# Patient Record
Sex: Female | Born: 1945 | Race: White | Hispanic: No | Smoking: Never smoker
Health system: Southern US, Community
[De-identification: ages and names within clinical notes are randomized; demographics above are authoritative.]

## PROBLEM LIST (undated history)

## (undated) DIAGNOSIS — I10 Essential (primary) hypertension: Secondary | ICD-10-CM

## (undated) DIAGNOSIS — F419 Anxiety disorder, unspecified: Secondary | ICD-10-CM

## (undated) DIAGNOSIS — E785 Hyperlipidemia, unspecified: Secondary | ICD-10-CM

## (undated) HISTORY — PX: TUBAL LIGATION: SHX77

## (undated) HISTORY — PX: BREAST SURGERY: SHX581

## (undated) HISTORY — DX: Hyperlipidemia, unspecified: E78.5

## (undated) HISTORY — PX: TONSILLECTOMY AND ADENOIDECTOMY: SUR1326

---

## 2005-05-30 ENCOUNTER — Ambulatory Visit: Payer: Self-pay

## 2005-10-04 ENCOUNTER — Ambulatory Visit: Payer: Self-pay | Admitting: Unknown Physician Specialty

## 2007-03-31 DIAGNOSIS — F32A Depression, unspecified: Secondary | ICD-10-CM | POA: Insufficient documentation

## 2007-04-22 ENCOUNTER — Ambulatory Visit: Payer: Self-pay

## 2008-04-01 ENCOUNTER — Observation Stay: Payer: Self-pay | Admitting: Internal Medicine

## 2008-04-01 ENCOUNTER — Other Ambulatory Visit: Payer: Self-pay

## 2008-04-22 ENCOUNTER — Ambulatory Visit: Payer: Self-pay

## 2008-05-25 LAB — HM DEXA SCAN: HM DEXA SCAN: NORMAL

## 2008-05-26 ENCOUNTER — Ambulatory Visit: Payer: Self-pay | Admitting: Family Medicine

## 2009-03-16 IMAGING — CT CT HEAD WITHOUT CONTRAST
2 series · 16 of 30 positions shown, 20 images · non-contrast
Comparison: none

REASON FOR EXAM: syncope
COMMENTS:

PROCEDURE:     CT  - CT HEAD WITHOUT CONTRAST  - April 01, 2008  [DATE]
RESULT:     Comparison: No available comparison exam.
Procedure: CT examination of the head was performed without intravenous
contrast. Collimation is 5 mm.

[Series 2: without · axial · non-contrast · 0.40mm/px · z∈[-142,-16]mm · 13 of 31 slices shown, 17 images]
[im 3/31  brain]
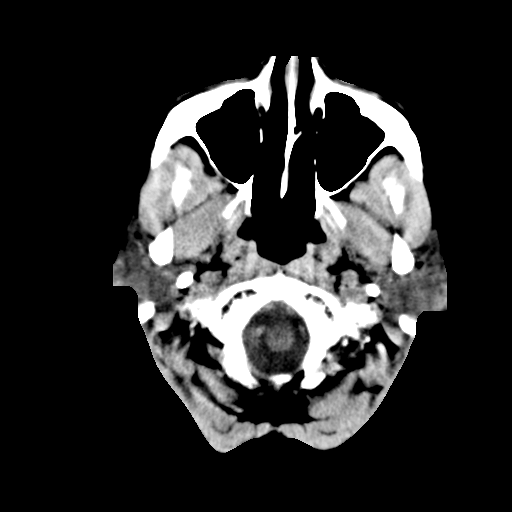
[im 3/31  bone]
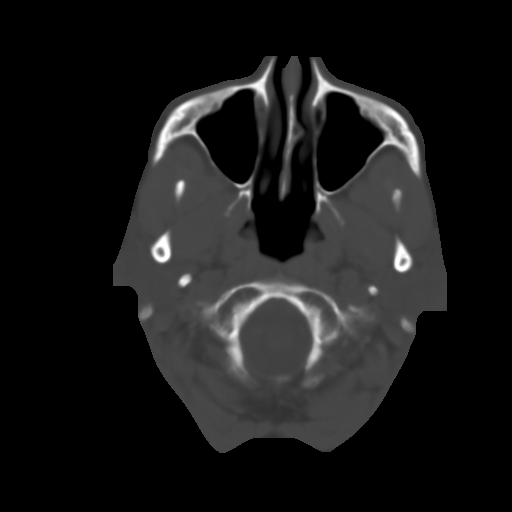
[im 5/31  brain]
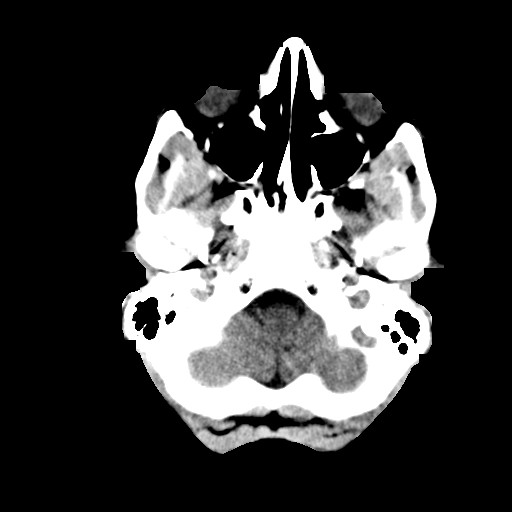
[im 7/31  brain]
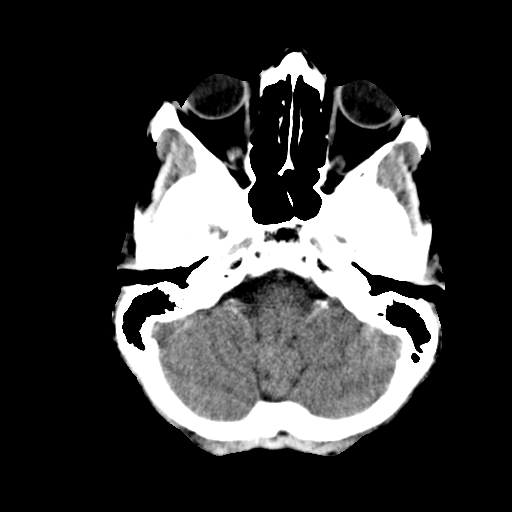
[im 9/31  brain]
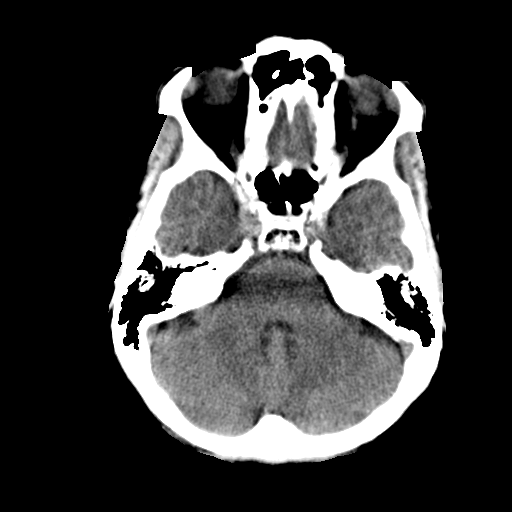
[im 11/31  brain]
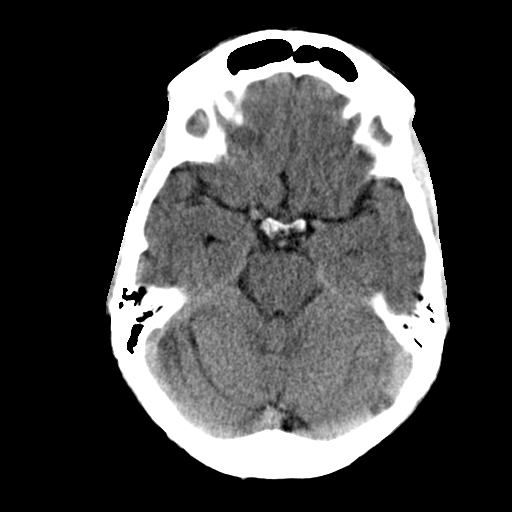
[im 11/31  bone]
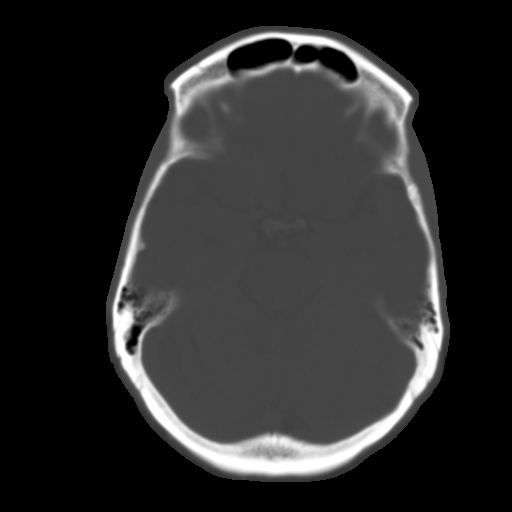
[im 13/31  brain]
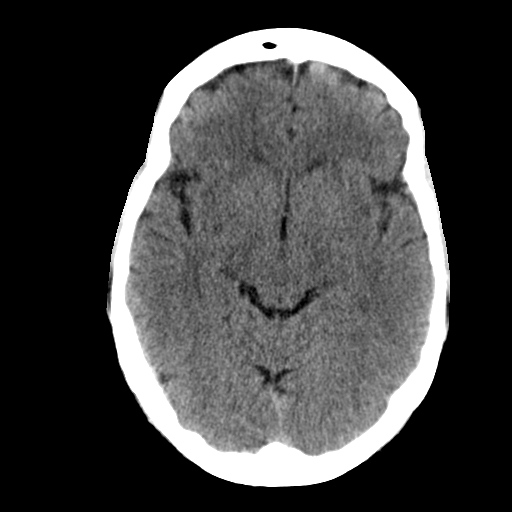
[im 16/31  brain]
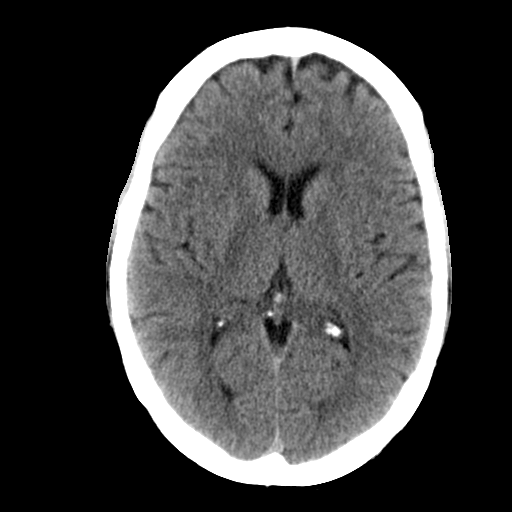
[im 18/31  brain]
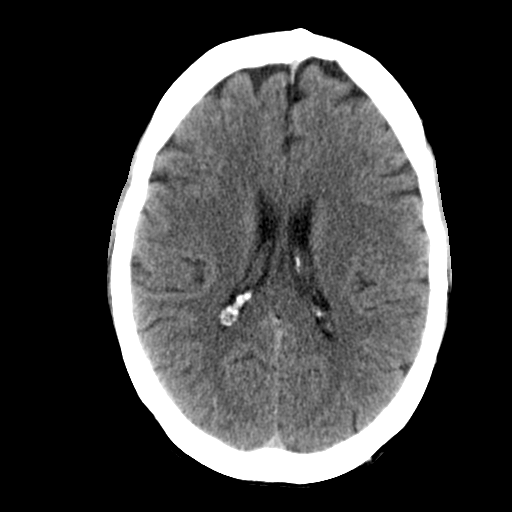
[im 20/31  brain]
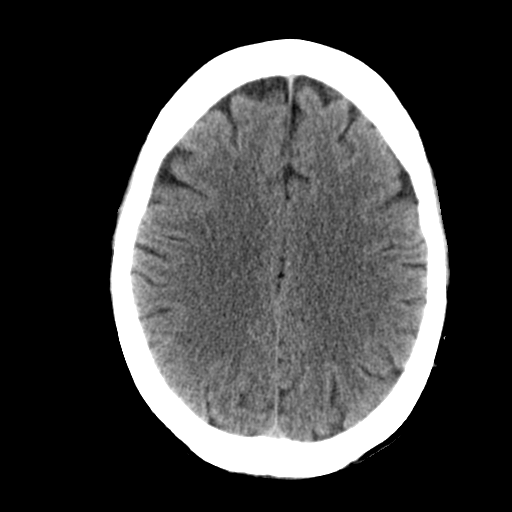
[im 20/31  bone]
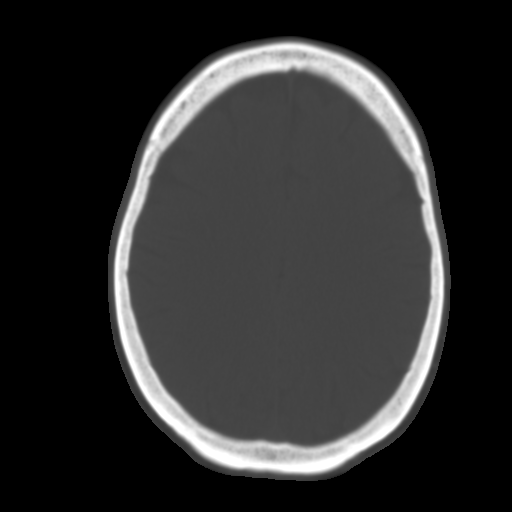
[im 22/31  brain]
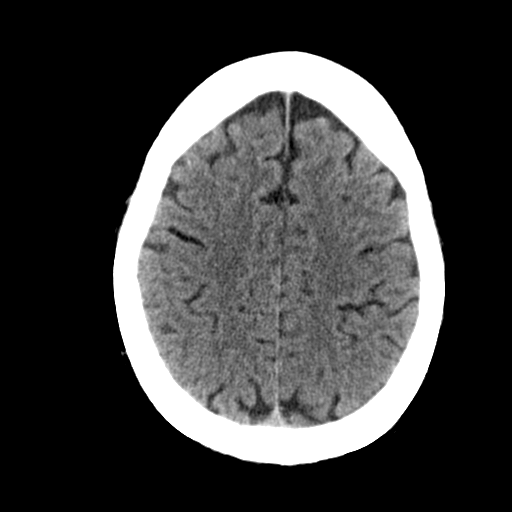
[im 24/31  brain]
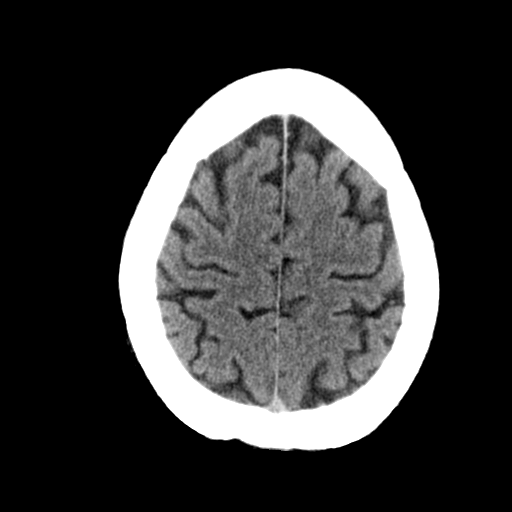
[im 26/31  brain]
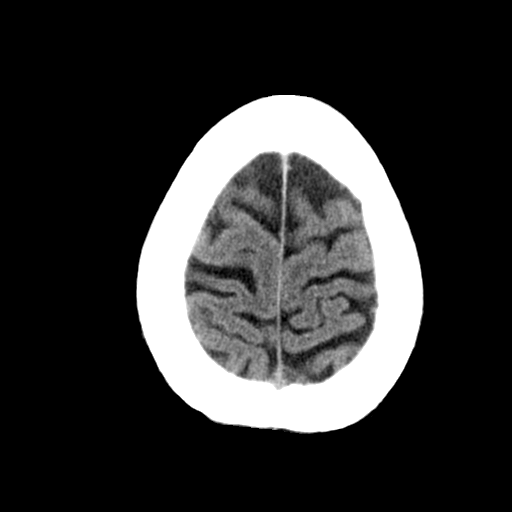
[im 28/31  brain]
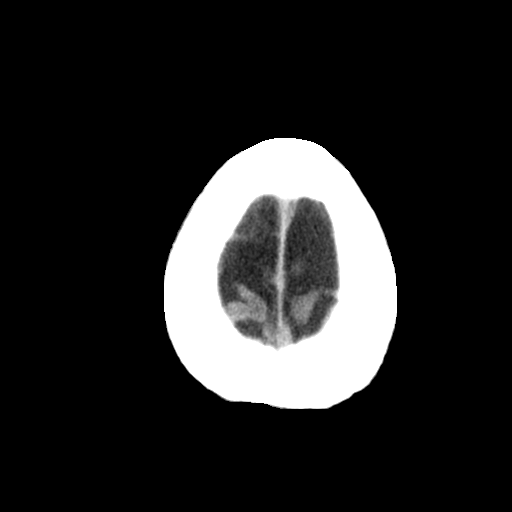
[im 28/31  bone]
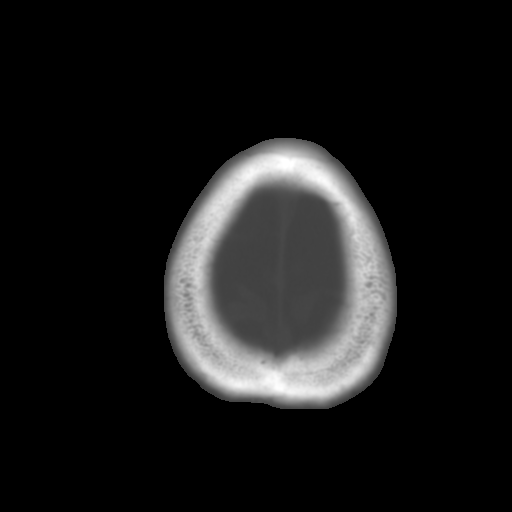

[Series 3: bone · axial · 0.39mm/px · z∈[-142,-102]mm · 3 of 31 slices shown]
[im 3/31  bone]
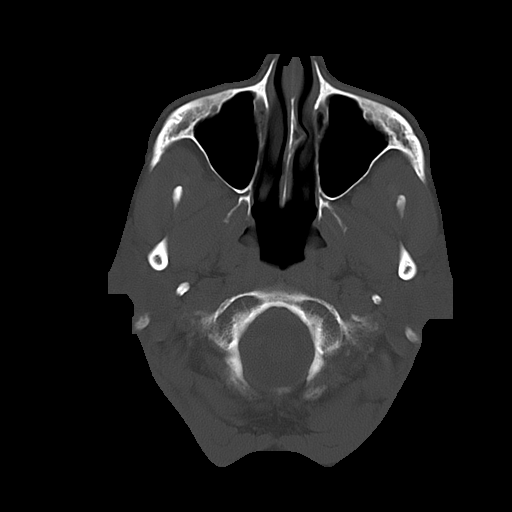
[im 7/31  bone]
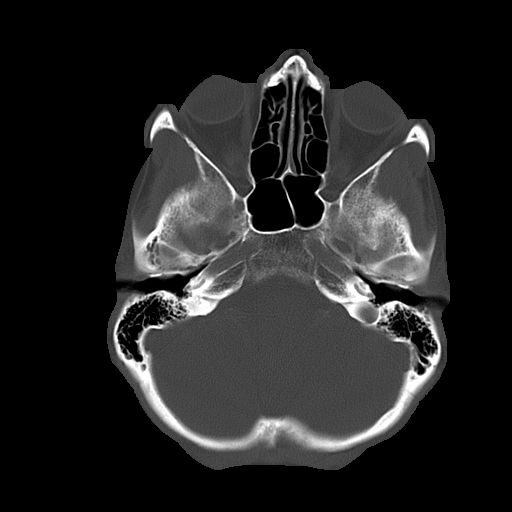
[im 11/31  bone]
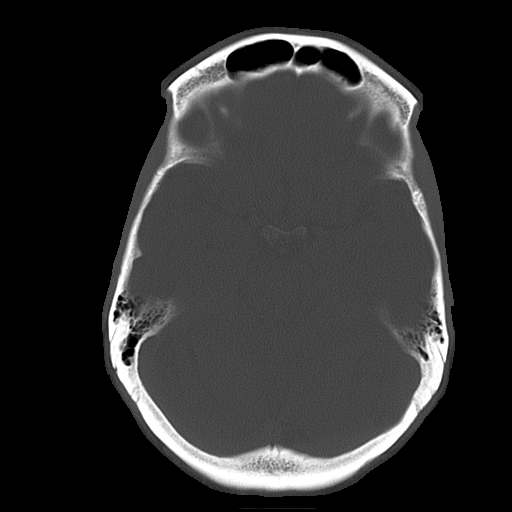

[16 of 30 positions shown; findings below may reference images not displayed]

FINDINGS: No evidence of intracranial hemorrhage, mass-effect, or ventricular
dilatation. The gray and white matters are differentiated. No displaced
calvarial fracture is noted. The partially visualized paranasal sinuses and
mastoid air cells are unremarkable.
IMPRESSION: 1. Unremarkable noncontrast CT of the head.

## 2009-03-16 IMAGING — CR DG CHEST 2V
1 series · 2 of 2 positions shown · non-contrast
Comparison: none

REASON FOR EXAM: Syncope
COMMENTS:

PROCEDURE:     DXR - DXR CHEST PA (OR AP) AND LATERAL  - April 01, 2008  [DATE]
RESULT:     The lung fields are clear. The heart, mediastinal and osseous
structures show no significant abnormalities.

[Series 1: view not recorded · 0.17mm/px · 2 of 2 slices shown]
[im 1/2]
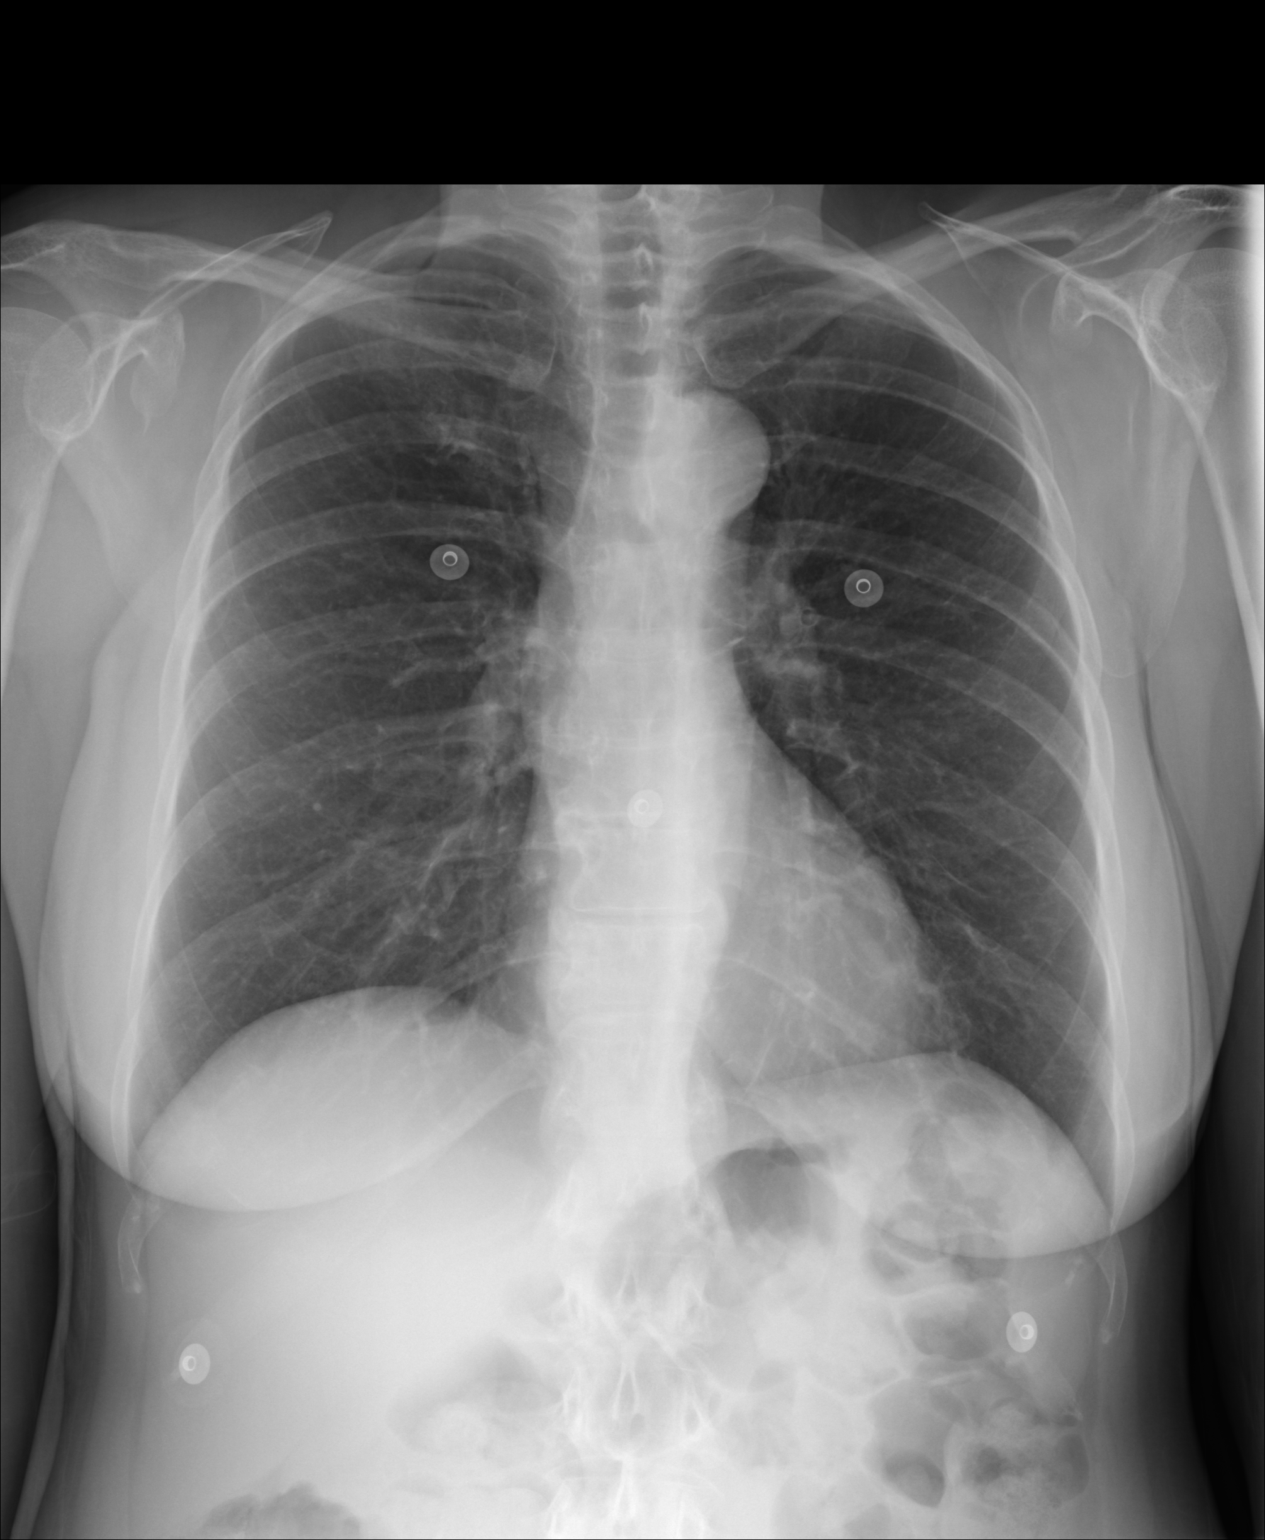
[im 2/2]
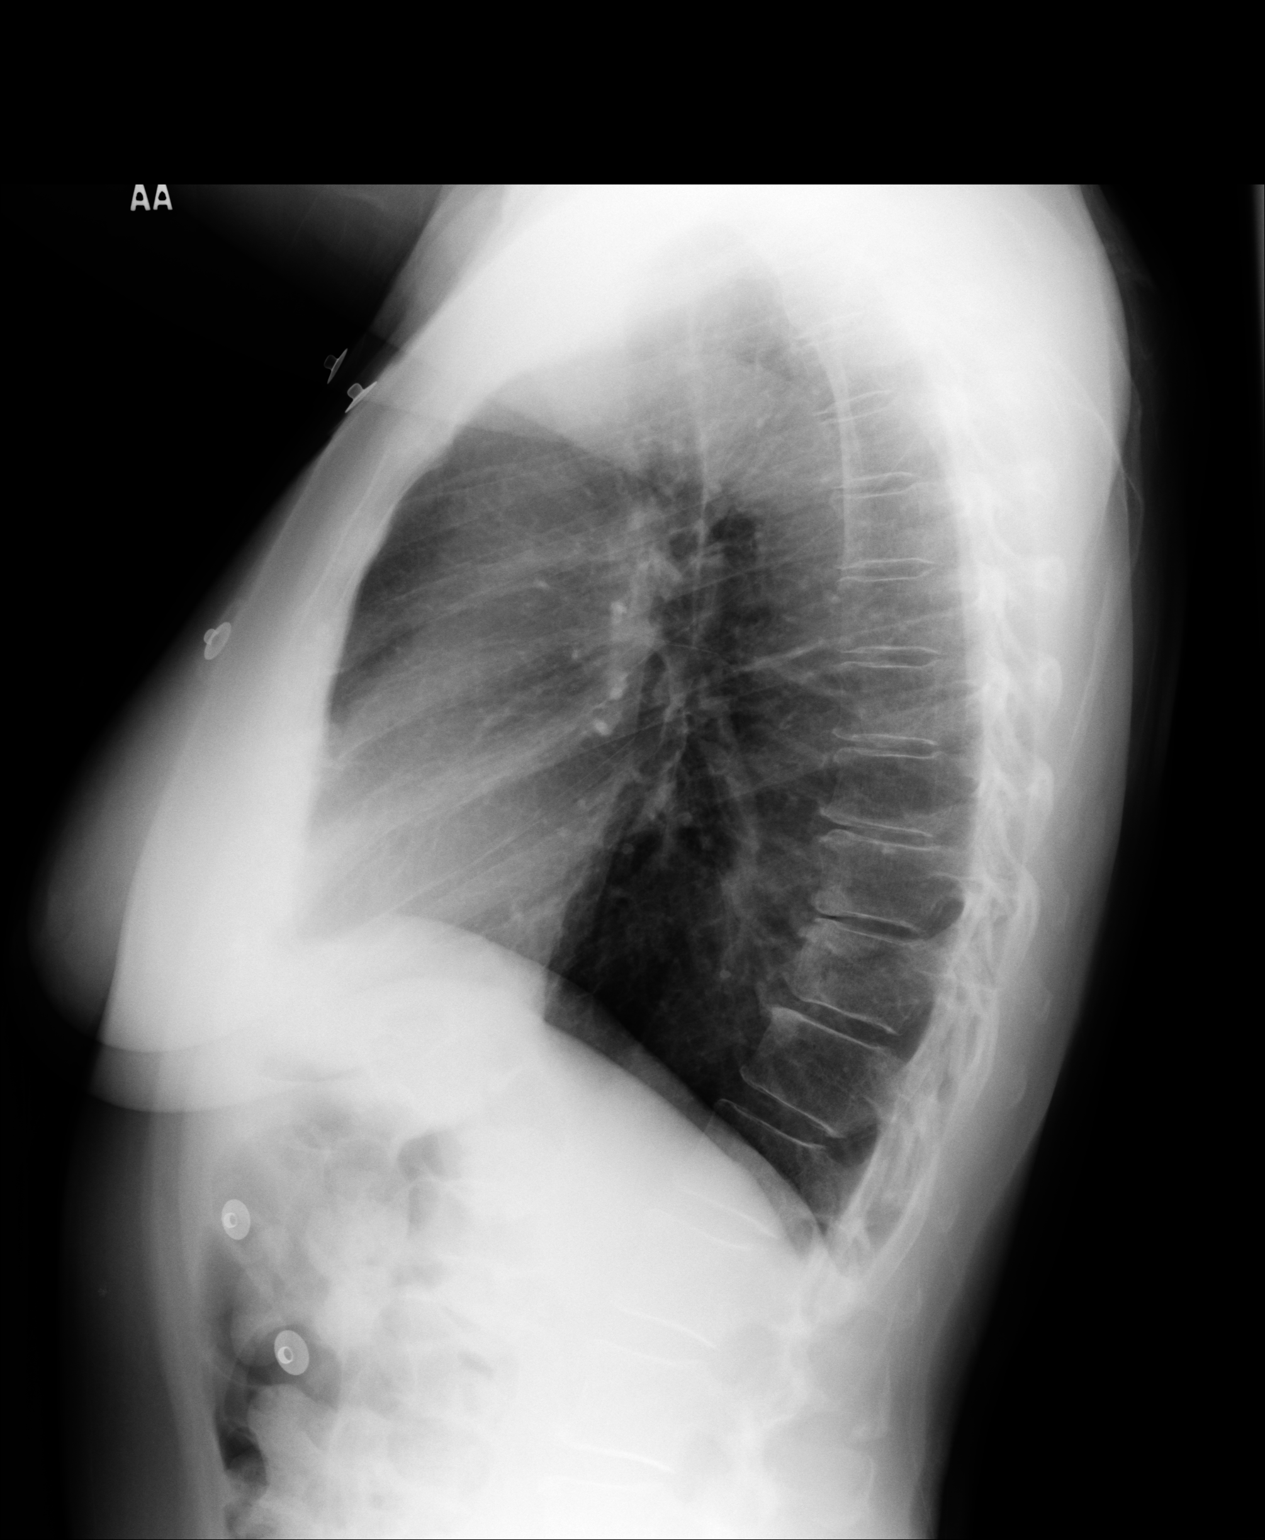

[2 of 2 positions shown; findings below may reference images not displayed]

IMPRESSION: No acute changes are identified.

## 2009-08-18 ENCOUNTER — Ambulatory Visit: Payer: Self-pay | Admitting: Family Medicine

## 2009-08-24 ENCOUNTER — Ambulatory Visit: Payer: Self-pay | Admitting: Family Medicine

## 2009-12-07 LAB — HM COLONOSCOPY

## 2010-11-26 ENCOUNTER — Ambulatory Visit: Payer: Self-pay | Admitting: Family Medicine

## 2012-07-21 ENCOUNTER — Ambulatory Visit: Payer: Self-pay | Admitting: Family Medicine

## 2012-07-23 ENCOUNTER — Ambulatory Visit: Payer: Self-pay | Admitting: Family Medicine

## 2013-09-08 ENCOUNTER — Ambulatory Visit: Payer: Self-pay | Admitting: Family Medicine

## 2013-09-08 LAB — HM MAMMOGRAPHY

## 2014-04-12 ENCOUNTER — Other Ambulatory Visit: Payer: Self-pay | Admitting: Family Medicine

## 2014-04-12 LAB — CBC WITH DIFFERENTIAL/PLATELET
Basophil #: 0 10*3/uL (ref 0.0–0.1)
Basophil %: 0.3 %
Eosinophil #: 0.1 10*3/uL (ref 0.0–0.7)
Eosinophil %: 1.4 %
HCT: 45.7 % (ref 35.0–47.0)
HGB: 15.1 g/dL (ref 12.0–16.0)
LYMPHS ABS: 1.1 10*3/uL (ref 1.0–3.6)
LYMPHS PCT: 18.1 %
MCH: 32 pg (ref 26.0–34.0)
MCHC: 33.1 g/dL (ref 32.0–36.0)
MCV: 97 fL (ref 80–100)
MONOS PCT: 4.2 %
Monocyte #: 0.3 x10 3/mm (ref 0.2–0.9)
Neutrophil #: 4.8 10*3/uL (ref 1.4–6.5)
Neutrophil %: 76 %
Platelet: 193 10*3/uL (ref 150–440)
RBC: 4.73 10*6/uL (ref 3.80–5.20)
RDW: 13.1 % (ref 11.5–14.5)
WBC: 6.3 10*3/uL (ref 3.6–11.0)

## 2014-04-12 LAB — COMPREHENSIVE METABOLIC PANEL
ALBUMIN: 4.3 g/dL (ref 3.4–5.0)
Alkaline Phosphatase: 95 U/L
Anion Gap: 5 — ABNORMAL LOW (ref 7–16)
BUN: 15 mg/dL (ref 7–18)
Bilirubin,Total: 0.5 mg/dL (ref 0.2–1.0)
CALCIUM: 9.3 mg/dL (ref 8.5–10.1)
Chloride: 101 mmol/L (ref 98–107)
Co2: 33 mmol/L — ABNORMAL HIGH (ref 21–32)
Creatinine: 0.69 mg/dL (ref 0.60–1.30)
EGFR (African American): >60
EGFR (Non-African Amer.): >60
GLUCOSE: 99 mg/dL (ref 65–99)
Osmolality: 278 (ref 275–301)
POTASSIUM: 4.3 mmol/L (ref 3.5–5.1)
SGOT(AST): 31 U/L (ref 15–37)
SGPT (ALT): 28 U/L (ref 12–78)
Sodium: 139 mmol/L (ref 136–145)
Total Protein: 7.6 g/dL (ref 6.4–8.2)

## 2014-04-12 LAB — LIPID PANEL
CHOLESTEROL: 178 mg/dL (ref 0–200)
HDL: 53 mg/dL (ref 40–60)
Ldl Cholesterol, Calc: 105 mg/dL — ABNORMAL HIGH (ref 0–100)
TRIGLYCERIDES: 102 mg/dL (ref 0–200)
VLDL Cholesterol, Calc: 20 mg/dL (ref 5–40)

## 2014-04-12 LAB — TSH: Thyroid Stimulating Horm: 1.23 u[IU]/mL

## 2015-08-01 ENCOUNTER — Encounter: Payer: Self-pay | Admitting: Emergency Medicine

## 2015-08-01 DIAGNOSIS — R197 Diarrhea, unspecified: Secondary | ICD-10-CM | POA: Diagnosis present

## 2015-08-01 DIAGNOSIS — I10 Essential (primary) hypertension: Secondary | ICD-10-CM | POA: Insufficient documentation

## 2015-08-01 DIAGNOSIS — A09 Infectious gastroenteritis and colitis, unspecified: Secondary | ICD-10-CM | POA: Diagnosis not present

## 2015-08-01 LAB — CBC WITH DIFFERENTIAL/PLATELET
BASOS ABS: 0 10*3/uL (ref 0–0.1)
Basophils Relative: 0 %
Eosinophils Absolute: 0 10*3/uL (ref 0–0.7)
Eosinophils Relative: 0 %
HCT: 49 % — ABNORMAL HIGH (ref 35.0–47.0)
Hemoglobin: 16.4 g/dL — ABNORMAL HIGH (ref 12.0–16.0)
LYMPHS PCT: 8 %
Lymphs Abs: 0.9 10*3/uL — ABNORMAL LOW (ref 1.0–3.6)
MCH: 31.6 pg (ref 26.0–34.0)
MCHC: 33.6 g/dL (ref 32.0–36.0)
MCV: 94 fL (ref 80.0–100.0)
Monocytes Absolute: 0.9 10*3/uL (ref 0.2–0.9)
Monocytes Relative: 7 %
NEUTROS PCT: 85 %
Neutro Abs: 10.2 10*3/uL — ABNORMAL HIGH (ref 1.4–6.5)
PLATELETS: 269 10*3/uL (ref 150–440)
RBC: 5.21 MIL/uL — ABNORMAL HIGH (ref 3.80–5.20)
RDW: 12.9 % (ref 11.5–14.5)
WBC: 12 10*3/uL — ABNORMAL HIGH (ref 3.6–11.0)

## 2015-08-01 LAB — COMPREHENSIVE METABOLIC PANEL
ALT: 23 U/L (ref 14–54)
AST: 29 U/L (ref 15–41)
Albumin: 4.4 g/dL (ref 3.5–5.0)
Alkaline Phosphatase: 87 U/L (ref 38–126)
Anion gap: 13 (ref 5–15)
BUN: 12 mg/dL (ref 6–20)
CHLORIDE: 92 mmol/L — AB (ref 101–111)
CO2: 28 mmol/L (ref 22–32)
Calcium: 9 mg/dL (ref 8.9–10.3)
Creatinine, Ser: 0.65 mg/dL (ref 0.44–1.00)
GFR calc Af Amer: >60 mL/min (ref >60)
Glucose, Bld: 185 mg/dL — ABNORMAL HIGH (ref 65–99)
Potassium: 3 mmol/L — ABNORMAL LOW (ref 3.5–5.1)
Sodium: 133 mmol/L — ABNORMAL LOW (ref 135–145)
Total Bilirubin: 0.5 mg/dL (ref 0.3–1.2)
Total Protein: 7.5 g/dL (ref 6.5–8.1)

## 2015-08-01 LAB — LIPASE, BLOOD: LIPASE: 27 U/L (ref 22–51)

## 2015-08-01 NOTE — ED Notes (Addendum)
Pt arrived to the ED for complaints of diarrhea which is bloody. Pt reports that her grand son has had diarrhea that has become bloody for 3 days now. Pt is AOx4 very anxious in triage. Dr. Manson Passey notified of the symptoms.

## 2015-08-02 ENCOUNTER — Emergency Department
Admission: EM | Admit: 2015-08-02 | Discharge: 2015-08-02 | Disposition: A | Discharge status: Home / Self Care | Payer: Medicare PPO | Attending: Emergency Medicine | Admitting: Emergency Medicine

## 2015-08-02 DIAGNOSIS — A09 Infectious gastroenteritis and colitis, unspecified: Secondary | ICD-10-CM

## 2015-08-02 HISTORY — DX: Essential (primary) hypertension: I10

## 2015-08-02 HISTORY — DX: Anxiety disorder, unspecified: F41.9

## 2015-08-02 MED ORDER — POTASSIUM CHLORIDE 20 MEQ/15ML (10%) PO SOLN
40.0000 meq | Freq: Once | ORAL | Status: AC
  Administered 2015-08-02: 40 meq via ORAL
  Filled 2015-08-02: qty 30

## 2015-08-02 NOTE — ED Notes (Signed)
Pt uprite on stretcher in exam room with no distress noted; daughter at bedside; pt reports 8-10 episodes of bloody diarrhea with no accomp symptoms; st grandson with recent same symptoms

## 2015-08-02 NOTE — ED Provider Notes (Signed)
[facility] Emergency Department Provider Note  ____________________________________________  Time seen: 2:00 AM  I have reviewed the triage vital signs and the nursing notes.   HISTORY  Chief Complaint Diarrhea      HPI Kathryn Morales is a 69 y.o. female presents with 8-10 episodes of blood the diarrhea with mucus times one day. Patient denies any abdominal pain no fever no lightheadedness. Of note patient has multiple sick contacts with same symptoms, namely her grandson and son-in-law.     Past Medical History  Diagnosis Date  . Anxiety   . Hypertension      Past surgical history None No current outpatient prescriptions on file.  Allergies No known drug allergies History reviewed. No pertinent family history.  Social History History  Substance Use Topics  . Smoking status: Never Smoker   . Smokeless tobacco: Not on file  . Alcohol Use: Yes    Review of Systems  Constitutional: Negative for fever. Eyes: Negative for visual changes. ENT: Negative for sore throat. Cardiovascular: Negative for chest pain. Respiratory: Negative for shortness of breath. Gastrointestinal: Positive for diarrhea. Genitourinary: Negative for dysuria. Musculoskeletal: Negative for back pain. Skin: Negative for rash. Neurological: Negative for headaches, focal weakness or numbness.   10-point ROS otherwise negative.  ____________________________________________   PHYSICAL EXAM:  VITAL SIGNS: ED Triage Vitals  Enc Vitals Group     BP 08/01/15 2138 185/103 mmHg     Pulse Rate 08/01/15 2138 81     Resp 08/01/15 2138 18     Temp 08/01/15 2138 98.9 F (37.2 C)     Temp Source 08/01/15 2138 Oral     SpO2 08/01/15 2138 96 %     Weight 08/01/15 2138 125 lb (56.7 kg)     Height 08/01/15 2138  (1.626 m)     Head Cir --      Peak Flow --      Pain Score 08/01/15 2142 0     Pain Loc --      Pain Edu? --      Excl. in GC? --       Constitutional: Alert and oriented. Well appearing and in no distress. Eyes: Conjunctivae are normal. PERRL. Normal extraocular movements. ENT   Head: Normocephalic and atraumatic.   Nose: No congestion/rhinnorhea.   Mouth/Throat: Mucous membranes are moist.   Neck: No stridor. Cardiovascular: Normal rate, regular rhythm. Normal and symmetric distal pulses are present in all extremities. No murmurs, rubs, or gallops. Respiratory: Normal respiratory effort without tachypnea nor retractions. Breath sounds are clear and equal bilaterally. No wheezes/rales/rhonchi. Gastrointestinal: Soft and nontender. No distention. There is no CVA tenderness. Genitourinary: deferred Musculoskeletal: Nontender with normal range of motion in all extremities. No joint effusions.  No lower extremity tenderness nor edema. Neurologic:  Normal speech and language. No gross focal neurologic deficits are appreciated. Speech is normal.  Skin:  Skin is warm, dry and intact. No rash noted. Psychiatric: Mood and affect are normal. Speech and behavior are normal. Patient exhibits appropriate insight and judgment.  ____________________________________________    LABS (pertinent positives/negatives)  Labs Reviewed  CBC WITH DIFFERENTIAL/PLATELET - Abnormal; Notable for the following:    WBC 12.0 (*)    RBC 5.21 (*)    Hemoglobin 16.4 (*)    HCT 49.0 (*)    Neutro Abs 10.2 (*)    Lymphs Abs 0.9 (*)    All other components within normal limits  COMPREHENSIVE METABOLIC PANEL - Abnormal; Notable for the  following:    Sodium 133 (*)    Potassium 3.0 (*)    Chloride 92 (*)    Glucose, Bld 185 (*)    All other components within normal limits  STOOL CULTURE  LIPASE, BLOOD     _  INITIAL IMPRESSION / ASSESSMENT AND PLAN / ED COURSE  Pertinent labs & imaging results that were available during my care of the patient were reviewed by me and considered in my medical decision making (see chart for  details).  Given absence of abdominal pain on exam CT scan of the abdomen was not performed. Patient and her daughter were notified to return to emergency department immediately if she starts to experience fever or abdominal pain. Infectious diarrhea likely etiology for patient's bloody diarrhea. Stool cultures were obtained and will await culture results  ____________________________________________   FINAL CLINICAL IMPRESSION(S) / ED DIAGNOSES  Final diagnoses:  Infectious diarrhea in adult patient      Darci Current, MD 08/02/15 [phone]

## 2015-08-02 NOTE — Discharge Instructions (Signed)

## 2015-08-04 LAB — STOOL CULTURE

## 2016-02-14 ENCOUNTER — Other Ambulatory Visit: Payer: Self-pay

## 2016-02-14 DIAGNOSIS — E7800 Pure hypercholesterolemia, unspecified: Secondary | ICD-10-CM | POA: Insufficient documentation

## 2016-02-14 MED ORDER — SIMVASTATIN 40 MG PO TABS: 40.0000 mg | ORAL_TABLET | Freq: Every day | ORAL | Status: DC

## 2016-02-14 NOTE — Telephone Encounter (Signed)
04/08/14 last ov. Pharmacy requesting refill.

## 2016-05-07 ENCOUNTER — Encounter: Payer: Self-pay | Admitting: Family Medicine

## 2016-05-07 ENCOUNTER — Ambulatory Visit (INDEPENDENT_AMBULATORY_CARE_PROVIDER_SITE_OTHER): Payer: Medicare Other | Admitting: Family Medicine

## 2016-05-07 VITALS — BP 154/78 | HR 64 | Temp 98.1°F | Resp 16 | Wt 134.0 lb

## 2016-05-07 DIAGNOSIS — I6529 Occlusion and stenosis of unspecified carotid artery: Secondary | ICD-10-CM | POA: Insufficient documentation

## 2016-05-07 DIAGNOSIS — E785 Hyperlipidemia, unspecified: Secondary | ICD-10-CM

## 2016-05-07 DIAGNOSIS — F419 Anxiety disorder, unspecified: Secondary | ICD-10-CM

## 2016-05-07 DIAGNOSIS — R413 Other amnesia: Secondary | ICD-10-CM | POA: Diagnosis not present

## 2016-05-07 DIAGNOSIS — F418 Other specified anxiety disorders: Secondary | ICD-10-CM

## 2016-05-07 DIAGNOSIS — F329 Major depressive disorder, single episode, unspecified: Secondary | ICD-10-CM | POA: Insufficient documentation

## 2016-05-07 MED ORDER — PAROXETINE HCL 40 MG PO TABS
20.0000 mg | ORAL_TABLET | Freq: Every day | ORAL | Status: DC
Start: 2016-05-07 — End: 2016-07-22

## 2016-05-07 MED ORDER — SIMVASTATIN 40 MG PO TABS: 40.0000 mg | ORAL_TABLET | Freq: Every day | ORAL | Status: DC

## 2016-05-07 MED ORDER — DONEPEZIL HCL 5 MG PO TABS: 5.0000 mg | ORAL_TABLET | Freq: Every day | ORAL | Status: DC

## 2016-05-07 NOTE — Progress Notes (Signed)
Patient ID: Kathryn Morales, female   DOB: November 30, 1946, 70 y.o.   MRN: [id number]   Patient: Kathryn HampshireFrances Y Morales Female    DOB: November 30, 1946   70 y.o.   MRN: [id number] Visit Date: 05/07/2016  Today's Provider: Dortha Kernennis Chrismon, PA   Chief Complaint  Patient presents with  . Anxiety  . Memory Loss   Subjective:    Anxiety Presents for follow-up visit. Symptoms include confusion, decreased concentration, depressed mood (sometimes), excessive worry, nervous/anxious behavior, obsessions and restlessness. Insomnia: sometimes. Symptoms occur most days. The severity of symptoms is interfering with daily activities. The quality of sleep is fair.   Treatments tried: Paxil. The treatment provided no relief. Compliance with prior treatments has been good.   Decreased Memory:  Patient's daughter reports patient has always been "scattered" but memory loss has been worse in the past month.  Past Medical History  Diagnosis Date  . Anxiety   . Hypertension    Past Surgical History  Procedure Laterality Date  . Tubal ligation    . Breast surgery    . Tonsillectomy and adenoidectomy     Family History  Problem Relation Age of Onset  . Coronary artery disease Mother   . Heart disease Mother   . Lupus Sister   . Diabetes Brother   . Asthma Maternal Grandfather   . Breast cancer Paternal Grandmother   . Heart attack Paternal Grandfather   . Hypothyroidism Daughter   . Anxiety disorder Daughter   . Hypothyroidism Maternal Grandmother    Previous Medications   PAROXETINE (PAXIL) 40 MG TABLET    Take by mouth.   SIMVASTATIN (ZOCOR) 40 MG TABLET    Take 1 tablet (40 mg total) by mouth at bedtime.   No Known Allergies  Review of Systems  Constitutional: Positive for activity change.  HENT: Negative.   Eyes: Negative.   Respiratory: Negative.   Cardiovascular: Negative.   Gastrointestinal: Negative.   Endocrine: Negative.   Musculoskeletal: Negative.   Skin: Negative.   Allergic/Immunologic:  Negative.   Neurological: Negative.   Hematological: Negative.   Psychiatric/Behavioral: Positive for confusion, sleep disturbance and decreased concentration. The patient is nervous/anxious. Insomnia: sometimes.        Decreased memory    Social History  Substance Use Topics  . Smoking status: Never Smoker   . Smokeless tobacco: Not on file  . Alcohol Use: Yes   Objective:   BP 154/78 mmHg  Pulse 64  Temp(Src) 98.1 F (36.7 C) (Oral)  Resp 16  Wt 134 lb (60.782 kg)  Physical Exam  Constitutional: She appears well-developed and well-nourished.  HENT:  Head: Normocephalic and atraumatic.  Right Ear: External ear normal.  Left Ear: External ear normal.  Nose: Nose normal.  Mouth/Throat: Oropharynx is clear and moist.  Eyes: Conjunctivae and EOM are normal. Pupils are equal, round, and reactive to light. Right eye exhibits no discharge.  Neck: Normal range of motion. Neck supple. No tracheal deviation present. No thyromegaly present.  Cardiovascular: Normal rate, regular rhythm, normal heart sounds and intact distal pulses.   No murmur heard. Pulmonary/Chest: Effort normal and breath sounds normal. No respiratory distress. She has no wheezes. She has no rales. She exhibits no tenderness.  Abdominal: Soft. She exhibits no distension and no mass. There is no tenderness. There is no rebound and no guarding.  Musculoskeletal: Normal range of motion. She exhibits no edema or tenderness.  Lymphadenopathy:    She has no cervical adenopathy.  Neurological: She is alert.  She has normal reflexes. No cranial nerve deficit. She exhibits normal muscle tone. Coordination normal.  Skin: Skin is warm and dry. No rash noted. No erythema.  Psychiatric: Her speech is normal and behavior is normal. Thought content normal. Her mood appears anxious. Cognition and memory are impaired.   Cognitive Testing - 6-CIT  Correct? Score   What year is it? no 4 0 or 4  What month is it? no 2 0 or 3    Memorize:    Floyde Parkins,  42,  [address],  [place],      What time is it? (within 1 hour) yes 0 0 or 3  Count backwards from 20 yes 0 0, 2, or 4  Name the months of the year no 1 0, 2, or 4  Repeat name & address above no 8 0, 2, 4, 6, 8, or 10       TOTAL SCORE  15/28   Interpretation:  Abnormal- Poor recall with anxiety and history of depression.  Normal (0-7) Abnormal (8-28)    Assessment & Plan:     1. Impaired memory Family noticing some confusion and impaired memory. She has decided to discontinue driving. Tearful during interview today but interactive. Will start Aricept and get routine labs. May need referral to neurologist pending reports. - CBC with Differential/Platelet - Comprehensive metabolic panel - TSH - donepezil (ARICEPT) 5 MG tablet; Take 1 tablet (5 mg total) by mouth at bedtime.  Dispense: 30 tablet; Refill: 3  2. Anxiety and depression Has not been taking Paxil regularly recently. With possible interaction with the Aricept, will taper back to 20 mg qd. Pending lab reports, may need consider further drop in dosage to try to help with cognition. Recheck prn. - PARoxetine (PAXIL) 40 MG tablet; Take 0.5 tablets (20 mg total) by mouth daily.  Dispense: 30 tablet; Refill: 3  3. HLD (hyperlipidemia) Tolerating the Simvastatin without side effects. Recheck CMP, TSH and lipids. Follow up pending lab reports. - Comprehensive metabolic panel - Lipid panel - TSH - simvastatin (ZOCOR) 40 MG tablet; Take 1 tablet (40 mg total) by mouth at bedtime.  Dispense: 30 tablet; Refill: 3

## 2016-05-07 NOTE — Patient Instructions (Signed)

## 2016-05-09 ENCOUNTER — Other Ambulatory Visit: Payer: Self-pay

## 2016-05-09 DIAGNOSIS — E785 Hyperlipidemia, unspecified: Secondary | ICD-10-CM

## 2016-05-09 MED ORDER — SIMVASTATIN 40 MG PO TABS: 40.0000 mg | ORAL_TABLET | Freq: Every day | ORAL | Status: DC

## 2016-05-10 LAB — LIPID PANEL
CHOLESTEROL TOTAL: 177 mg/dL (ref 100–199)
Chol/HDL Ratio: 3.3 ratio units (ref 0.0–4.4)
HDL: 54 mg/dL (ref >39)
LDL Calculated: 104 mg/dL — ABNORMAL HIGH (ref 0–99)
Triglycerides: 97 mg/dL (ref 0–149)
VLDL CHOLESTEROL CAL: 19 mg/dL (ref 5–40)

## 2016-05-10 LAB — CBC WITH DIFFERENTIAL/PLATELET
BASOS: 1 %
Basophils Absolute: 0 10*3/uL (ref 0.0–0.2)
EOS (ABSOLUTE): 0.1 10*3/uL (ref 0.0–0.4)
EOS: 3 %
HEMATOCRIT: 44.2 % (ref 34.0–46.6)
HEMOGLOBIN: 15 g/dL (ref 11.1–15.9)
IMMATURE GRANS (ABS): 0 10*3/uL (ref 0.0–0.1)
IMMATURE GRANULOCYTES: 0 %
LYMPHS: 33 %
Lymphocytes Absolute: 1.4 10*3/uL (ref 0.7–3.1)
MCH: 32.1 pg (ref 26.6–33.0)
MCHC: 33.9 g/dL (ref 31.5–35.7)
MCV: 95 fL (ref 79–97)
Monocytes Absolute: 0.3 10*3/uL (ref 0.1–0.9)
Monocytes: 7 %
NEUTROS ABS: 2.5 10*3/uL (ref 1.4–7.0)
NEUTROS PCT: 56 %
Platelets: 201 10*3/uL (ref 150–379)
RBC: 4.67 x10E6/uL (ref 3.77–5.28)
RDW: 13.7 % (ref 12.3–15.4)
WBC: 4.4 10*3/uL (ref 3.4–10.8)

## 2016-05-10 LAB — COMPREHENSIVE METABOLIC PANEL
ALK PHOS: 85 IU/L (ref 39–117)
ALT: 21 IU/L (ref 0–32)
AST: 24 IU/L (ref 0–40)
Albumin/Globulin Ratio: 2 (ref 1.2–2.2)
Albumin: 4.5 g/dL (ref 3.6–4.8)
BUN/Creatinine Ratio: 21 (ref 12–28)
BUN: 19 mg/dL (ref 8–27)
Bilirubin Total: 0.4 mg/dL (ref 0.0–1.2)
CHLORIDE: 100 mmol/L (ref 96–106)
CO2: 25 mmol/L (ref 18–29)
Calcium: 9.4 mg/dL (ref 8.7–10.3)
Creatinine, Ser: 0.91 mg/dL (ref 0.57–1.00)
GFR calc Af Amer: 74 mL/min/{1.73_m2} (ref >59)
GFR calc non Af Amer: 65 mL/min/{1.73_m2} (ref >59)
GLUCOSE: 95 mg/dL (ref 65–99)
Globulin, Total: 2.2 g/dL (ref 1.5–4.5)
Potassium: 4.2 mmol/L (ref 3.5–5.2)
Sodium: 143 mmol/L (ref 134–144)
Total Protein: 6.7 g/dL (ref 6.0–8.5)

## 2016-05-10 LAB — TSH: TSH: 1.56 u[IU]/mL (ref 0.450–4.500)

## 2016-05-13 ENCOUNTER — Telehealth: Payer: Self-pay

## 2016-05-13 DIAGNOSIS — R413 Other amnesia: Secondary | ICD-10-CM

## 2016-05-13 NOTE — Telephone Encounter (Signed)
Did not hear a bruit (altered blood flow) in the carotid arteries, but, the last report I have from the vascular specialist in 2010 did show some early stenosis. We should schedule carotid doppler/ultrasound to re-measure amount of stenosis. If above 70%, it certainly could make these symptoms occur, or at least worse. (Placed future order in chart for bilateral duplex of carotids).

## 2016-05-13 NOTE — Telephone Encounter (Signed)
-----   Message from Tamsen Roersennis E Chrismon, [place] sent at 05/10/2016  4:08 PM EDT ----- All blood tests in normal ranges. Continue cholesterol medication and low fat diet. Decrease Paroxetine to 20 mg qd and try to taper off. Proceed with the Aricept 5 mg qd and recheck response in a month. If worsening, will schedule neurology referral.

## 2016-05-13 NOTE — Telephone Encounter (Signed)
Advised patient's daughter as below. She was wondering why does the patient need to taper off of the Paroxetine being that she has been on that medication for years? She also reports that the patient has a history of a blocked carotid artery and wonders if this could be the reason why she is having these symptoms? Please advise. Thanks!

## 2016-05-14 NOTE — Telephone Encounter (Signed)
Insurance frequently recommends discontinuation of the Paroxetine in people over 65. Has the potential to cause some drowsiness or disturb balance (especially when used with medications for memory impairment). If it does not interact with the Aricept, may consider going back up to the usual dose. Medicare usually recommends discontinuing this drug with the elderly.

## 2016-05-14 NOTE — Telephone Encounter (Signed)
[facility]

## 2016-05-14 NOTE — Telephone Encounter (Signed)
Before calling patient can you let me know why patient needs to taper off Paroxetine that was a question from the daughter?-aa

## 2016-05-15 NOTE — Telephone Encounter (Signed)
Patient's daughter Orpha BurKaty advised as directed below. Order for US released. Daughter is requesting a appointment after June 9th. Sarah advised.   Orpha BurKaty reports that patient's symptoms are way better since starting the Aricept and taking the Paroxetine 40 mg daily on a regular basis. Per Orpha BurKaty patient is not drowsy or having disturbed balance.   Orpha BurKaty wants to know if patient can continue taking Paroxetine 40 mg. She is afraid if the dose is decreased patient's symptoms will worsen again. Please advise.

## 2016-05-16 NOTE — Telephone Encounter (Signed)
May continue Paroxetine at 40 mg qd for now. Hope to be able to get down to 20 mg qd with the addition of the Aricept. Proceed with ultrasound test and recheck as planned.

## 2016-05-17 NOTE — Telephone Encounter (Signed)
Oretha Milchkaty advised-aa

## 2016-06-03 ENCOUNTER — Other Ambulatory Visit: Payer: Self-pay | Admitting: Family Medicine

## 2016-06-03 DIAGNOSIS — E785 Hyperlipidemia, unspecified: Secondary | ICD-10-CM

## 2016-06-03 MED ORDER — SIMVASTATIN 40 MG PO TABS: 40.0000 mg | ORAL_TABLET | Freq: Every day | ORAL | Status: DC

## 2016-06-10 ENCOUNTER — Ambulatory Visit
Admission: RE | Admit: 2016-06-10 | Discharge: 2016-06-10 | Disposition: A | Discharge status: Home / Self Care | Payer: Medicare Other | Source: Ambulatory Visit | Attending: Family Medicine | Admitting: Family Medicine

## 2016-06-10 DIAGNOSIS — R413 Other amnesia: Secondary | ICD-10-CM | POA: Diagnosis not present

## 2016-06-10 DIAGNOSIS — I6523 Occlusion and stenosis of bilateral carotid arteries: Secondary | ICD-10-CM | POA: Diagnosis not present

## 2016-06-13 ENCOUNTER — Telehealth: Payer: Self-pay

## 2016-06-13 NOTE — Telephone Encounter (Signed)
-----   Message from Tamsen Roersennis E Chrismon, [place] sent at 06/10/2016  5:46 PM EDT ----- Ultrasounds of carotids shows 50-69% stenosis. No a serious compromise to blood flow. Recommend evaluation by vascular surgeon to be sure further tests or treatment not needed. If memory and confusion progressing despite use of Paroxetine with Aricept, will need evaluation by neurologist. (Let us know when they are ready for these referrals).

## 2016-06-13 NOTE — Telephone Encounter (Signed)
Pt's daughter returned call [phone].

## 2016-06-13 NOTE — Telephone Encounter (Signed)
Left message to call back (for Orpha BurKaty to advise of patient's results).

## 2016-06-14 NOTE — Telephone Encounter (Signed)
Left message to call back  

## 2016-06-14 NOTE — Telephone Encounter (Signed)
Advised daughter as below. She reports that she and the patient will schedule a F/U visit to discuss what the next plan should be.

## 2016-07-22 ENCOUNTER — Encounter: Payer: Self-pay | Admitting: Family Medicine

## 2016-07-22 ENCOUNTER — Ambulatory Visit (INDEPENDENT_AMBULATORY_CARE_PROVIDER_SITE_OTHER): Payer: Medicare Other | Admitting: Family Medicine

## 2016-07-22 VITALS — BP 126/70 | HR 60 | Temp 98.1°F | Resp 14 | Wt 137.0 lb

## 2016-07-22 DIAGNOSIS — E785 Hyperlipidemia, unspecified: Secondary | ICD-10-CM

## 2016-07-22 DIAGNOSIS — R413 Other amnesia: Secondary | ICD-10-CM

## 2016-07-22 DIAGNOSIS — F419 Anxiety disorder, unspecified: Secondary | ICD-10-CM

## 2016-07-22 DIAGNOSIS — F418 Other specified anxiety disorders: Secondary | ICD-10-CM | POA: Diagnosis not present

## 2016-07-22 DIAGNOSIS — Z111 Encounter for screening for respiratory tuberculosis: Secondary | ICD-10-CM | POA: Diagnosis not present

## 2016-07-22 DIAGNOSIS — F329 Major depressive disorder, single episode, unspecified: Secondary | ICD-10-CM

## 2016-07-22 MED ORDER — SIMVASTATIN 40 MG PO TABS: 40.0000 mg | ORAL_TABLET | Freq: Every day | ORAL | 3 refills | Status: AC

## 2016-07-22 MED ORDER — PAROXETINE HCL 20 MG PO TABS: 20.0000 mg | ORAL_TABLET | Freq: Every day | ORAL | 3 refills | Status: DC

## 2016-07-22 MED ORDER — DONEPEZIL HCL 5 MG PO TABS: 5.0000 mg | ORAL_TABLET | Freq: Every day | ORAL | 3 refills | Status: DC

## 2016-07-22 NOTE — Progress Notes (Signed)
Kathryn Morales  MRN: [id number] DOB: [birth year]  Subjective:  HPI   The patient is a 70 year old female who presents for follow up after being started on Donepezil in May.  Her daughters accompanies today and states that she has noticed a slight improvement in the patient.  She also has papers that need to be filled out for the patient to attended [facility].  Patient Active Problem List   Diagnosis Date Noted  . Anxiety and depression 05/07/2016  . Carotid artery narrowing 05/07/2016  . Hypercholesteremia 02/14/2016  . History of colon polyps 06/09/2008  . BP (high blood pressure) 02/23/2008  . ADD (attention deficit disorder) 06/16/2007  . Clinical depression 03/31/2007  . HLD (hyperlipidemia) 03/31/2007    Past Medical History:  Diagnosis Date  . Anxiety   . Hypertension     Social History   Social History  . Marital status: Widowed    Spouse name: N/A  . Number of children: N/A  . Years of education: N/A   Occupational History  . Not on file.   Social History Main Topics  . Smoking status: Never Smoker  . Smokeless tobacco: Not on file  . Alcohol use Yes  . Drug use: No  . Sexual activity: No   Other Topics Concern  . Not on file   Social History Narrative  . No narrative on file    Outpatient Medications Prior to Visit  Medication Sig Dispense Refill  . donepezil (ARICEPT) 5 MG tablet Take 1 tablet (5 mg total) by mouth at bedtime. 30 tablet 3  . PARoxetine (PAXIL) 40 MG tablet Take 0.5 tablets (20 mg total) by mouth daily. 30 tablet 3  . simvastatin (ZOCOR) 40 MG tablet Take 1 tablet (40 mg total) by mouth at bedtime. 90 tablet 3   No facility-administered medications prior to visit.     No Known Allergies  Review of Systems  Constitutional: Negative.   HENT: Negative.   Eyes: Negative.   Respiratory: Negative.   Cardiovascular: Negative.   Gastrointestinal: Negative.   Genitourinary: Negative.   Musculoskeletal:  Negative.   Skin: Negative.   Neurological: Negative.   Endo/Heme/Allergies: Negative.   Psychiatric/Behavioral: Positive for memory loss. The patient is nervous/anxious.    Objective:  BP 126/70   Pulse 60   Temp 98.1 F (36.7 C) (Oral)   Resp 14   Wt 137 lb (62.1 kg)   BMI 23.52 kg/m   Physical Exam  Constitutional: She is well-developed, well-nourished, and in no distress.  HENT:  Head: Normocephalic.  Eyes: Conjunctivae are normal.  Neck: Neck supple.  Cardiovascular: Normal rate and regular rhythm.   Pulmonary/Chest: Effort normal and breath sounds normal.  Abdominal: Soft. Bowel sounds are normal.  Musculoskeletal: Normal range of motion.  Neurological: She is alert.  Short term memory a little off. Bright spirits and seems to be accepting of little memory lapses.  Psychiatric: Affect normal.  Searching for correction or completion of thoughts from daughter. No crying spells or sadness today.    Assessment and Plan :   1. Impaired memory Short term memory impairment seems to be stabilized per family. Tolerating Aricept without side effects. Is attending adult daycare three times a week at [facility]. Will recheck labs and refill Aricept. Recheck in 3 months. Completed heath form for [facility]. - CBC with Differential/Platelet - Comprehensive metabolic panel - donepezil (ARICEPT) 5 MG tablet; Take 1 tablet (5 mg total) by mouth  at bedtime.  Dispense: 30 tablet; Refill: 3  2. HLD (hyperlipidemia) Tolerating Simvastatin and daughter gives it with her other medications a supper each day. Continue low fat diet restrictions and refilled Simvastatin. Recheck labs. - Comprehensive metabolic panel - Lipid panel - simvastatin (ZOCOR) 40 MG tablet; Take 1 tablet (40 mg total) by mouth at bedtime.  Dispense: 90 tablet; Refill: 3  3. Anxiety and depression Less anxiety and denies crying spells. Tolerating Paxil. At her age, will taper back to 20 mg qd and recheck in 3  months. - CBC with Differential/Platelet - Comprehensive metabolic panel - PARoxetine (PAXIL) 20 MG tablet; Take 1 tablet (20 mg total) by mouth daily.  Dispense: 30 tablet; Refill: 3  4. Screening-pulmonary TB - Quantiferon tb gold assay     [facility] 07/22/2016 8:19 AM

## 2016-07-27 LAB — CBC WITH DIFFERENTIAL/PLATELET
BASOS: 0 %
Basophils Absolute: 0 10*3/uL (ref 0.0–0.2)
EOS (ABSOLUTE): 0.1 10*3/uL (ref 0.0–0.4)
Eos: 2 %
HEMATOCRIT: 43.1 % (ref 34.0–46.6)
HEMOGLOBIN: 14.5 g/dL (ref 11.1–15.9)
IMMATURE GRANS (ABS): 0 10*3/uL (ref 0.0–0.1)
Immature Granulocytes: 0 %
LYMPHS: 31 %
Lymphocytes Absolute: 1.6 10*3/uL (ref 0.7–3.1)
MCH: 32 pg (ref 26.6–33.0)
MCHC: 33.6 g/dL (ref 31.5–35.7)
MCV: 95 fL (ref 79–97)
MONOCYTES: 6 %
Monocytes Absolute: 0.3 10*3/uL (ref 0.1–0.9)
NEUTROS ABS: 3.1 10*3/uL (ref 1.4–7.0)
Neutrophils: 61 %
Platelets: 195 10*3/uL (ref 150–379)
RBC: 4.53 x10E6/uL (ref 3.77–5.28)
RDW: 14.1 % (ref 12.3–15.4)
WBC: 5.1 10*3/uL (ref 3.4–10.8)

## 2016-07-27 LAB — COMPREHENSIVE METABOLIC PANEL
ALBUMIN: 4.4 g/dL (ref 3.6–4.8)
ALT: 20 IU/L (ref 0–32)
AST: 22 IU/L (ref 0–40)
Albumin/Globulin Ratio: 1.9 (ref 1.2–2.2)
Alkaline Phosphatase: 92 IU/L (ref 39–117)
BUN / CREAT RATIO: 28 (ref 12–28)
BUN: 23 mg/dL (ref 8–27)
Bilirubin Total: 0.3 mg/dL (ref 0.0–1.2)
CHLORIDE: 101 mmol/L (ref 96–106)
CO2: 29 mmol/L (ref 18–29)
Calcium: 9.6 mg/dL (ref 8.7–10.3)
Creatinine, Ser: 0.83 mg/dL (ref 0.57–1.00)
GFR calc non Af Amer: 72 mL/min/{1.73_m2} (ref >59)
GFR, EST AFRICAN AMERICAN: 83 mL/min/{1.73_m2} (ref >59)
GLOBULIN, TOTAL: 2.3 g/dL (ref 1.5–4.5)
Glucose: 95 mg/dL (ref 65–99)
Potassium: 5.2 mmol/L (ref 3.5–5.2)
SODIUM: 145 mmol/L — AB (ref 134–144)
TOTAL PROTEIN: 6.7 g/dL (ref 6.0–8.5)

## 2016-07-27 LAB — LIPID PANEL
Chol/HDL Ratio: 3.2 ratio units (ref 0.0–4.4)
Cholesterol, Total: 181 mg/dL (ref 100–199)
HDL: 56 mg/dL (ref >39)
LDL Calculated: 101 mg/dL — ABNORMAL HIGH (ref 0–99)
Triglycerides: 122 mg/dL (ref 0–149)
VLDL CHOLESTEROL CAL: 24 mg/dL (ref 5–40)

## 2016-07-27 LAB — QUANTIFERON IN TUBE
QFT TB AG MINUS NIL VALUE: 0.03 [IU]/mL
QUANTIFERON MITOGEN VALUE: 9.19 [IU]/mL
QUANTIFERON TB AG VALUE: 0.05 IU/mL
QUANTIFERON TB GOLD: NEGATIVE
Quantiferon Nil Value: 0.02 IU/mL

## 2016-07-27 LAB — QUANTIFERON TB GOLD ASSAY (BLOOD)

## 2016-10-10 IMAGING — US US CAROTID DUPLEX BILAT
1 series · 13 of 24 positions shown · non-contrast
Comparison: 08/18/2009

CLINICAL DATA: Memory problems.  Carotid plaque.

EXAM:
BILATERAL CAROTID DUPLEX ULTRASOUND
TECHNIQUE: Gray scale imaging, color Doppler and duplex ultrasound was
performed of bilateral carotid and vertebral arteries in the neck.
TECHNIQUE: Quantification of carotid stenosis is based on velocity parameters
that correlate the residual internal carotid diameter with
NASCET-based stenosis levels, using the diameter of the distal
internal carotid lumen as the denominator for stenosis measurement.

[Series 1: us carotid duplex bilat · 13 of 65 slices shown]
[im 1/65]
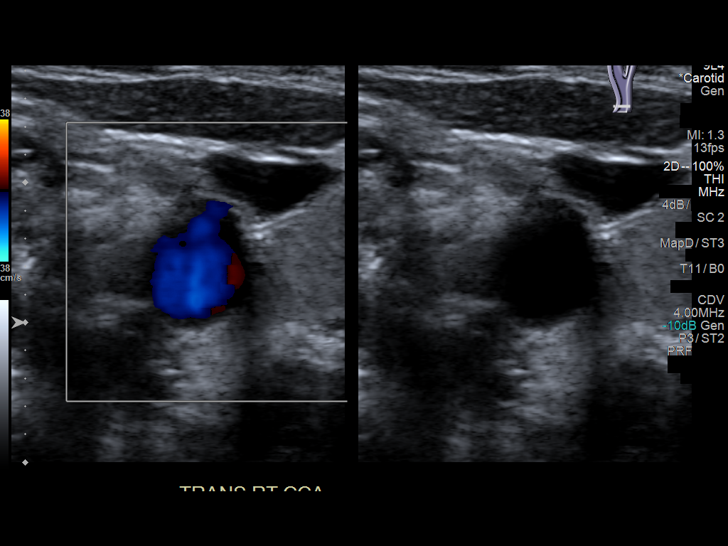
[im 6/65]
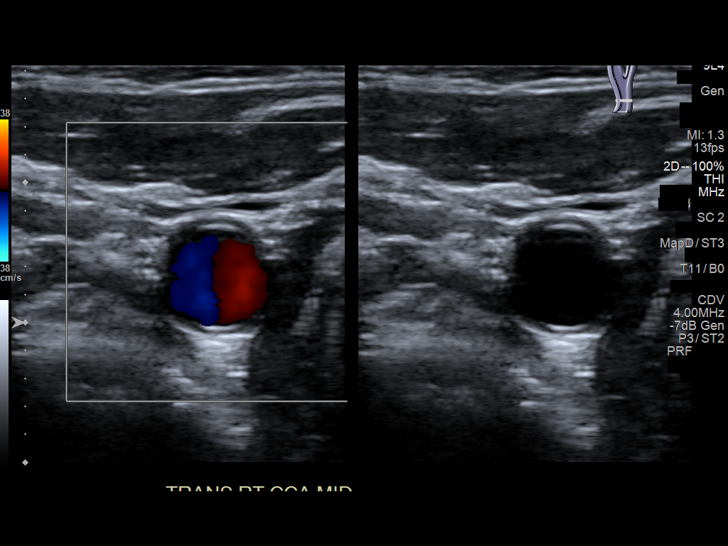
[im 12/65]
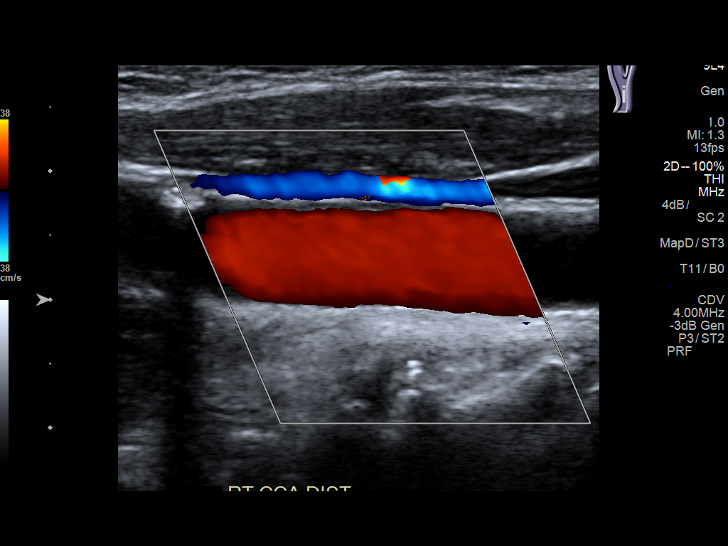
[im 17/65]
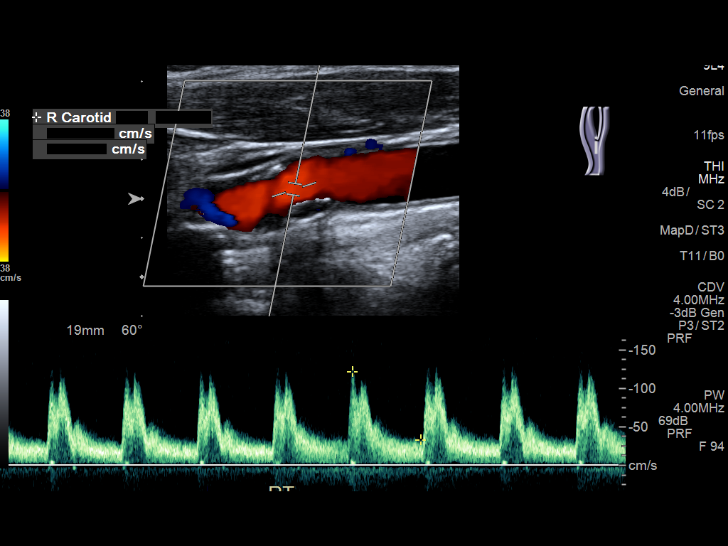
[im 23/65]
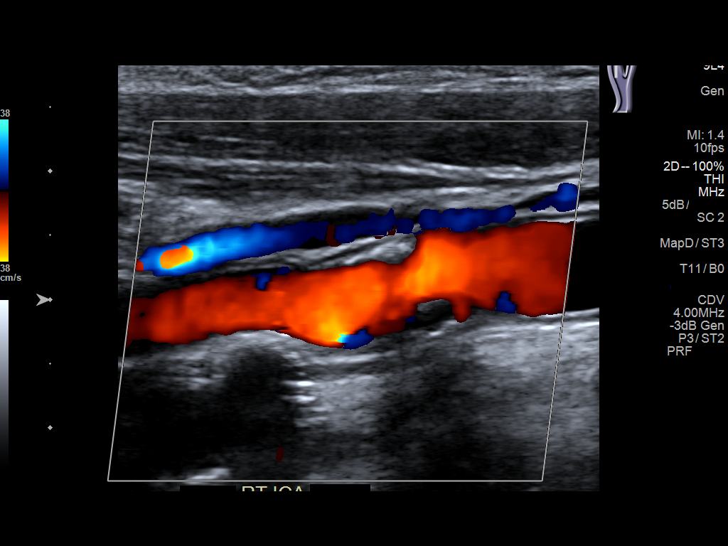
[im 28/65]
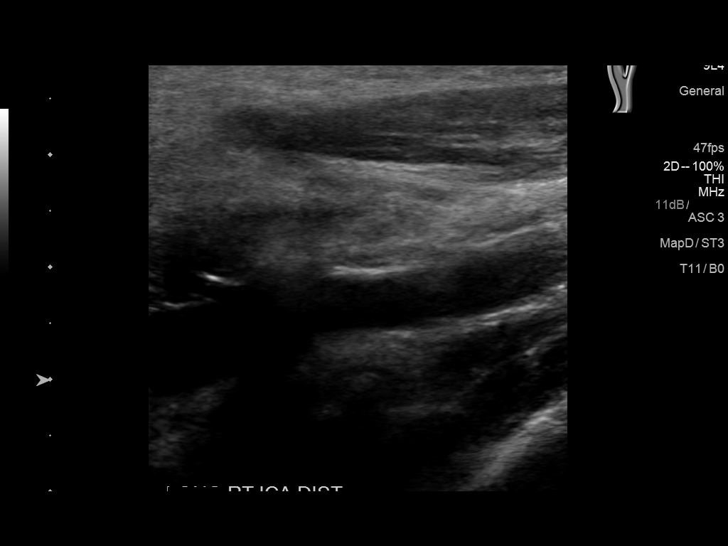
[im 34/65]
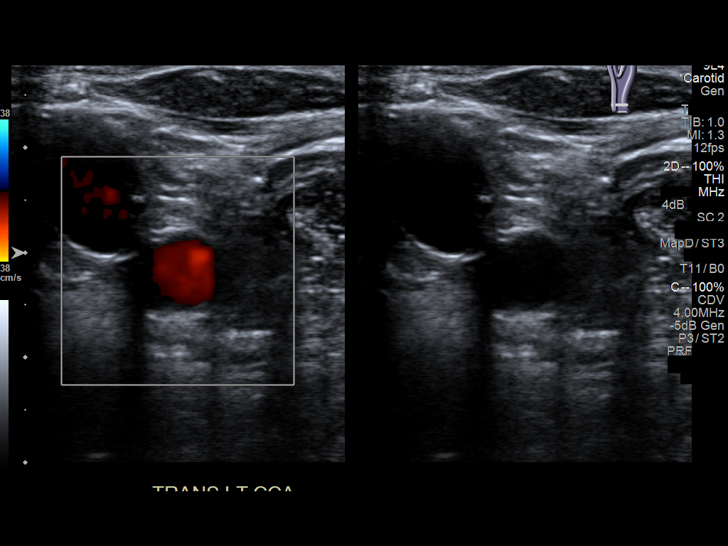
[im 37/65]
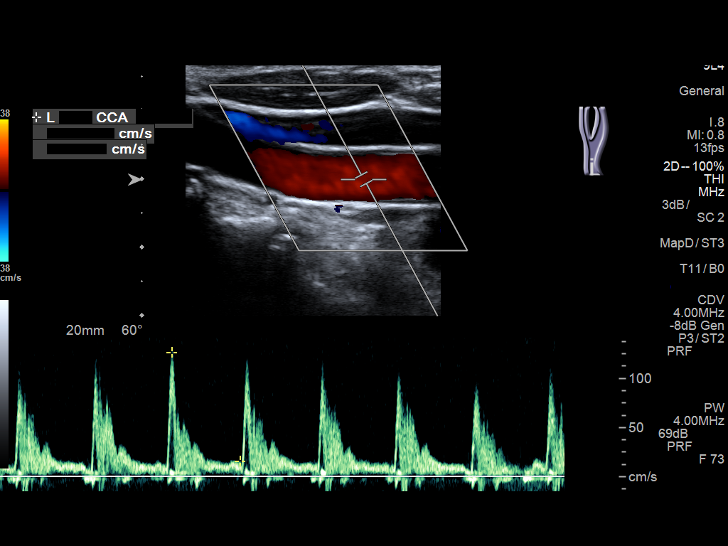
[im 42/65]
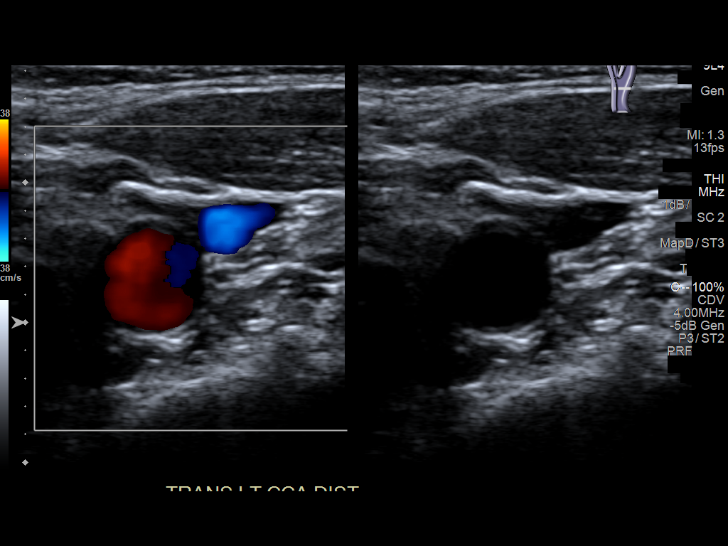
[im 48/65]
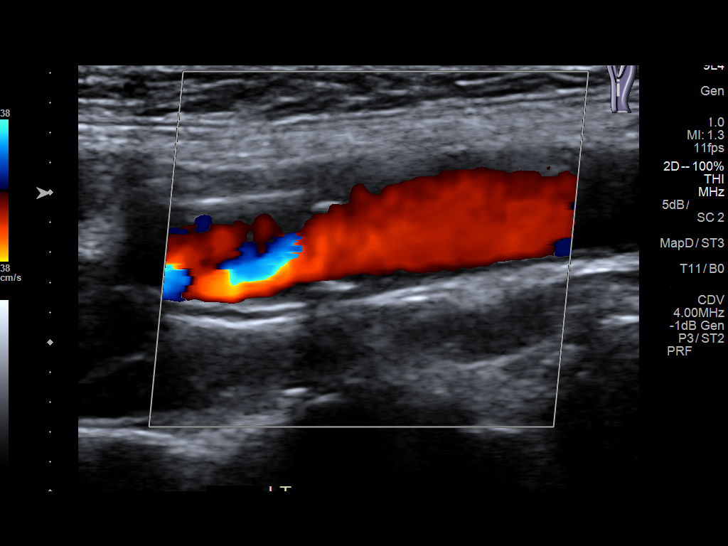
[im 53/65]
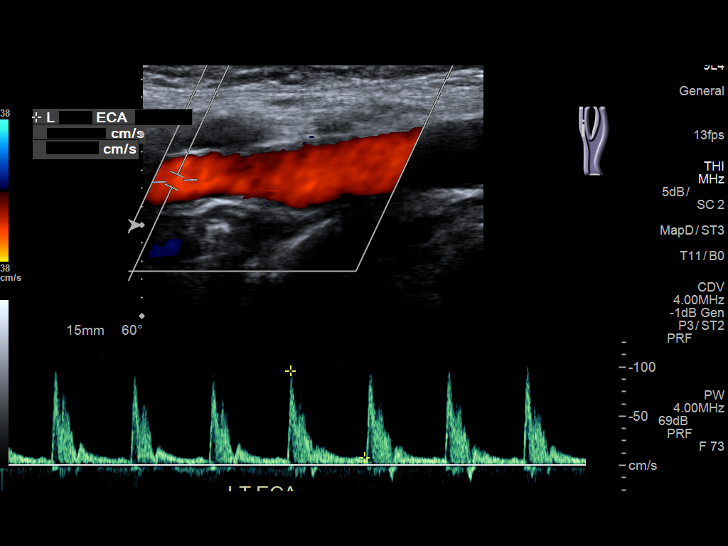
[im 59/65]
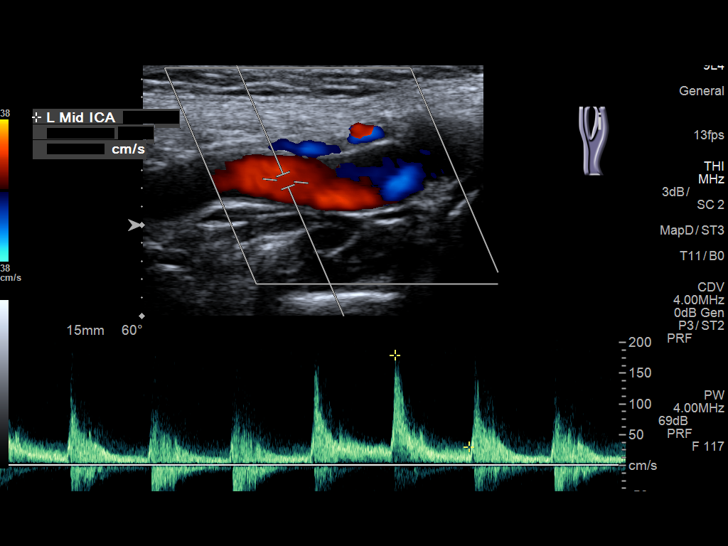
[im 65/65]
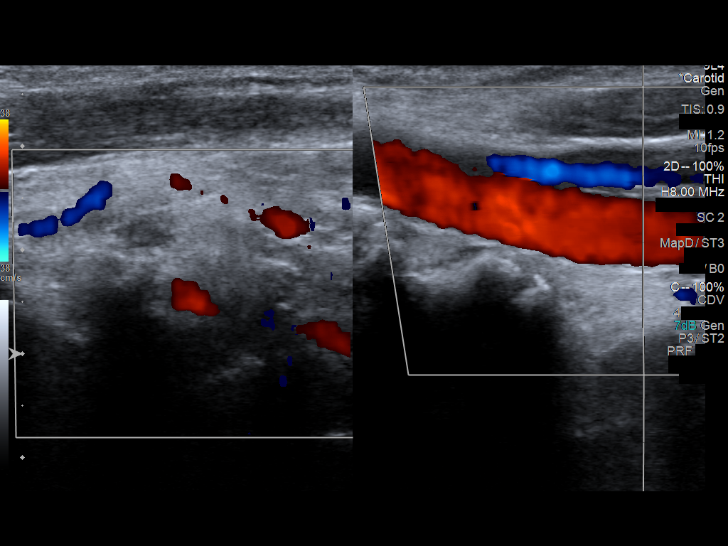

[13 of 24 positions shown; findings below may reference images not displayed]

The following velocity measurements were obtained:

PEAK SYSTOLIC/END DIASTOLIC

RIGHT

ICA:                     154/28cm/sec

CCA:                     102/17cm/sec

SYSTOLIC ICA/CCA RATIO:

DIASTOLIC ICA/CCA RATIO:

ECA:                     102cm/sec

LEFT

ICA:                     198/47cm/sec

CCA:                     119/19cm/sec

SYSTOLIC ICA/CCA RATIO:

DIASTOLIC ICA/CCA RATIO:

ECA:                     96cm/sec
FINDINGS: RIGHT CAROTID ARTERY: Eccentric partially calcified plaque in the
carotid bulb extending to proximal internal and external carotid
arteries resulting in at least mild stenosis. Normal waveforms and
color Doppler signal.

RIGHT VERTEBRAL ARTERY:  Normal flow direction and waveform.

LEFT CAROTID ARTERY: Mild partially calcified plaque in the carotid
bulb and proximal ICA. No high-grade stenosis. Normal waveforms and
color Doppler signal.

LEFT VERTEBRAL ARTERY: Normal flow direction and waveform.
IMPRESSION: 1. Bilateral carotid bifurcation and proximal ICA plaque resulting
in 50-69% diameter stenosis. The exam does not exclude plaque
ulceration or embolization. Continued surveillance recommended.

## 2016-10-21 ENCOUNTER — Other Ambulatory Visit: Payer: Self-pay | Admitting: Family Medicine

## 2016-10-21 DIAGNOSIS — F419 Anxiety disorder, unspecified: Principal | ICD-10-CM

## 2016-10-21 DIAGNOSIS — F329 Major depressive disorder, single episode, unspecified: Secondary | ICD-10-CM

## 2016-10-22 NOTE — Telephone Encounter (Signed)
Patient's daughter advised to schedule fu.

## 2016-10-22 NOTE — Telephone Encounter (Signed)
Remind patient, she is due for recheck appointment.

## 2016-10-22 NOTE — Telephone Encounter (Signed)
Last ov 07/22/16

## 2016-11-18 ENCOUNTER — Other Ambulatory Visit: Payer: Self-pay | Admitting: Family Medicine

## 2016-11-18 DIAGNOSIS — R413 Other amnesia: Secondary | ICD-10-CM

## 2016-11-26 ENCOUNTER — Ambulatory Visit (INDEPENDENT_AMBULATORY_CARE_PROVIDER_SITE_OTHER): Payer: Medicare Other | Admitting: Family Medicine

## 2016-11-26 ENCOUNTER — Encounter: Payer: Self-pay | Admitting: Family Medicine

## 2016-11-26 VITALS — BP 128/78 | HR 73 | Temp 98.3°F | Resp 14 | Wt 143.6 lb

## 2016-11-26 DIAGNOSIS — F418 Other specified anxiety disorders: Secondary | ICD-10-CM

## 2016-11-26 DIAGNOSIS — R413 Other amnesia: Secondary | ICD-10-CM | POA: Diagnosis not present

## 2016-11-26 DIAGNOSIS — Z23 Encounter for immunization: Secondary | ICD-10-CM

## 2016-11-26 DIAGNOSIS — E7800 Pure hypercholesterolemia, unspecified: Secondary | ICD-10-CM

## 2016-11-26 DIAGNOSIS — F419 Anxiety disorder, unspecified: Secondary | ICD-10-CM

## 2016-11-26 DIAGNOSIS — F329 Major depressive disorder, single episode, unspecified: Secondary | ICD-10-CM

## 2016-11-26 NOTE — Progress Notes (Signed)
Patient: Kathryn HampshireFrances Y Morales Female    DOB: [birth year]   70 y.o.   MRN: [id number] Visit Date: 11/26/2016  Today's Provider: Dortha Kernennis Maxime Beckner, PA   Chief Complaint  Patient presents with  . Follow-up   Subjective:    HPI Patient is here for a 4 month follow up. Patient was last seen on 07/22/2016. Patient was tolerating Aricept well with no side effects. Forms to attend [facility] per [facility] were completed. Patient is doing well on current medication regimen. Symptoms are unchanged since starting medication. She is attending adult day care 4 times per week and enjoys communicating with the other members.     Lipid/Cholesterol, Follow-up:   Last seen for this 4 months ago.  Management since that visit includes continue Simvastatin.  Last Lipid Panel:    Component Value Date/Time   CHOL 181 07/22/2016 0924   CHOL 178 04/12/2014 1040   TRIG 122 07/22/2016 0924   TRIG 102 04/12/2014 1040   HDL 56 07/22/2016 0924   HDL 53 04/12/2014 1040   CHOLHDL 3.2 07/22/2016 0924   VLDL 20 04/12/2014 1040   LDLCALC 101 (H) 07/22/2016 0924   LDLCALC 105 (H) 04/12/2014 1040    She reports excellent compliance with treatment. She is not having side effects.   Wt Readings from Last 3 Encounters:  11/26/16 143 lb 9.6 oz (65.1 kg)  07/22/16 137 lb (62.1 kg)  05/07/16 134 lb (60.8 kg)   Past Medical History:  Diagnosis Date  . Anxiety   . Hypertension    Patient Active Problem List   Diagnosis Date Noted  . Anxiety and depression 05/07/2016  . Carotid artery narrowing 05/07/2016  . Hypercholesteremia 02/14/2016  . History of colon polyps 06/09/2008  . BP (high blood pressure) 02/23/2008  . ADD (attention deficit disorder) 06/16/2007  . Clinical depression 03/31/2007  . HLD (hyperlipidemia) 03/31/2007   Past Surgical History:  Procedure Laterality Date  . BREAST SURGERY    . TONSILLECTOMY AND ADENOIDECTOMY    . TUBAL LIGATION     Family History  Problem Relation Age of  Onset  . Coronary artery disease Mother   . Heart disease Mother   . Lupus Sister   . Diabetes Brother   . Asthma Maternal Grandfather   . Breast cancer Paternal Grandmother   . Heart attack Paternal Grandfather   . Hypothyroidism Daughter   . Anxiety disorder Daughter   . Hypothyroidism Maternal Grandmother    No Known Allergies    Previous Medications   DONEPEZIL (ARICEPT) 5 MG TABLET    TAKE 1 TABLET BY MOUTH AT BEDTIME   PAROXETINE (PAXIL) 20 MG TABLET    TAKE 1 TABLET BY MOUTH DAILY   SIMVASTATIN (ZOCOR) 40 MG TABLET    Take 1 tablet (40 mg total) by mouth at bedtime.    Review of Systems  Constitutional: Negative.   Respiratory: Negative.   Cardiovascular: Negative.   Neurological:       Impaired memory     Social History  Substance Use Topics  . Smoking status: Never Smoker  . Smokeless tobacco: Not on file  . Alcohol use Yes   Objective:   BP 128/78 (BP Location: Right Arm, Patient Position: Sitting, Cuff Size: Normal)   Pulse 73   Temp 98.3 F (36.8 C) (Oral)   Resp 14   Wt 143 lb 9.6 oz (65.1 kg)   BMI 24.65 kg/m   Physical Exam  Constitutional: She is oriented to person,  place, and time. She appears well-developed and well-nourished. No distress.  HENT:  Head: Normocephalic and atraumatic.  Right Ear: Hearing normal.  Left Ear: Hearing normal.  Nose: Nose normal.  Eyes: Conjunctivae and lids are normal. Right eye exhibits no discharge. Left eye exhibits no discharge. No scleral icterus.  Neck: Neck supple.  Cardiovascular: Normal rate and regular rhythm.   Pulmonary/Chest: Effort normal and breath sounds normal. No respiratory distress.  Abdominal: Soft. Bowel sounds are normal.  Musculoskeletal: Normal range of motion.  Neurological: She is alert and oriented to person, place, and time.  Skin: Skin is intact. No lesion and no rash noted.  Psychiatric: She has a normal mood and affect. Her speech is normal and behavior is normal. Thought content  normal.      Assessment & Plan:     1. Impaired memory Patient repeats some questions and looks to family for answers frequently. Tolerating Aricept without side effects. Still participating in [facility] a few days a week and ejoying it. Does not drive anymore and has many meals with the family that lives close by her. No wandering or worsening of symptoms. Offered increase of Aricept to 10 mg or adding Namenda, but family declined as she is stable at this point. Will recheck routine labs and schedule follow up pending reports. - CBC with Differential/Platelet; Future - Comprehensive metabolic panel; Future - TSH; Future  2. Hypercholesteremia Tolerating Simvastatin 40 mg qd without complaint of myalgias or arthralgias. Recheck labs and continue diet. Encouraged to [facility] with [facility] the [facility] - Comprehensive metabolic panel; Future - Lipid panel; Future - TSH; Future  3. Anxiety and depression Stable and well controlled by the Paxil 20 mg qd. No suicidal ideation. Sleeping fairly well and no significant panic attacks recently. Recheck labs and follow up pending reports, - CBC with Differential/Platelet; Future - TSH; Future  4. Need for influenza vaccination - Flu vaccine HIGH DOSE PF

## 2017-01-10 ENCOUNTER — Ambulatory Visit (INDEPENDENT_AMBULATORY_CARE_PROVIDER_SITE_OTHER): Payer: Medicare Other | Admitting: Family Medicine

## 2017-01-10 ENCOUNTER — Encounter: Payer: Self-pay | Admitting: Family Medicine

## 2017-01-10 VITALS — BP 130/84 | HR 66 | Temp 98.4°F | Resp 14 | Wt 145.4 lb

## 2017-01-10 DIAGNOSIS — R413 Other amnesia: Secondary | ICD-10-CM | POA: Diagnosis not present

## 2017-01-10 DIAGNOSIS — F32A Depression, unspecified: Secondary | ICD-10-CM

## 2017-01-10 DIAGNOSIS — E7800 Pure hypercholesterolemia, unspecified: Secondary | ICD-10-CM | POA: Diagnosis not present

## 2017-01-10 DIAGNOSIS — F419 Anxiety disorder, unspecified: Secondary | ICD-10-CM

## 2017-01-10 DIAGNOSIS — F418 Other specified anxiety disorders: Secondary | ICD-10-CM

## 2017-01-10 DIAGNOSIS — F329 Major depressive disorder, single episode, unspecified: Secondary | ICD-10-CM

## 2017-01-10 MED ORDER — DONEPEZIL HCL 10 MG PO TABS: 10.0000 mg | ORAL_TABLET | Freq: Every day | ORAL | 6 refills | Status: AC

## 2017-01-10 NOTE — Progress Notes (Signed)
Patient: Kathryn HampshireFrances Y Decelle Female    DOB: 01-15-1946   71 y.o.   MRN: [id number] Visit Date: 01/10/2017  Today's Provider: Dortha Kernennis Chrismon, PA   Chief Complaint  Patient presents with  . Follow-up   Subjective:    HPI Patient is here to have a FL2 form completed [facility]. Family concerned about safety with her progressing dementia and living alone.  Patient Active Problem List   Diagnosis Date Noted  . Impaired memory 11/26/2016  . Anxiety and depression 05/07/2016  . Carotid artery narrowing 05/07/2016  . Hypercholesteremia 02/14/2016  . History of colon polyps 06/09/2008  . BP (high blood pressure) 02/23/2008  . ADD (attention deficit disorder) 06/16/2007  . Clinical depression 03/31/2007  . HLD (hyperlipidemia) 03/31/2007   Past Surgical History:  Procedure Laterality Date  . BREAST SURGERY    . TONSILLECTOMY AND ADENOIDECTOMY    . TUBAL LIGATION     Family History  Problem Relation Age of Onset  . Coronary artery disease Mother   . Heart disease Mother   . Lupus Sister   . Diabetes Brother   . Asthma Maternal Grandfather   . Breast cancer Paternal Grandmother   . Heart attack Paternal Grandfather   . Hypothyroidism Daughter   . Anxiety disorder Daughter   . Hypothyroidism Maternal Grandmother    No Known Allergies   Previous Medications   DONEPEZIL (ARICEPT) 5 MG TABLET    TAKE 1 TABLET BY MOUTH AT BEDTIME   PAROXETINE (PAXIL) 20 MG TABLET    TAKE 1 TABLET BY MOUTH DAILY   SIMVASTATIN (ZOCOR) 40 MG TABLET    Take 1 tablet (40 mg total) by mouth at bedtime.    Review of Systems  Constitutional: Negative.   Respiratory: Negative.   Cardiovascular: Negative.     Social History  Substance Use Topics  . Smoking status: Never Smoker  . Smokeless tobacco: Not on file  . Alcohol use Yes   Objective:   BP 130/84 (BP Location: Right Arm, Patient Position: Sitting, Cuff Size: Normal)   Pulse 66   Temp 98.4 F  (36.9 C) (Oral)   Resp 14   Wt 145 lb 6.4 oz (66 kg)   SpO2 97%   BMI 24.96 kg/m  Ht Readings from Last 3 Encounters:  08/01/15 5\' 4"  (1.626 m)    Physical Exam  Constitutional: She is oriented to person, place, and time. She appears well-developed and well-nourished. No distress.  HENT:  Head: Normocephalic and atraumatic.  Right Ear: Hearing normal.  Left Ear: Hearing normal.  Nose: Nose normal.  Mouth/Throat: Oropharynx is clear and moist.  Eyes: Conjunctivae and lids are normal. Right eye exhibits no discharge. Left eye exhibits no discharge. No scleral icterus.  Neck: Neck supple.  Cardiovascular: Normal rate and regular rhythm.   Pulmonary/Chest: Effort normal. No respiratory distress.  Abdominal: Soft. Bowel sounds are normal.  Musculoskeletal: Normal range of motion.  Neurological: She is alert and oriented to person, place, and time.  Skin: Skin is intact. No lesion and no rash noted.  Couple pink spots on right side of face and neck approximately 0.5 cm from "picking".  Psychiatric: Her speech is normal and behavior is normal. Thought content normal. Her mood appears anxious. Cognition and memory are impaired.      Assessment & Plan:     1. Impaired memory Disorientation/confusion and memory impairment progressing. Family concerned about her safety and living alone. Starting the process  of investigating an assisted living residence at [facility]. Will fill out FL-2 form and get routine labs. Increase Aricept to 10 mg qd and follow up pending lab reports. - CBC with Differential/Platelet - Comprehensive metabolic panel - TSH  2. Anxiety and depression Most of the time she seems happy until confusion occurs. Discussion of possible moving to a new residence caused a crying spell. Had difficulty grasping concepts and understanding the assisted living process. Still taking the Paroxetine 20 mg qd with fair control of moodiness. Recheck routine labs and follow up pending  reports. - CBC with Differential/Platelet - TSH  3. Hypercholesteremia Tolerating Simvastatin 40 mg qd and trying to follow low fat diet. Daughter helps administer medications on a regular basis. Recheck CMP, TSH and Lipid Panel. - Comprehensive metabolic panel - Lipid panel - TSH

## 2017-01-11 LAB — CBC WITH DIFFERENTIAL/PLATELET
BASOS: 0 %
Basophils Absolute: 0 10*3/uL (ref 0.0–0.2)
EOS (ABSOLUTE): 0.1 10*3/uL (ref 0.0–0.4)
Eos: 1 %
Hematocrit: 44.1 % (ref 34.0–46.6)
Hemoglobin: 14.7 g/dL (ref 11.1–15.9)
IMMATURE GRANULOCYTES: 0 %
Immature Grans (Abs): 0 10*3/uL (ref 0.0–0.1)
LYMPHS ABS: 1.7 10*3/uL (ref 0.7–3.1)
Lymphs: 24 %
MCH: 31.8 pg (ref 26.6–33.0)
MCHC: 33.3 g/dL (ref 31.5–35.7)
MCV: 96 fL (ref 79–97)
Monocytes Absolute: 0.5 10*3/uL (ref 0.1–0.9)
Monocytes: 7 %
NEUTROS PCT: 68 %
Neutrophils Absolute: 4.8 10*3/uL (ref 1.4–7.0)
PLATELETS: 220 10*3/uL (ref 150–379)
RBC: 4.62 x10E6/uL (ref 3.77–5.28)
RDW: 13.5 % (ref 12.3–15.4)
WBC: 7.2 10*3/uL (ref 3.4–10.8)

## 2017-01-11 LAB — COMPREHENSIVE METABOLIC PANEL
ALBUMIN: 5 g/dL — AB (ref 3.5–4.8)
ALK PHOS: 103 IU/L (ref 39–117)
ALT: 29 IU/L (ref 0–32)
AST: 25 IU/L (ref 0–40)
Albumin/Globulin Ratio: 2.6 — ABNORMAL HIGH (ref 1.2–2.2)
BUN / CREAT RATIO: 20 (ref 12–28)
BUN: 18 mg/dL (ref 8–27)
Bilirubin Total: 0.5 mg/dL (ref 0.0–1.2)
CO2: 29 mmol/L (ref 18–29)
Calcium: 9.8 mg/dL (ref 8.7–10.3)
Chloride: 99 mmol/L (ref 96–106)
Creatinine, Ser: 0.88 mg/dL (ref 0.57–1.00)
GFR, EST AFRICAN AMERICAN: 77 mL/min/{1.73_m2} (ref >59)
GFR, EST NON AFRICAN AMERICAN: 67 mL/min/{1.73_m2} (ref >59)
GLOBULIN, TOTAL: 1.9 g/dL (ref 1.5–4.5)
Glucose: 96 mg/dL (ref 65–99)
Potassium: 4.1 mmol/L (ref 3.5–5.2)
SODIUM: 142 mmol/L (ref 134–144)
TOTAL PROTEIN: 6.9 g/dL (ref 6.0–8.5)

## 2017-01-11 LAB — LIPID PANEL
CHOL/HDL RATIO: 3.5 ratio (ref 0.0–4.4)
CHOLESTEROL TOTAL: 190 mg/dL (ref 100–199)
HDL: 55 mg/dL (ref >39)
LDL CALC: 111 mg/dL — AB (ref 0–99)
Triglycerides: 120 mg/dL (ref 0–149)
VLDL CHOLESTEROL CAL: 24 mg/dL (ref 5–40)

## 2017-01-11 LAB — TSH: TSH: 1.71 u[IU]/mL (ref 0.450–4.500)

## 2017-01-14 ENCOUNTER — Telehealth: Payer: Self-pay | Admitting: Family Medicine

## 2017-01-14 NOTE — Telephone Encounter (Signed)
Please review. Thanks!  

## 2017-01-14 NOTE — Telephone Encounter (Signed)
Pt's daughter Kathryn Morales is calling wanting to know if mo's paperwork for entry Kathryn Morales is ready to be picked up.  Her call back is [phone]  Thank sTeri

## 2017-01-17 NOTE — Telephone Encounter (Signed)
Talked with Orpha BurKaty on 01-14-17 to let her know the paperwork is ready for Dr. Elisabeth CaraGilbert's signature and had hoped she could pick it up on 01-15-17 but snow prevented us from opening. Dr. Sullivan LoneGilbert is on the schedule for Saturday 01-18-17 if she needs it before Monday.

## 2017-01-17 NOTE — Progress Notes (Signed)
Have it ready for Dr. Elisabeth CaraGilbert's signature. [facility]

## 2017-09-11 ENCOUNTER — Telehealth: Payer: Self-pay | Admitting: Family Medicine

## 2017-09-11 NOTE — Telephone Encounter (Signed)
Patient's daughter Orpha BurKaty advised.

## 2017-09-11 NOTE — Telephone Encounter (Signed)
Pt's daughter called saying mom is Kathryn Morales and the nurse said she has been complaining about her hamstring muscle has been hurting.  Mom has dementia so she is not sure how accurate.  She wants to know what she needs to do.  Daughters call back is  [phone]  Thanks teri

## 2017-09-11 NOTE — Telephone Encounter (Signed)
Apply moist heat or Aspercreme with Lidocaine cream. If no better, should schedule appointment in 5-7 days.

## 2017-09-29 ENCOUNTER — Telehealth: Payer: Self-pay | Admitting: Family Medicine

## 2017-09-29 NOTE — Telephone Encounter (Signed)
Please review. Thanks!  

## 2017-09-29 NOTE — Telephone Encounter (Signed)
Pt's daughter called needing written order faxed to [facility] for pt to have ibuprofen given to her PRN for hip pain.  Daughters call back number [phone]  The fax number is [phone]  Thank sTeri

## 2017-09-29 NOTE — Telephone Encounter (Signed)
Have a prescription order written to fax for Naproxen 220 mg (Aleve) 1 tablet by mouth twice a day for 2 weeks #30. Need to schedule appointment in 10-14 days if pain persists.

## 2017-09-30 NOTE — Telephone Encounter (Signed)
Faxed to [facility] as below. Daughter advised on 09/29/17.

## 2017-10-17 ENCOUNTER — Ambulatory Visit
Admission: RE | Admit: 2017-10-17 | Discharge: 2017-10-17 | Disposition: A | Discharge status: Home / Self Care | Payer: Medicare Other | Source: Ambulatory Visit | Attending: Family Medicine | Admitting: Family Medicine

## 2017-10-17 ENCOUNTER — Ambulatory Visit (INDEPENDENT_AMBULATORY_CARE_PROVIDER_SITE_OTHER): Payer: Medicare Other | Admitting: Family Medicine

## 2017-10-17 ENCOUNTER — Encounter: Payer: Self-pay | Admitting: Family Medicine

## 2017-10-17 VITALS — BP 136/72 | HR 63 | Temp 97.7°F | Ht 64.0 in | Wt 145.8 lb

## 2017-10-17 DIAGNOSIS — M5125 Other intervertebral disc displacement, thoracolumbar region: Secondary | ICD-10-CM | POA: Diagnosis not present

## 2017-10-17 DIAGNOSIS — M5136 Other intervertebral disc degeneration, lumbar region: Secondary | ICD-10-CM | POA: Diagnosis not present

## 2017-10-17 DIAGNOSIS — M25551 Pain in right hip: Secondary | ICD-10-CM | POA: Diagnosis not present

## 2017-10-17 DIAGNOSIS — M5431 Sciatica, right side: Secondary | ICD-10-CM

## 2017-10-17 DIAGNOSIS — M5137 Other intervertebral disc degeneration, lumbosacral region: Secondary | ICD-10-CM | POA: Insufficient documentation

## 2017-10-17 DIAGNOSIS — R413 Other amnesia: Secondary | ICD-10-CM

## 2017-10-17 MED ORDER — PREDNISONE 10 MG (21) PO TBPK: ORAL_TABLET | ORAL | 0 refills | Status: DC

## 2017-10-17 NOTE — Progress Notes (Signed)
Patient: Kathryn HampshireFrances Y Morales Female    DOB: [birth year]   71 y.o.   MRN: [id number] Visit Date: 10/17/2017  Today's Provider: Dortha Kernennis Taeja Debellis, PA   Chief Complaint  Patient presents with  . Hip Pain   Subjective:    Hip Pain   Incident onset: 3 weeks. There was no injury mechanism. The pain is present in the right hip. The quality of the pain is described as shooting and aching. The pain has been worsening since onset. She has tried NSAIDs for the symptoms. The treatment provided no relief.   Past Medical History:  Diagnosis Date  . Anxiety   . Hypertension    Past Surgical History:  Procedure Laterality Date  . BREAST SURGERY    . TONSILLECTOMY AND ADENOIDECTOMY    . TUBAL LIGATION     Family History  Problem Relation Age of Onset  . Coronary artery disease Mother   . Heart disease Mother   . Lupus Sister   . Diabetes Brother   . Asthma Maternal Grandfather   . Breast cancer Paternal Grandmother   . Heart attack Paternal Grandfather   . Hypothyroidism Daughter   . Anxiety disorder Daughter   . Hypothyroidism Maternal Grandmother    No Known Allergies   Previous Medications   DONEPEZIL (ARICEPT) 10 MG TABLET    Take 1 tablet (10 mg total) by mouth at bedtime.   PAROXETINE (PAXIL) 20 MG TABLET    TAKE 1 TABLET BY MOUTH DAILY   SIMVASTATIN (ZOCOR) 40 MG TABLET    Take 1 tablet (40 mg total) by mouth at bedtime.    Review of Systems  Constitutional: Negative.   Respiratory: Negative.   Cardiovascular: Negative.   Musculoskeletal: Positive for arthralgias (hip pain ).    Social History  Substance Use Topics  . Smoking status: Never Smoker  . Smokeless tobacco: Not on file  . Alcohol use Yes   Objective:   BP 136/72 (BP Location: Right Arm, Patient Position: Sitting, Cuff Size: Normal)   Pulse 63   Temp 97.7 F (36.5 C) (Oral)   Ht 5\' 4"  (1.626 m)   Wt 145 lb 12.8 oz (66.1 kg)   SpO2 98%   BMI 25.03 kg/m   Physical Exam  Constitutional: She appears  well-developed and well-nourished. No distress.  HENT:  Head: Normocephalic and atraumatic.  Right Ear: Hearing normal.  Left Ear: Hearing normal.  Nose: Nose normal.  Eyes: Conjunctivae and lids are normal. Right eye exhibits no discharge. Left eye exhibits no discharge. No scleral icterus.  Neck: Neck supple.  Cardiovascular: Normal rate and regular rhythm.   Pulmonary/Chest: Effort normal and breath sounds normal. No respiratory distress.  Abdominal: Soft. Bowel sounds are normal.  Musculoskeletal: Normal range of motion. She exhibits tenderness.  Tender right hip along the sciatic distribution across the buttock to the posterior thigh. No weakness and SLR's 90 degrees with some extra pain. Slight spasm in lower back with extreme flexion.  Neurological: She is alert.  Confusion and anxiety.  Skin: Skin is intact. No lesion and no rash noted.  Psychiatric: Her speech is normal.      Assessment & Plan:     1. Right hip pain Has had right posterior hip and thigh pains/aches for the past several weeks. [facility] relief [facility]. No muscle weakness, falling or known injury. Will get x-ray evaluation of hip and back. Suspect sciatica. Treat with prednisone taper and follow  up pending reports of x-rays. - predniSONE (STERAPRED UNI-PAK 21 TAB) 10 MG (21) TBPK tablet; Taper dose pack as directed on package.  Dispense: 21 tablet; Refill: 0 - DG HIP UNILAT WITH PELVIS 2-3 VIEWS RIGHT  2. Sciatica of right side No sign of rash/shingles. Some soreness to palpate along the sciatic distribution. Check L-S spine x-rays to rule out degenerative disease or significant disc disease. Apply moist heat or ice pack for discomfort.  - DG Lumbar Spine Complete  3. Impaired memory Continues to take the Aricept and living at [facility]. Confused and anxious frequently. Continue present regimen and recheck prn.

## 2017-10-20 ENCOUNTER — Telehealth: Payer: Self-pay

## 2017-10-20 NOTE — Telephone Encounter (Signed)
Pt's daughter Orpha BurKaty advised.   Thanks,   -Vernona RiegerLaura

## 2017-10-20 NOTE — Telephone Encounter (Signed)
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 The prednisone should help to calm the inflammation. If no better after finishing this regimen, may need an MRI and/or referral to a spine specialist.

## 2017-12-29 ENCOUNTER — Telehealth: Payer: Self-pay | Admitting: Family Medicine

## 2017-12-29 NOTE — Telephone Encounter (Signed)
Please advise, sees Valetta Closeennis Chrismon-Anastasiya V Hopkins, RMA

## 2017-12-29 NOTE — Telephone Encounter (Signed)
ok 

## 2017-12-29 NOTE — Telephone Encounter (Signed)
Sandy advised-Kathryn Morales V Charmon Thorson, RMA  

## 2017-12-29 NOTE — Telephone Encounter (Signed)
Needs verbal ok to start home health today 12/29/17.

## 2017-12-29 NOTE — Telephone Encounter (Signed)
lmtcb-Mikal Blasdell V Lashawnna Lambrecht, RMA  

## 2017-12-29 NOTE — Telephone Encounter (Signed)
Proceed with home assessment and treatment.

## 2018-02-27 ENCOUNTER — Telehealth: Payer: Self-pay

## 2018-02-27 NOTE — Telephone Encounter (Signed)
Called to schedule AWV and daughter states she needs to check with husband (pts transportation) and see when she will be available to come in. [facility]. -MM

## 2018-03-24 NOTE — Telephone Encounter (Signed)
Scheduled 05/29/18 @ 9:20 AM.  -MM

## 2018-05-29 ENCOUNTER — Ambulatory Visit: Payer: Self-pay

## 2018-05-29 ENCOUNTER — Encounter: Payer: Self-pay | Admitting: Family Medicine

## 2018-05-29 NOTE — Progress Notes (Deleted)
Patient: Kathryn Morales, Female    DOB: Mar 20, 1946, 71 y.o.   MRN: [id number] Visit Date: 05/29/2018  Today's Provider: Dortha Kern, PA   No chief complaint on file.  Subjective:     Complete Physical Kathryn Morales is a 72 y.o. female. She feels {DESC; WELL/FAIRLY WELL/POORLY:18703}. She reports exercising ***. She reports she is sleeping {DESC; WELL/FAIRLY WELL/POORLY:18703}.  -----------------------------------------------------------   Review of Systems  Constitutional: Negative.   HENT: Negative.   Eyes: Negative.   Respiratory: Negative.   Cardiovascular: Negative.   Gastrointestinal: Negative.   Endocrine: Negative.   Genitourinary: Negative.   Musculoskeletal: Negative.   Skin: Negative.   Allergic/Immunologic: Negative.   Neurological: Negative.   Hematological: Negative.   Psychiatric/Behavioral: Negative.     Social History   Socioeconomic History  . Marital status: Widowed    Spouse name: Not on file  . Number of children: Not on file  . Years of education: Not on file  . Highest education level: Not on file  Occupational History  . Not on file  Social Needs  . Financial resource strain: Not on file  . Food insecurity:    Worry: Not on file    Inability: Not on file  . Transportation needs:    Medical: Not on file    Non-medical: Not on file  Tobacco Use  . Smoking status: Never Smoker  . Smokeless tobacco: Never Used  Substance and Sexual Activity  . Alcohol use: Yes  . Drug use: No  . Sexual activity: Never  Lifestyle  . Physical activity:    Days per week: Not on file    Minutes per session: Not on file  . Stress: Not on file  Relationships  . Social connections:    Talks on phone: Not on file    Gets together: Not on file    Attends religious service: Not on file    Active member of club or organization: Not on file    Attends meetings of clubs or organizations: Not on file    Relationship status: Not on file  .  Intimate partner violence:    Fear of current or ex partner: Not on file    Emotionally abused: Not on file    Physically abused: Not on file    Forced sexual activity: Not on file  Other Topics Concern  . Not on file  Social History Narrative  . Not on file    Past Medical History:  Diagnosis Date  . Anxiety   . Hypertension      Patient Active Problem List   Diagnosis Date Noted  . Impaired memory 11/26/2016  . Anxiety and depression 05/07/2016  . Carotid artery narrowing 05/07/2016  . Hypercholesteremia 02/14/2016  . History of colon polyps 06/09/2008  . BP (high blood pressure) 02/23/2008  . ADD (attention deficit disorder) 06/16/2007  . Clinical depression 03/31/2007  . HLD (hyperlipidemia) 03/31/2007    Past Surgical History:  Procedure Laterality Date  . BREAST SURGERY    . TONSILLECTOMY AND ADENOIDECTOMY    . TUBAL LIGATION      Her family history includes Anxiety disorder in her daughter; Asthma in her maternal grandfather; Breast cancer in her paternal grandmother; Coronary artery disease in her mother; Diabetes in her brother; Heart attack in her paternal grandfather; Heart disease in her mother; Hypothyroidism in her daughter and maternal grandmother; Lupus in her sister.      Current Outpatient Medications:  .  donepezil (ARICEPT) 10 MG tablet, Take 1 tablet (10 mg total) by mouth at bedtime., Disp: 30 tablet, Rfl: 6 .  PARoxetine (PAXIL) 20 MG tablet, TAKE 1 TABLET BY MOUTH DAILY, Disp: 30 tablet, Rfl: 3 .  predniSONE (STERAPRED UNI-PAK 21 TAB) 10 MG (21) TBPK tablet, Taper dose pack as directed on package., Disp: 21 tablet, Rfl: 0 .  simvastatin (ZOCOR) 40 MG tablet, Take 1 tablet (40 mg total) by mouth at bedtime., Disp: 90 tablet, Rfl: 3  Patient Care Team: Chrismon, Jodell Ciproennis E, PA as PCP - General (Physician Assistant)     Objective:   Vitals: There were no vitals taken for this visit.  Physical Exam  Activities of Daily Living In your present  state of health, do you have any difficulty performing the following activities: 10/17/2017  Hearing? N  Vision? N  Difficulty concentrating or making decisions? Y  Walking or climbing stairs? N  Dressing or bathing? N  Doing errands, shopping? N  Some recent data might be hidden    Fall Risk Assessment Fall Risk  10/17/2017  Falls in the past year? No     Depression Screen PHQ 2/9 Scores 10/17/2017  PHQ - 2 Score 2  PHQ- 9 Score 8    Assessment & Plan:    Annual Physical Reviewed patient's Family Medical History Reviewed and updated list of patient's medical providers Assessment of cognitive impairment was done Assessed patient's functional ability Established a written schedule for health screening services Health Risk Assessent Completed and Reviewed  Exercise Activities and Dietary recommendations Goals    None      Immunization History  Administered Date(s) Administered  . Influenza Split 12/04/2011  . Influenza, High Dose Seasonal PF 11/26/2016  . Influenza-Unspecified 10/10/2017  . Pneumococcal Conjugate-13 04/08/2014  . Pneumococcal Polysaccharide-23 05/26/2012  . Tdap 07/18/2009  . Zoster 09/04/2012    Health Maintenance  Topic Date Due  . Hepatitis C Screening  1946/04/04  . COLONOSCOPY  10/17/2018 (Originally 12/07/2014)  . INFLUENZA VACCINE  07/30/2018  . TETANUS/TDAP  07/19/2019  . MAMMOGRAM  10/18/2019  . DEXA SCAN  Completed  . PNA vac Low Risk Adult  Completed     Discussed health benefits of physical activity, and encouraged her to engage in regular exercise appropriate for her age and condition.    ------------------------------------------------------------------------------------------------------------    Dortha Kernennis Chrismon, PA  [facility]

## 2018-06-01 ENCOUNTER — Telehealth: Payer: Self-pay | Admitting: Family Medicine

## 2018-06-01 NOTE — Telephone Encounter (Signed)
Last OV notes faxed to Alyssa with [facility] added FYI regarding missed appointment to cover sheet.

## 2018-06-01 NOTE — Telephone Encounter (Signed)
Alyssa with [facility] needs a copy of pt's most recent visit here.  She needs this for her medicare face to face  She would like it faxed to [phone]  Alyssa's call back is [phone]  Thanks teri

## 2018-06-01 NOTE — Telephone Encounter (Signed)
OK. Remind them patient did not show up for her 05-29-18 appointment.

## 2018-06-10 ENCOUNTER — Telehealth: Payer: Self-pay

## 2018-06-10 NOTE — Telephone Encounter (Signed)
LM on daughters cell # (ok per DPR) to CB and schedule AWV and CPE. -MM

## 2018-06-22 ENCOUNTER — Ambulatory Visit: Payer: Self-pay | Admitting: Family Medicine

## 2018-06-26 ENCOUNTER — Ambulatory Visit: Payer: Medicare Other | Admitting: Family Medicine

## 2018-06-26 ENCOUNTER — Encounter: Payer: Self-pay | Admitting: Family Medicine

## 2018-06-26 VITALS — BP 112/64 | HR 66 | Temp 98.1°F | Resp 16 | Ht 64.0 in | Wt 141.0 lb

## 2018-06-26 DIAGNOSIS — R413 Other amnesia: Secondary | ICD-10-CM | POA: Diagnosis not present

## 2018-06-26 DIAGNOSIS — M1611 Unilateral primary osteoarthritis, right hip: Secondary | ICD-10-CM

## 2018-06-26 DIAGNOSIS — M5136 Other intervertebral disc degeneration, lumbar region: Secondary | ICD-10-CM | POA: Diagnosis not present

## 2018-06-26 DIAGNOSIS — F419 Anxiety disorder, unspecified: Secondary | ICD-10-CM

## 2018-06-26 DIAGNOSIS — F329 Major depressive disorder, single episode, unspecified: Secondary | ICD-10-CM

## 2018-06-26 DIAGNOSIS — E7800 Pure hypercholesterolemia, unspecified: Secondary | ICD-10-CM | POA: Diagnosis not present

## 2018-06-26 MED ORDER — ARIPIPRAZOLE 2 MG PO TABS: 2.0000 mg | ORAL_TABLET | Freq: Every day | ORAL | 6 refills | Status: AC

## 2018-06-26 NOTE — Progress Notes (Signed)
Patient: Kathryn Morales Female    DOB: 27-Jul-1946   72 y.o.   MRN: [id number] Visit Date: 06/26/2018  Today's Provider: Dortha Kern, PA   Follow up dementia.  Subjective:    HPI  Patient is here for face to face for Home Health services. Having episodes of anxiety and irritability with impaired memory worsening. Seems to occur more frequently in the evenings. Family states she does not sleep in her bed - only on the couch. Continues to live at [facility] and has [facility] visits for assessment and treatment. Family reports an incident of her being a little aggressive at times. Eating well and normal bowel/kidney function.  Past Medical History:  Diagnosis Date  . Anxiety   . Hypertension    Patient Active Problem List   Diagnosis Date Noted  . Impaired memory 11/26/2016  . Anxiety and depression 05/07/2016  . Carotid artery narrowing 05/07/2016  . Hypercholesteremia 02/14/2016  . History of colon polyps 06/09/2008  . BP (high blood pressure) 02/23/2008  . ADD (attention deficit disorder) 06/16/2007  . Clinical depression 03/31/2007  . HLD (hyperlipidemia) 03/31/2007   Past Surgical History:  Procedure Laterality Date  . BREAST SURGERY    . TONSILLECTOMY AND ADENOIDECTOMY    . TUBAL LIGATION     Family History  Problem Relation Age of Onset  . Coronary artery disease Mother   . Heart disease Mother   . Lupus Sister   . Diabetes Brother   . Asthma Maternal Grandfather   . Breast cancer Paternal Grandmother   . Heart attack Paternal Grandfather   . Hypothyroidism Daughter   . Anxiety disorder Daughter   . Hypothyroidism Maternal Grandmother    No Known Allergies  Current Outpatient Medications:  .  donepezil (ARICEPT) 10 MG tablet, Take 1 tablet (10 mg total) by mouth at bedtime., Disp: 30 tablet, Rfl: 6 .  PARoxetine (PAXIL) 20 MG tablet, TAKE 1 TABLET BY MOUTH DAILY, Disp: 30 tablet, Rfl: 3 .  simvastatin (ZOCOR) 40 MG tablet, Take 1  tablet (40 mg total) by mouth at bedtime., Disp: 90 tablet, Rfl: 3 .  predniSONE (STERAPRED UNI-PAK 21 TAB) 10 MG (21) TBPK tablet, Taper dose pack as directed on package. (Patient not taking: Reported on 06/26/2018), Disp: 21 tablet, Rfl: 0  Review of Systems  Constitutional: Negative for appetite change, chills, fatigue and fever.  Respiratory: Negative for chest tightness and shortness of breath.   Cardiovascular: Negative for chest pain and palpitations.  Gastrointestinal: Negative for abdominal pain, nausea and vomiting.  Neurological: Negative for dizziness and weakness.    Social History   Tobacco Use  . Smoking status: Never Smoker  . Smokeless tobacco: Never Used  Substance Use Topics  . Alcohol use: Yes   Objective:   BP 112/64 (BP Location: Right Arm, Patient Position: Sitting, Cuff Size: Normal)   Pulse 66   Temp 98.1 F (36.7 C) (Oral)   Resp 16   Ht 5\' 4"  (1.626 m)   Wt 141 lb (64 kg)   SpO2 99%   BMI 24.20 kg/m  Vitals:   06/26/18 0902  BP: 112/64  Pulse: 66  Resp: 16  Temp: 98.1 F (36.7 C)  TempSrc: Oral  SpO2: 99%  Weight: 141 lb (64 kg)  Height: 5\' 4"  (1.626 m)     Physical Exam  Constitutional: She appears well-developed and well-nourished.  HENT:  Head: Normocephalic and atraumatic.  Right Ear: External ear normal.  Left Ear: External ear normal.  Nose: Nose normal.  Mouth/Throat: Oropharynx is clear and moist.  Eyes: Pupils are equal, round, and reactive to light. Conjunctivae and EOM are normal. Right eye exhibits no discharge.  Neck: Normal range of motion. Neck supple. No tracheal deviation present. No thyromegaly present.  Cardiovascular: Normal rate, regular rhythm, normal heart sounds and intact distal pulses.  No murmur heard. Pulmonary/Chest: Effort normal and breath sounds normal. No respiratory distress. She has no wheezes. She has no rales. She exhibits no tenderness.  Abdominal: Soft. She exhibits no distension and no mass.  There is no tenderness. There is no rebound and no guarding.  Musculoskeletal: Normal range of motion. She exhibits no edema or tenderness.  Lymphadenopathy:    She has no cervical adenopathy.  Neurological: She is alert. She has normal reflexes. She displays normal reflexes. No cranial nerve deficit. She exhibits normal muscle tone. Coordination normal.  Seem happy today. Frequently asking family for answers to interview questions.   Skin: Skin is warm and dry. No rash noted. No erythema.  Psychiatric: Her speech is normal and behavior is normal. Judgment and thought content normal. Her mood appears anxious. She exhibits a depressed mood.      Assessment & Plan:     1. Impaired memory Memory worsening. Frequently asking family to help with interview questions. Confusion causing some disorientation and anxiety. Still taking the Aricept 10 mg hs. Will proceed with [facility] assessment/evaluation and still living in [facility] in memory care. Recheck routine labs for metabolic imbalances or anemia. [facility]. Recommend she proceed with scheduling medicare AWE (may be difficult to accomplish with memory issues). - CBC with Differential/Platelet - Comprehensive metabolic panel  2. Anxiety and depression Anxious and has had an episode of irritability causing conflict/aggressive behavior despite use of Paxil. Symptoms with some crying spells occurs most frequently at dusk/night (? Sundown Syndrome?). Will stop the Paxil and give trial of Abilify 2 mg at bedtime. Recheck labs and follow up pending reports. - ARIPiprazole (ABILIFY) 2 MG tablet; Take 1 tablet (2 mg total) by mouth at bedtime.  Dispense: 30 tablet; Refill: 6 - CBC with Differential/Platelet - Comprehensive metabolic panel - TSH  3. Pure hypercholesterolemia Tolerating Simvastatin 40 mg qd without side effects. Encouraged to attend some [facility]. Recheck labs and should schedule medicare AWE. - Comprehensive metabolic panel - Lipid panel - TSH  4. Arthritis of right hip Some right hip aches and stiffness with x-ray on 10-17-17 showing mild degenerative changes in the right hip joint. Walking well today. May use Tylenol, Advil or heating pad/ice packs prn. Check CBC to rule out infection. [facility] - CBC with Differential/Platelet  5. Degenerative disc disease, lumbar X-rays on 10-17-17 showed advanced diffuse degenerative facet disease throughout the lumbar Spine, grade 1 anterolisthesis T12 on L1, degenerative disc disease at L5-S1, and no acute findings. May use Tylenol or Advil prn aches and pains. Check routine labs. - CBC with Differential/Platelet       Dortha Kernennis Dianna Ewald, PA  [facility]

## 2018-06-29 ENCOUNTER — Ambulatory Visit: Payer: Self-pay | Admitting: Family Medicine

## 2018-06-30 NOTE — Telephone Encounter (Signed)
Scheduled for 08/07/18. -MM

## 2018-07-20 ENCOUNTER — Telehealth: Payer: Self-pay

## 2018-07-20 NOTE — Telephone Encounter (Signed)
[name] patient:: [facility] states patient is directed to take all medications at bedtime, but patient is refusing to take medications at bedtime. Kathryn Morales wants to know if a new order can be faxed for patient to take meds in the morning instead. Please advise.  CB# [phone]. Fax new order to [phone] Attn: Kathryn Morales

## 2018-07-20 NOTE — Telephone Encounter (Signed)
Order written for Dr. Sullivan LoneGilbert to sign.

## 2018-07-20 NOTE — Telephone Encounter (Signed)
Please advise 

## 2018-07-20 NOTE — Telephone Encounter (Signed)
Ok to do

## 2018-07-23 NOTE — Telephone Encounter (Signed)
Per Boykin Reaperachelle, this was faxed.

## 2018-08-07 ENCOUNTER — Ambulatory Visit (INDEPENDENT_AMBULATORY_CARE_PROVIDER_SITE_OTHER): Payer: Medicare Other

## 2018-08-07 VITALS — BP 104/62 | HR 65 | Temp 98.3°F | Ht 64.0 in | Wt 142.6 lb

## 2018-08-07 DIAGNOSIS — Z0000 Encounter for general adult medical examination without abnormal findings: Secondary | ICD-10-CM | POA: Diagnosis not present

## 2018-08-07 NOTE — Progress Notes (Addendum)
Subjective:   Kathryn HampshireFrances Y Morales is a 72 y.o. female who presents for an Initial Medicare Annual Wellness Visit.  Review of Systems    N/A  Cardiac Risk Factors include: advanced age (>7455men, 6>65 women);dyslipidemia;hypertension     Objective:    Today's Vitals   08/07/18 0919 08/07/18 0925  BP: 104/62   Pulse: 65   Temp: 98.3 F (36.8 C)   TempSrc: Oral   Weight: 142 lb 9.6 oz (64.7 kg)   Height: 5\' 4"  (1.626 m)   PainSc: 0-No pain 0-No pain   Body mass index is 24.48 kg/m.  Advanced Directives 08/07/2018 07/22/2016 08/01/2015  Does Patient Have a Medical Advance Directive? Yes Yes No  Type of Estate agentAdvance Directive Healthcare Power of [place]orney;Living will Healthcare Power of [place];Living will -  Copy of Healthcare Power of Attorney in Chart? Yes No - copy requested -  Would patient like information on creating a medical advance directive? - - No - patient declined information    Current Medications (verified) Outpatient Encounter Medications as of 08/07/2018  Medication Sig  . ARIPiprazole (ABILIFY) 2 MG tablet Take 1 tablet (2 mg total) by mouth at bedtime.  . donepezil (ARICEPT) 10 MG tablet Take 1 tablet (10 mg total) by mouth at bedtime.  . simvastatin (ZOCOR) 40 MG tablet Take 1 tablet (40 mg total) by mouth at bedtime.  . predniSONE (STERAPRED UNI-PAK 21 TAB) 10 MG (21) TBPK tablet Taper dose pack as directed on package. (Patient not taking: Reported on 06/26/2018)   No facility-administered encounter medications on file as of 08/07/2018.     Allergies (verified) Patient has no known allergies.   History: Past Medical History:  Diagnosis Date  . Anxiety   . Hyperlipidemia   . Hypertension    Past Surgical History:  Procedure Laterality Date  . BREAST SURGERY    . TONSILLECTOMY AND ADENOIDECTOMY    . TUBAL LIGATION     Family History  Problem Relation Age of Onset  . Coronary artery disease Mother   . Heart disease Mother   . Lupus Sister   . Diabetes  Brother   . Asthma Maternal Grandfather   . Breast cancer Paternal Grandmother   . Heart attack Paternal Grandfather   . Hypothyroidism Daughter   . Anxiety disorder Daughter   . Hypothyroidism Maternal Grandmother    Social History   Socioeconomic History  . Marital status: Widowed    Spouse name: Not on file  . Number of children: 2  . Years of education: Not on file  . Highest education level: Master's degree (e.g., MA, MS, MEng, MEd, MSW, MBA)  Occupational History  . Occupation: retired  Engineer, productionocial Needs  . Financial resource strain: Not hard at all  . Food insecurity:    Worry: Never true    Inability: Never true  . Transportation needs:    Medical: No    Non-medical: No  Tobacco Use  . Smoking status: Never Smoker  . Smokeless tobacco: Never Used  Substance and Sexual Activity  . Alcohol use: Yes    Comment: social- minimal  . Drug use: No  . Sexual activity: Never  Lifestyle  . Physical activity:    Days per week: Not on file    Minutes per session: Not on file  . Stress: Not at all  Relationships  . Social connections:    Talks on phone: Not on file    Gets together: Not on file    Attends religious  service: Not on file    Active member of club or organization: Not on file    Attends meetings of clubs or organizations: Not on file    Relationship status: Not on file  Other Topics Concern  . Not on file  Social History Narrative  . Not on file    Tobacco Counseling Counseling given: Not Answered   Clinical Intake:  Pre-visit preparation completed: Yes  Pain : No/denies pain Pain Score: 0-No pain     Nutritional Status: BMI of 19-24  Normal Diabetes: No  How often do you need to have someone help you when you read instructions, pamphlets, or other written materials from your doctor or pharmacy?: 1 - Never  Interpreter Needed?: No  Information entered by :: [facility], LPN   Activities of Daily Living In your present state of health, do  you have any difficulty performing the following activities: 08/07/2018 10/17/2017  Hearing? N N  Vision? N N  Difficulty concentrating or making decisions? Y Y  Comment On [place] for this.  -  Walking or climbing stairs? N N  Dressing or bathing? N N  Doing errands, shopping? Y N  Comment Does not drive.  -  Preparing Food and eating ? Y -  Comment Lives at assisted living facility. -  Using the Toilet? N -  In the past six months, have you accidently leaked urine? N -  Do you have problems with loss of bowel control? N -  Managing your Medications? Y -  Comment Lives at assisted living facility. -  Managing your Finances? Y -  Comment Daughter manages medication.  -  Housekeeping or managing your Housekeeping? Y -  Comment Lives at assisted living facility. -  Some recent data might be hidden     Immunizations and Health Maintenance Immunization History  Administered Date(s) Administered  . Influenza Split 12/04/2011  . Influenza, High Dose Seasonal PF 11/26/2016  . Influenza-Unspecified 10/10/2017  . Pneumococcal Conjugate-13 04/08/2014  . Pneumococcal Polysaccharide-23 05/26/2012  . Tdap 07/18/2009  . Zoster 09/04/2012   Health Maintenance Due  Topic Date Due  . Hepatitis C Screening  28-Apr-1946  . INFLUENZA VACCINE  07/30/2018    Patient Care Team: Chrismon, Jodell Cipro, PA as PCP - General (Physician Assistant)  Indicate any recent Medical Services you may have received from other than [facility] providers in the past year (date may be approximate).     Assessment:   This is a routine wellness examination for Kathryn Morales.  Hearing/Vision screen No exam data present  Dietary issues and exercise activities discussed: Current Exercise Habits: Home exercise routine(Assisted living facility does a cicuit stretching routine.), Type of exercise: stretching, Intensity: Mild, Exercise limited by: Other - see comments(Dementia)  Goals   None    Depression Screen PHQ 2/9  Scores 08/07/2018 10/17/2017  PHQ - 2 Score 0 2  PHQ- 9 Score - 8    Fall Risk Fall Risk  08/07/2018 10/17/2017  Falls in the past year? No No    Is the patient's home free of loose throw rugs in walkways, pet beds, electrical cords, etc?   yes      Grab bars in the bathroom? yes      Handrails on the stairs?   yes      Adequate lighting?   yes  Timed Get Up and Go Performed N/A  Cognitive Function:        Screening Tests Health Maintenance  Topic Date Due  . Hepatitis  C Screening  02-Apr-1946  . INFLUENZA VACCINE  07/30/2018  . COLONOSCOPY  10/17/2018 (Originally 12/07/2014)  . TETANUS/TDAP  07/19/2019  . MAMMOGRAM  10/18/2019  . DEXA SCAN  Completed  . PNA vac Low Risk Adult  Completed    Qualifies for Shingles Vaccine? Due for Shingles vaccine. Declined my offer to administer today. Education has been provided regarding the importance of this vaccine. Pt has been advised to call her insurance company to determine her out of pocket expense. Advised she may also receive this vaccine at her local pharmacy or Health Dept. Verbalized acceptance and understanding.  Cancer Screenings: Lung: Low Dose CT Chest recommended if Age 21-80 years, 30 pack-year currently smoking OR have quit w/in 15years. Patient does not qualify. Breast: Up to date on Mammogram? Yes   Up to date of Bone Density/Dexa? Yes Colorectal: Pt declines referral today.   Additional Screenings:  Hepatitis C Screening: Pt declines today.    Plan:  I have personally reviewed and addressed the Medicare Annual Wellness questionnaire and have noted the following in the patient's chart:  A. Medical and social history B. Use of alcohol, tobacco or illicit drugs  C. Current medications and supplements D. Functional ability and status E.  Nutritional status F.  Physical activity G. Advance directives H. List of other physicians I.  Hospitalizations, surgeries, and ER visits in previous 12 months J.   Vitals K. Screenings such as hearing and vision if needed, cognitive and depression L. Referrals and appointments - none  In addition, I have reviewed and discussed with patient certain preventive protocols, quality metrics, and best practice recommendations. A written personalized care plan for preventive services as well as general preventive health recommendations were provided to patient.  See attached scanned questionnaire for additional information.   Signed,  Hyacinth Meeker, LPN Nurse Health Advisor   Nurse Recommendations:Pt declined a colonoscopy referral and Hep C lab order today.   Reviewed documentation and recommendations of Nurse Health Advisor screening. Agree with note and will review with patient and family at her next appointment.

## 2018-08-07 NOTE — Patient Instructions (Signed)
Kathryn Morales , Thank you for taking time to come for your Medicare Wellness Visit. I appreciate your ongoing commitment to your health goals. Please review the following plan we discussed and let me know if I can assist you in the future.   Screening recommendations/referrals: Colonoscopy: Pt declines referral today.  Mammogram: Up to date Bone Density: Up to date Recommended yearly ophthalmology/optometry visit for glaucoma screening and checkup Recommended yearly dental visit for hygiene and checkup  Vaccinations: Influenza vaccine: Up to date Pneumococcal vaccine: Up to date Tdap vaccine: Up to date Shingles vaccine: Pt declines today.     Advanced directives: Already on file.   Conditions/risks identified: None.  Next appointment: 08/11/19 @ 9:20 AM for AWV. Declined setting up a f/u apt with PCP at this time.    Preventive Care 6165 Years and Older, Female Preventive care refers to lifestyle choices and visits with your health care provider that can promote health and wellness. What does preventive care include?  A yearly physical exam. This is also called an annual well check.  Dental exams once or twice a year.  Routine eye exams. Ask your health care provider how often you should have your eyes checked.  Personal lifestyle choices, including:  Daily care of your teeth and gums.  Regular physical activity.  Eating a healthy diet.  Avoiding tobacco and drug use.  Limiting alcohol use.  Practicing safe sex.  Taking low-dose aspirin every day.  Taking vitamin and mineral supplements as recommended by your health care provider. What happens during an annual well check? The services and screenings done by your health care provider during your annual well check will depend on your age, overall health, lifestyle risk factors, and family history of disease. Counseling  Your health care provider may ask you questions about your:  Alcohol use.  Tobacco use.  Drug  use.  Emotional well-being.  Home and relationship well-being.  Sexual activity.  Eating habits.  History of falls.  Memory and ability to understand (cognition).  Work and work Astronomerenvironment.  Reproductive health. Screening  You may have the following tests or measurements:  Height, weight, and BMI.  Blood pressure.  Lipid and cholesterol levels. These may be checked every 5 years, or more frequently if you are over 395 years old.  Skin check.  Lung cancer screening. You may have this screening every year starting at age 72 if you have a 30-pack-year history of smoking and currently smoke or have quit within the past 15 years.  Fecal occult blood test (FOBT) of the stool. You may have this test every year starting at age 72.  Flexible sigmoidoscopy or colonoscopy. You may have a sigmoidoscopy every 5 years or a colonoscopy every 10 years starting at age 650.  Hepatitis C blood test.  Hepatitis B blood test.  Sexually transmitted disease (STD) testing.  Diabetes screening. This is done by checking your blood sugar (glucose) after you have not eaten for a while (fasting). You may have this done every 1-3 years.  Bone density scan. This is done to screen for osteoporosis. You may have this done starting at age 72.  Mammogram. This may be done every 1-2 years. Talk to your health care provider about how often you should have regular mammograms. Talk with your health care provider about your test results, treatment options, and if necessary, the need for more tests. Vaccines  Your health care provider may recommend certain vaccines, such as:  Influenza vaccine. This is recommended  every year.  Tetanus, diphtheria, and acellular pertussis (Tdap, Td) vaccine. You may need a Td booster every 10 years.  Zoster vaccine. You may need this after age 38.  Pneumococcal 13-valent conjugate (PCV13) vaccine. One dose is recommended after age 50.  Pneumococcal polysaccharide  (PPSV23) vaccine. One dose is recommended after age 36. Talk to your health care provider about which screenings and vaccines you need and how often you need them. This information is not intended to replace advice given to you by your health care provider. Make sure you discuss any questions you have with your health care provider. Document Released: 01/12/2016 Document Revised: 09/04/2016 Document Reviewed: 10/17/2015 Elsevier Interactive Patient Education  2017 [employer] Prevention in the Home Falls can cause injuries. They can happen to people of all ages. There are many things you can do to make your home safe and to help prevent falls. What can I do on the outside of my home?  Regularly fix the edges of walkways and driveways and fix any cracks.  Remove anything that might make you trip as you walk through a door, such as a raised step or threshold.  Trim any bushes or trees on the path to your home.  Use bright outdoor lighting.  Clear any walking paths of anything that might make someone trip, such as rocks or tools.  Regularly check to see if handrails are loose or broken. Make sure that both sides of any steps have handrails.  Any raised decks and porches should have guardrails on the edges.  Have any leaves, snow, or ice cleared regularly.  Use sand or salt on walking paths during winter.  Clean up any spills in your garage right away. This includes oil or grease spills. What can I do in the bathroom?  Use night lights.  Install grab bars by the toilet and in the tub and shower. Do not use towel bars as grab bars.  Use non-skid mats or decals in the tub or shower.  If you need to sit down in the shower, use a plastic, non-slip stool.  Keep the floor dry. Clean up any water that spills on the floor as soon as it happens.  Remove soap buildup in the tub or shower regularly.  Attach bath mats securely with double-sided non-slip rug tape.  Do not have  throw rugs and other things on the floor that can make you trip. What can I do in the bedroom?  Use night lights.  Make sure that you have a light by your bed that is easy to reach.  Do not use any sheets or blankets that are too big for your bed. They should not hang down onto the floor.  Have a firm chair that has side arms. You can use this for support while you get dressed.  Do not have throw rugs and other things on the floor that can make you trip. What can I do in the kitchen?  Clean up any spills right away.  Avoid walking on wet floors.  Keep items that you use a lot in easy-to-reach places.  If you need to reach something above you, use a strong step stool that has a grab bar.  Keep electrical cords out of the way.  Do not use floor polish or wax that makes floors slippery. If you must use wax, use non-skid floor wax.  Do not have throw rugs and other things on the floor that can make you trip. What  can I do with my stairs?  Do not leave any items on the stairs.  Make sure that there are handrails on both sides of the stairs and use them. Fix handrails that are broken or loose. Make sure that handrails are as long as the stairways.  Check any carpeting to make sure that it is firmly attached to the stairs. Fix any carpet that is loose or worn.  Avoid having throw rugs at the top or bottom of the stairs. If you do have throw rugs, attach them to the floor with carpet tape.  Make sure that you have a light switch at the top of the stairs and the bottom of the stairs. If you do not have them, ask someone to add them for you. What else can I do to help prevent falls?  Wear shoes that:  Do not have high heels.  Have rubber bottoms.  Are comfortable and fit you well.  Are closed at the toe. Do not wear sandals.  If you use a stepladder:  Make sure that it is fully opened. Do not climb a closed stepladder.  Make sure that both sides of the stepladder are  locked into place.  Ask someone to hold it for you, if possible.  Clearly mark and make sure that you can see:  Any grab bars or handrails.  First and last steps.  Where the edge of each step is.  Use tools that help you move around (mobility aids) if they are needed. These include:  Canes.  Walkers.  Scooters.  Crutches.  Turn on the lights when you go into a dark area. Replace any light bulbs as soon as they burn out.  Set up your furniture so you have a clear path. Avoid moving your furniture around.  If any of your floors are uneven, fix them.  If there are any pets around you, be aware of where they are.  Review your medicines with your doctor. Some medicines can make you feel dizzy. This can increase your chance of falling. Ask your doctor what other things that you can do to help prevent falls. This information is not intended to replace advice given to you by your health care provider. Make sure you discuss any questions you have with your health care provider. Document Released: 10/12/2009 Document Revised: 05/23/2016 Document Reviewed: 01/20/2015 Elsevier Interactive Patient Education  2017 [employer].

## 2018-10-01 IMAGING — CR DG LUMBAR SPINE COMPLETE 4+V
1 series · 5 of 5 positions shown · non-contrast
Comparison: None.

CLINICAL DATA: Right sciatica, back pain, hip pain

EXAM:
LUMBAR SPINE - COMPLETE 4+ VIEW

[Series 1: dg lumbar spine complete 4 +v · 0.14mm/px · 5 of 5 slices shown]
[im 1/5]
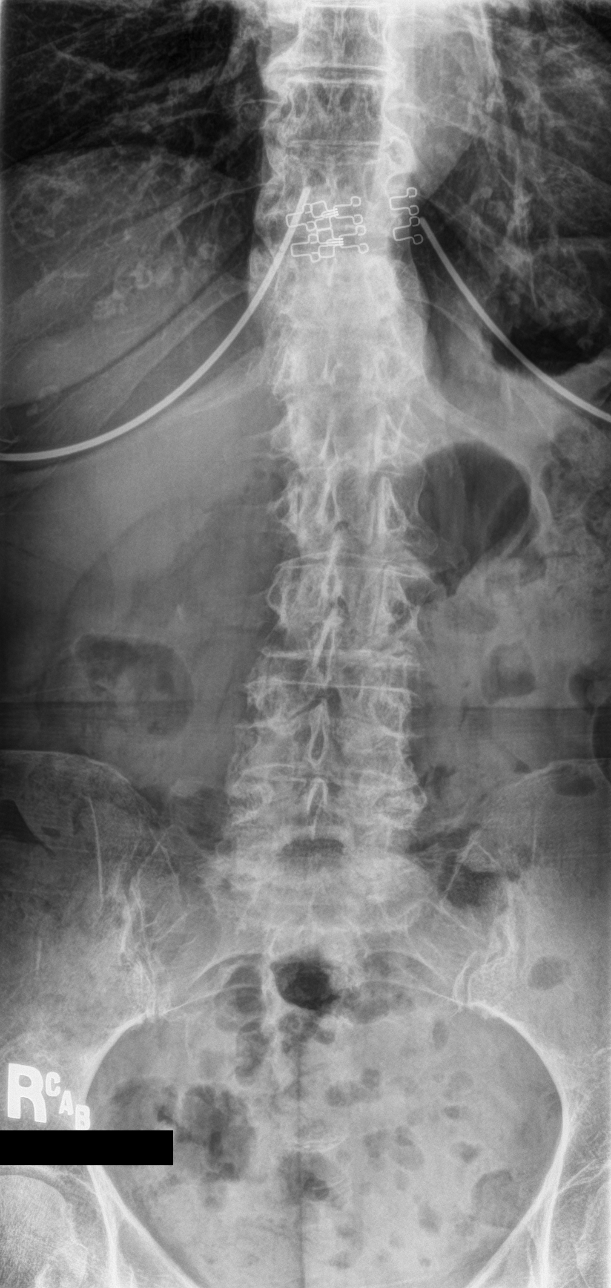
[im 2/5]
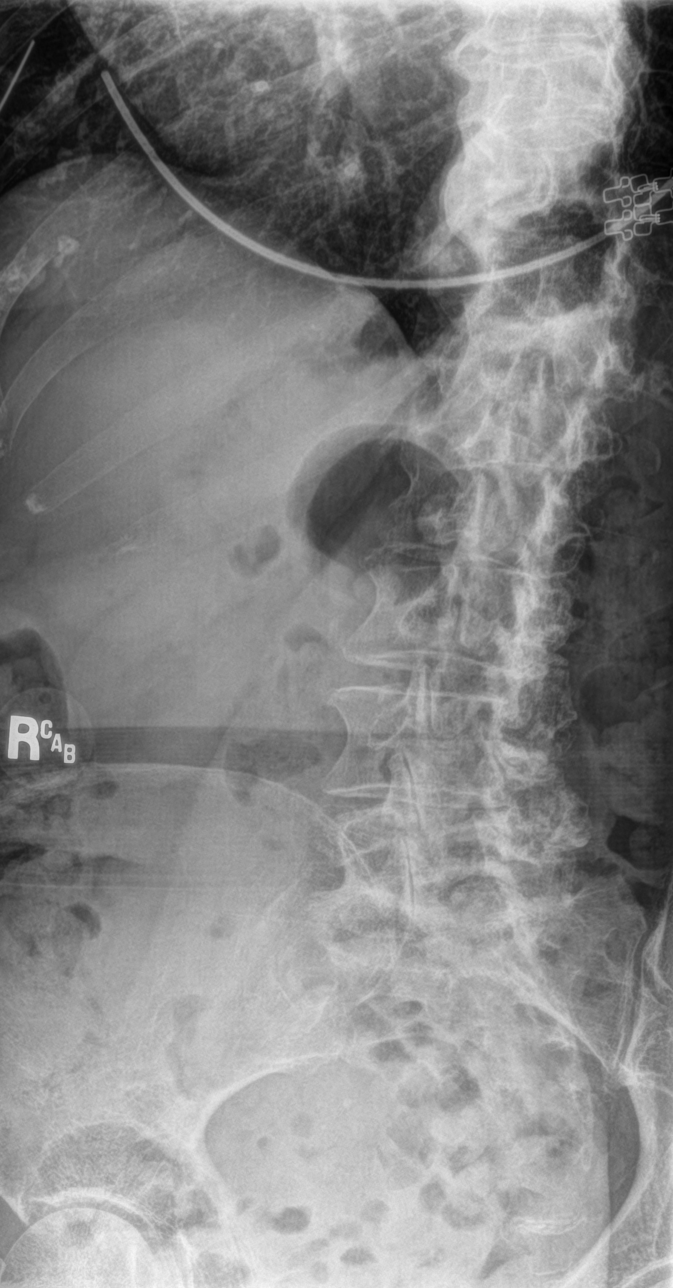
[im 3/5]
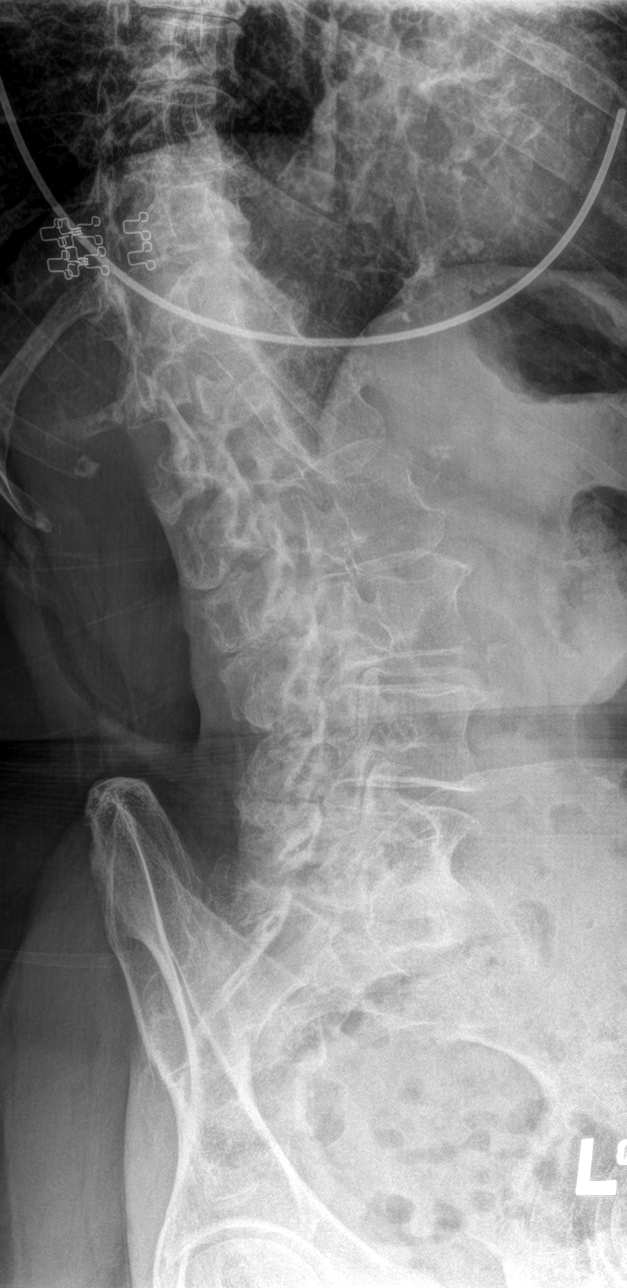
[im 4/5]
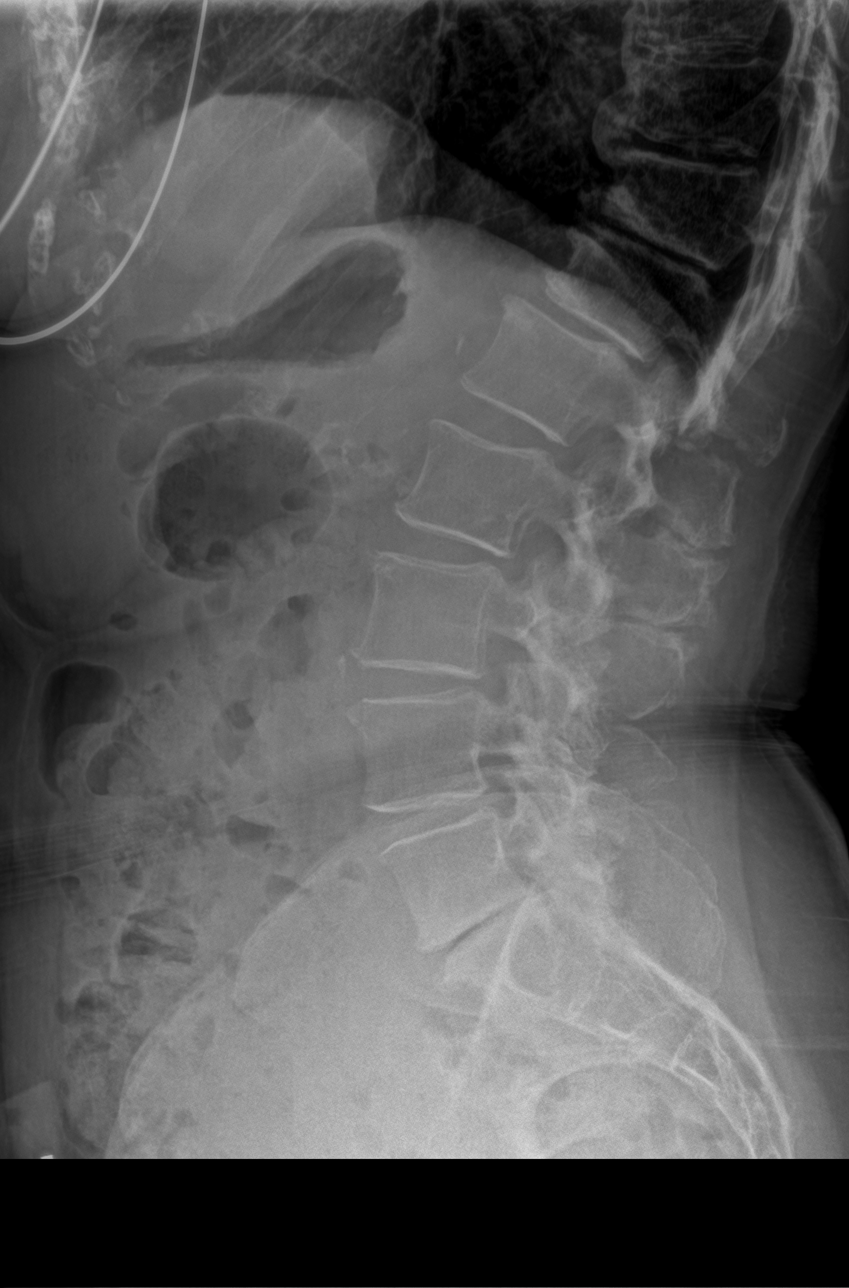
[im 5/5]
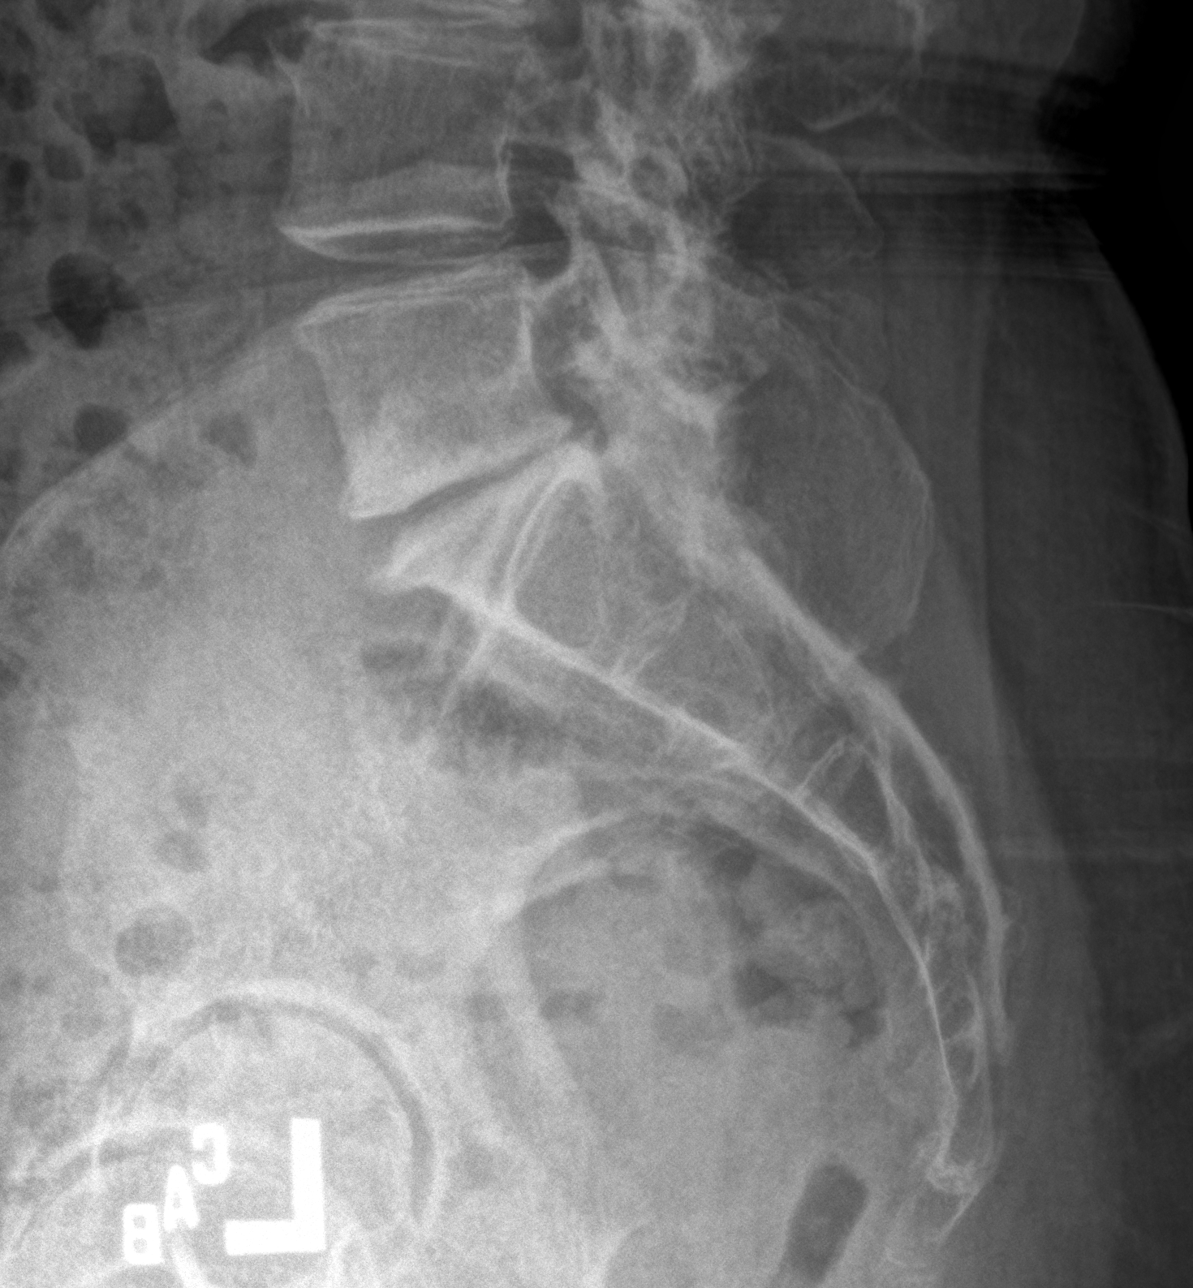

[5 of 5 positions shown; findings below may reference images not displayed]

FINDINGS: Degenerative disc disease changes at L5-S1 with disc space narrowing
and vacuum disc. Advanced diffuse degenerative facet disease
bilaterally. 10 mm retrolisthesis of T12 on L1. No fracture. SI
joints are symmetric and unremarkable.
IMPRESSION: Advanced diffuse degenerative facet disease throughout the lumbar
spine. Grade 1 anterolisthesis T12 on L1. Degenerative disc disease
at L5-S1. No acute findings.

## 2019-07-29 ENCOUNTER — Telehealth: Payer: Self-pay

## 2019-07-29 NOTE — Telephone Encounter (Signed)
Called pt to inquire about telephonic AWV and spoke with daughter who declined the visit currently due to pt living in an assisted living facility and being on lock down. Daughter to [facility] when pt is able to get out and r/s apt.

## 2019-08-11 ENCOUNTER — Ambulatory Visit: Payer: Self-pay

## 2019-11-03 DIAGNOSIS — M1611 Unilateral primary osteoarthritis, right hip: Secondary | ICD-10-CM

## 2019-11-03 DIAGNOSIS — I10 Essential (primary) hypertension: Secondary | ICD-10-CM

## 2019-11-03 DIAGNOSIS — R413 Other amnesia: Secondary | ICD-10-CM | POA: Diagnosis not present

## 2019-11-03 DIAGNOSIS — F419 Anxiety disorder, unspecified: Secondary | ICD-10-CM

## 2020-01-03 ENCOUNTER — Telehealth: Payer: Self-pay

## 2020-01-03 NOTE — Telephone Encounter (Signed)
Copied from CRM [id number]. Topic: General - Inquiry >> Jan 03, 2020 11:54 AM Leafy Ro wrote: Reason for CRM: pt has dementia and received jury summon for 02-16-2020. Pt needs a letter to be excused for jury duty. Juror number is [phone] with [place]. Pt daughter Orpha Bur has POA and need the letter as a possible. Please call Orpha Bur once letter has been written

## 2020-01-10 NOTE — Telephone Encounter (Signed)
Letter placed up front for pick up. Left message on voice message system that letter is ready.

## 2020-01-27 ENCOUNTER — Other Ambulatory Visit (INDEPENDENT_AMBULATORY_CARE_PROVIDER_SITE_OTHER): Payer: Medicare Other | Admitting: *Deleted

## 2020-01-27 ENCOUNTER — Telehealth: Payer: Self-pay

## 2020-01-27 DIAGNOSIS — R8290 Unspecified abnormal findings in urine: Secondary | ICD-10-CM

## 2020-01-27 LAB — POCT URINALYSIS DIPSTICK
Appearance: ABNORMAL
Bilirubin, UA: NEGATIVE
Blood, UA: NEGATIVE
Glucose, UA: NEGATIVE
Ketones, UA: NEGATIVE
Leukocytes, UA: NEGATIVE
Nitrite, UA: POSITIVE
Odor: ABNORMAL
Protein, UA: NEGATIVE
Spec Grav, UA: 1.02 (ref 1.010–1.025)
Urobilinogen, UA: 0.2 E.U./dL
pH, UA: 6 (ref 5.0–8.0)

## 2020-01-27 NOTE — Telephone Encounter (Signed)
I spoke with CNA on floor at [facility] she did not know who had contacted our office and had forwarded me to her supervisor. When I spoke with supervisor they said that patient had strange unusual behavior and strong urinary odor. She states that U/A sample was collected this afternoon and a med tech will drop sample off to our office. KW

## 2020-01-27 NOTE — Telephone Encounter (Signed)
Copied from CRM [id number]. Topic: General - Other >> Jan 27, 2020  1:37 PM Dalphine Handing A wrote: Nurse from [employer] [facility] called to see if they can bring in urine sample for patient and would like to speak with nurse in regards to patients symptoms. Best contact [phone]

## 2020-01-28 NOTE — Telephone Encounter (Signed)
What behaviors were they reporting  ? No falls or trauma ?

## 2020-01-31 ENCOUNTER — Telehealth: Payer: Self-pay

## 2020-01-31 ENCOUNTER — Other Ambulatory Visit: Payer: Self-pay | Admitting: Adult Health

## 2020-01-31 ENCOUNTER — Other Ambulatory Visit: Payer: Self-pay

## 2020-01-31 DIAGNOSIS — R8290 Unspecified abnormal findings in urine: Secondary | ICD-10-CM

## 2020-01-31 LAB — CULTURE, URINE COMPREHENSIVE

## 2020-01-31 MED ORDER — NITROFURANTOIN MONOHYD MACRO 100 MG PO CAPS: 100.0000 mg | ORAL_CAPSULE | Freq: Two times a day (BID) | ORAL | 0 refills | Status: DC

## 2020-01-31 NOTE — Telephone Encounter (Signed)
Kathryn Morales,  I see an order in medication list from Dr. Sullivan Lone to end 02/05/20 , I just saw that urine results in chart as it did not result to me.  If medication has not been sent please fax over order for Macrobid 100 mg po every 12 hours x 5 days. # 10.   Thanks, Marvell Fuller MSN, AGNP-C, FNP-C       CULTURE, URINE COMPREHENSIVE (Order [id number]) Contains abnormal data CULTURE, URINE COMPREHENSIVE Order: [id number] Status:  Preliminary result   Visible to patient:  No (not released) Next appt:  None Dx:  Bad odor of urine  UR      Component 4 d ago  Urine Culture, Comprehensive Preliminary reportAbnormal  P   Organism ID, Bacteria Escherichia coliAbnormal  P   Comment: Greater than 100,000 colony forming units per mL  Organism ID, Bacteria Comment P   Comment: Microbiological testing to rule out the presence of possible pathogens  is in progress.   Resulting Agency [facility]    Narrative Performed by: Verdell Carmine Performed at:  [address]  [address] [place], [place]  [id number]  Lab Director: Jolene Schimke MD, Phone:  [phone]

## 2020-01-31 NOTE — Telephone Encounter (Signed)
Prescription sent and faxed over to patient she is in [facility].

## 2020-01-31 NOTE — Telephone Encounter (Signed)
I spoke to [place] (CMA) and she states that Dr.Gilbert did send in the medication and they had already dripped the urine for the patient.FYI

## 2020-01-31 NOTE — Telephone Encounter (Signed)
Spoke to [employer] w/ [facility] and she reports that the patient is disoriented and confused. Selena Batten reports that the patient has not had any falls. Also she states that a order could be fax [phone] if patient needs a urine sample to be send to our office.Please advise.

## 2020-01-31 NOTE — Telephone Encounter (Signed)
Thank you for the clarification.

## 2020-02-01 ENCOUNTER — Telehealth: Payer: Self-pay

## 2020-02-01 DIAGNOSIS — R8290 Unspecified abnormal findings in urine: Secondary | ICD-10-CM

## 2020-02-01 MED ORDER — NITROFURANTOIN MONOHYD MACRO 100 MG PO CAPS: 100.0000 mg | ORAL_CAPSULE | Freq: Two times a day (BID) | ORAL | 0 refills | Status: DC

## 2020-02-01 MED ORDER — NITROFURANTOIN MONOHYD MACRO 100 MG PO CAPS
100.0000 mg | ORAL_CAPSULE | Freq: Two times a day (BID) | ORAL | 0 refills | Status: DC
Start: 2020-02-01 — End: 2020-02-01

## 2020-02-01 NOTE — Telephone Encounter (Signed)
Medication sent to [facility] per [facility].

## 2020-02-01 NOTE — Telephone Encounter (Signed)
Linda with [facility] called this morning saying they have not received the order for the antibiotic for patients UTI.  FAX#  [phone]  CB#  [phone]

## 2020-04-13 ENCOUNTER — Telehealth: Payer: Self-pay

## 2020-04-13 NOTE — Telephone Encounter (Signed)
Signed and ready for fax back to [facility].

## 2020-04-13 NOTE — Telephone Encounter (Signed)
Have you seen this? I have not  Copied from CRM [id number]. Topic: General - Inquiry >> Apr 13, 2020 12:21 PM Reggie Pile, [place] wrote: Reason for CRM: Bonita Quin, with Assisted Living, has faxed over patient's current med list twice and was wondering if PCP has had a chance to review and sign. Please advise and call back is [phone]

## 2020-06-30 ENCOUNTER — Telehealth: Payer: Self-pay

## 2020-06-30 NOTE — Telephone Encounter (Signed)
Copied from CRM [id number]. Topic: General - Other >> Jun 30, 2020 11:18 AM Elliot Gault wrote: Caller name: Oday,Katy  Relation to pt: daughter  Call back number: [phone]   Reason for CRM:  patient received a [facility] Primary Change request from [facility] signed by Dr. Shella Spearing. Patient insurance has not changed and patient does what to changed PCP. Daughter unclear on what to do with form.   Advised patient to also follow up with [facility]

## 2020-06-30 NOTE — Telephone Encounter (Signed)
Copied from CRM [id number]. Topic: General - Other >> Jun 30, 2020 11:18 AM Elliot Gault wrote: Caller name: Oday,Katy  Relation to pt: daughter  Call back number: [phone]   Reason for CRM:  patient received a [facility] Primary Change request from [facility] signed by Dr. Shella Spearing. Patient insurance has not changed and patient does what to changed PCP. Daughter unclear on what to do with form.   Advised patient to also follow up with [facility] >> Jun 30, 2020 11:56 AM Gwenlyn Fudge wrote: Pts daughter called back stating that she spoke with [facility]. Pts daughter states that the insurance does not know anything about the form. Pts daughter states that she just wanted office to be aware. Please advise.

## 2020-07-25 ENCOUNTER — Telehealth: Payer: Self-pay
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
## 2020-07-25 NOTE — Telephone Encounter (Signed)
That's fine, can order u/a and culture

## 2020-07-25 NOTE — Telephone Encounter (Signed)
Please review for Dennis.    Thanks,   -Lamarius Dirr  

## 2020-07-26 ENCOUNTER — Other Ambulatory Visit: Payer: Self-pay | Admitting: Family Medicine

## 2020-07-26 NOTE — Telephone Encounter (Signed)
Order faxed to [facility].  

## 2020-07-27 LAB — UA/M W/RFLX CULTURE, COMP
Bilirubin, UA: NEGATIVE
Glucose, UA: NEGATIVE
Leukocytes,UA: NEGATIVE
Nitrite, UA: NEGATIVE
Protein,UA: NEGATIVE
RBC, UA: NEGATIVE
Specific Gravity, UA: 1.02 (ref 1.005–1.030)
Urobilinogen, Ur: 0.2 mg/dL (ref 0.2–1.0)
pH, UA: 5.5 (ref 5.0–7.5)

## 2020-07-27 LAB — MICROSCOPIC EXAMINATION
Bacteria, UA: NONE SEEN
Casts: NONE SEEN /lpf
RBC: NONE SEEN /hpf (ref 0–2)

## 2020-08-09 ENCOUNTER — Telehealth: Payer: Self-pay

## 2020-08-09 NOTE — Telephone Encounter (Signed)
Copied from CRM [id number]. Topic: General - Inquiry >> Aug 09, 2020 11:13 AM Leary Roca wrote: Reason for CRM: Rizza calling from [facility] for a diagnostic code for mutual pt. She will also fax a copy to office   Call back [phone]

## 2021-10-25 ENCOUNTER — Encounter: Payer: Self-pay | Admitting: Emergency Medicine

## 2021-10-25 ENCOUNTER — Other Ambulatory Visit: Payer: Self-pay

## 2021-10-25 ENCOUNTER — Emergency Department
Admission: EM | Admit: 2021-10-25 | Discharge: 2021-10-25 | Disposition: A | Discharge status: Home / Self Care | Payer: Medicare PPO | Attending: Emergency Medicine | Admitting: Emergency Medicine

## 2021-10-25 ENCOUNTER — Emergency Department: Payer: Medicare PPO

## 2021-10-25 ENCOUNTER — Telehealth: Payer: Self-pay

## 2021-10-25 DIAGNOSIS — W19XXXA Unspecified fall, initial encounter: Secondary | ICD-10-CM | POA: Diagnosis not present

## 2021-10-25 DIAGNOSIS — Y92129 Unspecified place in nursing home as the place of occurrence of the external cause: Secondary | ICD-10-CM | POA: Diagnosis not present

## 2021-10-25 DIAGNOSIS — S0990XA Unspecified injury of head, initial encounter: Secondary | ICD-10-CM | POA: Diagnosis present

## 2021-10-25 DIAGNOSIS — I10 Essential (primary) hypertension: Secondary | ICD-10-CM | POA: Insufficient documentation

## 2021-10-25 DIAGNOSIS — F0390 Unspecified dementia without behavioral disturbance: Secondary | ICD-10-CM | POA: Diagnosis not present

## 2021-10-25 DIAGNOSIS — Z79899 Other long term (current) drug therapy: Secondary | ICD-10-CM | POA: Insufficient documentation

## 2021-10-25 NOTE — ED Triage Notes (Signed)
Pt in via EMS from [facility] memory care with c/o fall. Pt had unwitnessed fall in the hall. No obvious injuroes noted, pt does not take blood thinners. Pt non verbal with dementia. 132/70, HR 76, 94% RA

## 2021-10-25 NOTE — Telephone Encounter (Signed)
Copied from CRM [id number]. Topic: General - Other >> Oct 25, 2021  1:47 PM Pawlus, Maxine Glenn A wrote: Reason for CRM: [name] called in regarding POA forms, pt wanted to know if we had those on file for this pt. Please advise.

## 2021-10-25 NOTE — Discharge Instructions (Signed)
Use a wheelchair for the next 2 to 3 days or until PT can evaluate for wheelchair vs needs walker I would recommend she wear regular shoes in the hallways as crocs may increase her chance of falls Return if worsening

## 2021-10-25 NOTE — ED Notes (Signed)
Ambulation trial performed, pt able to bear weight and take steps, appears slightly hesitant with steps, provider recommending utilization of wc/walker next few days then advance to independent ambulation.  Recommend PT eval at facility

## 2021-10-25 NOTE — Telephone Encounter (Signed)
LM on VM that we do have POA forms on file

## 2021-10-25 NOTE — ED Provider Notes (Signed)
[facility] Emergency Department Provider Note  ____________________________________________   Event Date/Time   First MD Initiated Contact with Patient 10/25/21 1031     (approximate)  I have reviewed the triage vital signs and the nursing notes.   HISTORY  Chief Complaint Fall    HPI Kathryn Morales is a 75 y.o. female [place] emergency department after a unwitnessed fall at [facility].  Concerns for head injury.  Patient is unable to give an account of the fall due to her dementia.  Her daughter is present and states that EMS told her she just tripped in the hallway.  There was nothing in the hallway near her to cause her to trip.  The patient was wearing crocs today which she does not normally wear per the daughter.  Past Medical History:  Diagnosis Date   Anxiety    Hyperlipidemia    Hypertension     Patient Active Problem List   Diagnosis Date Noted   Arthritis of right hip 06/26/2018   Degenerative disc disease, lumbar 06/26/2018   Impaired memory 11/26/2016   Anxiety and depression 05/07/2016   Carotid artery narrowing 05/07/2016   Hypercholesteremia 02/14/2016   History of colon polyps 06/09/2008   BP (high blood pressure) 02/23/2008   ADD (attention deficit disorder) 06/16/2007   Clinical depression 03/31/2007   HLD (hyperlipidemia) 03/31/2007    Past Surgical History:  Procedure Laterality Date   BREAST SURGERY     TONSILLECTOMY AND ADENOIDECTOMY     TUBAL LIGATION      Prior to Admission medications   Medication Sig Start Date End Date Taking? Authorizing Provider  ARIPiprazole (ABILIFY) 2 MG tablet Take 1 tablet (2 mg total) by mouth at bedtime. 06/26/18   Chrismon, Maurine Minister E, PA-C  donepezil (ARICEPT) 10 MG tablet Take 1 tablet (10 mg total) by mouth at bedtime. 01/10/17   Chrismon, Jodell Cipro, PA-C  nitrofurantoin, macrocrystal-monohydrate, (MACROBID) 100 MG capsule Take 1 capsule (100 mg total) by mouth 2 (two) times daily.  02/01/20   Maple Hudson., MD  predniSONE (STERAPRED UNI-PAK 21 TAB) 10 MG (21) TBPK tablet Taper dose pack as directed on package. Patient not taking: Reported on 06/26/2018 10/17/17   Chrismon, Jodell Cipro, PA-C  simvastatin (ZOCOR) 40 MG tablet Take 1 tablet (40 mg total) by mouth at bedtime. 07/22/16   Chrismon, Jodell Cipro, PA-C    Allergies Patient has no known allergies.  Family History  Problem Relation Age of Onset   Coronary artery disease Mother    Heart disease Mother    Lupus Sister    Diabetes Brother    Asthma Maternal Grandfather    Breast cancer Paternal Grandmother    Heart attack Paternal Grandfather    Hypothyroidism Daughter    Anxiety disorder Daughter    Hypothyroidism Maternal Grandmother     Social History Social History   Tobacco Use   Smoking status: Never   Smokeless tobacco: Never  Vaping Use   Vaping Use: Never used  Substance Use Topics   Alcohol use: Yes    Comment: social- minimal   Drug use: No    Review of Systems Unable to assess due to patient's dementia Constitutional: No fever/chills Eyes: No visual changes. ENT: No sore throat. Respiratory: Denies cough Cardiovascular: Denies chest pain Gastrointestinal: Denies abdominal pain Genitourinary: Negative for dysuria. Musculoskeletal: Negative for back pain. Skin: Negative for rash. Psychiatric: no mood changes,     ____________________________________________   PHYSICAL EXAM:  VITAL SIGNS: ED Triage Vitals  Enc Vitals Group     BP 10/25/21 0822 128/69     Pulse Rate 10/25/21 0822 89     Resp 10/25/21 0822 16     Temp 10/25/21 0822 98.9 F (37.2 C)     Temp Source 10/25/21 0822 Axillary     SpO2 10/25/21 0822 95 %     Weight 10/25/21 0815 120 lb (54.4 kg)     Height 10/25/21 0815 5\' 3"  (1.6 m)     Head Circumference --      Peak Flow --      Pain Score --      Pain Loc --      Pain Edu? --      Excl. in GC? --     Constitutional: Alert and oriented. Well  appearing and in no acute distress. Eyes: Conjunctivae are normal.  Head: Atraumatic. Nose: No congestion/rhinnorhea. Mouth/Throat: Mucous membranes are moist.   Neck:  supple no lymphadenopathy noted Cardiovascular: Normal rate, regular rhythm.  Respiratory: Normal respiratory effort.  No retractions,  GU: deferred Musculoskeletal: FROM all extremities, warm and well perfused, patient is able to stand with help Neurologic:  Normal speech and language.  Skin:  Skin is warm, dry and intact. No rash noted. Psychiatric: Mood and affect are normal. Speech and behavior are normal.  ____________________________________________   LABS (all labs ordered are listed, but only abnormal results are displayed)  Labs Reviewed - No data to display ____________________________________________   ____________________________________________  RADIOLOGY  CT of the head and C-spine  ____________________________________________   PROCEDURES  Procedure(s) performed: No  Procedures    ____________________________________________   INITIAL IMPRESSION / ASSESSMENT AND PLAN / ED COURSE  Pertinent labs & imaging results that were available during my care of the patient were reviewed by me and considered in my medical decision making (see chart for details).   The patient is a 75 year old female presents after unwitnessed fall.  See HPI.  Physical exam shows patient to appear stable.  She is able to stand with help.  CT of the head and C-spine reviewed by me confirmed by radiology to be negative for any acute abnormality   I did discuss these findings with the family.  The patient was able to stand with help.  However I feel that she is still a little unsteady on her feet and should remain in a wheelchair at this facility for the next 2 days.  They should have physical therapy evaluate her to see if she needs a walker.  They should not let her wear crocs as a feel these cause her to be  unstable.  Return emergency department worsening.  Family is in agreement with treatment plan.  Discharged stable condition.     Kathryn Morales was evaluated in Emergency Department on 10/25/2021 for the symptoms described in the history of present illness. She was evaluated in the context of the global COVID-19 pandemic, which necessitated consideration that the patient might be at risk for infection with the SARS-CoV-2 virus that causes COVID-19. Institutional protocols and algorithms that pertain to the evaluation of patients at risk for COVID-19 are in a state of rapid change based on information released by regulatory bodies including the CDC and federal and state organizations. These policies and algorithms were followed during the patient's care in the ED.    As part of my medical decision making, I reviewed the following data within the electronic MEDICAL RECORD NUMBER History obtained  from family, Nursing notes reviewed and incorporated, Old chart reviewed, Radiograph reviewed , Notes from prior ED visits, and [place] Controlled Substance Database  ____________________________________________   FINAL CLINICAL IMPRESSION(S) / ED DIAGNOSES  Final diagnoses:  Fall, initial encounter      NEW MEDICATIONS STARTED DURING THIS VISIT:  New Prescriptions   No medications on file     Note:  This document was prepared using Dragon voice recognition software and may include unintentional dictation errors.    Faythe Ghee, PA-C 10/25/21 1056    Sharman Cheek, MD 10/30/21 2329

## 2022-06-14 ENCOUNTER — Emergency Department: Payer: Medicare PPO

## 2022-06-14 ENCOUNTER — Encounter: Payer: Self-pay | Admitting: Emergency Medicine

## 2022-06-14 DIAGNOSIS — S0181XA Laceration without foreign body of other part of head, initial encounter: Secondary | ICD-10-CM | POA: Diagnosis not present

## 2022-06-14 DIAGNOSIS — I10 Essential (primary) hypertension: Secondary | ICD-10-CM | POA: Insufficient documentation

## 2022-06-14 DIAGNOSIS — Y92129 Unspecified place in nursing home as the place of occurrence of the external cause: Secondary | ICD-10-CM | POA: Insufficient documentation

## 2022-06-14 DIAGNOSIS — S0990XA Unspecified injury of head, initial encounter: Secondary | ICD-10-CM | POA: Diagnosis present

## 2022-06-14 DIAGNOSIS — F0390 Unspecified dementia without behavioral disturbance: Secondary | ICD-10-CM | POA: Diagnosis not present

## 2022-06-14 DIAGNOSIS — W1839XA Other fall on same level, initial encounter: Secondary | ICD-10-CM | POA: Diagnosis not present

## 2022-06-14 NOTE — ED Triage Notes (Signed)
Pt presents via EMS from [facility] after a witnessed fall. Pt has a hx of Dementia. Pt is at her baseline per facility and has a small laceration above her right ear bleeding controlled without pressure. Denies LOC - not on thinners.

## 2022-06-15 ENCOUNTER — Emergency Department
Admission: EM | Admit: 2022-06-15 | Discharge: 2022-06-15 | Disposition: A | Discharge status: Skilled Nursing Facility | Payer: Medicare PPO | Attending: Emergency Medicine | Admitting: Emergency Medicine

## 2022-06-15 DIAGNOSIS — W19XXXA Unspecified fall, initial encounter: Secondary | ICD-10-CM

## 2022-06-15 DIAGNOSIS — S0990XA Unspecified injury of head, initial encounter: Secondary | ICD-10-CM

## 2022-06-15 NOTE — ED Notes (Signed)
Pt's daughter is transporting pt back to [facility].

## 2022-06-15 NOTE — ED Provider Notes (Signed)
   [facility] Provider Note    Event Date/Time   First MD Initiated Contact with Patient 06/15/22 0104     (approximate)  History   Chief Complaint: Fall  HPI  Kathryn Morales is a 76 y.o. female with a past medical history of hypertension, hyperlipidemia, dementia, presents to the emergency department after a fall.  Patient is coming from her nursing facility where she had an unwitnessed fall.  Patient has a small cut above her right ear.  Daughter is here with the patient states the patient is acting at her baseline.  Patient is calm cooperative and pleasant throughout my evaluation.  Does not complain of any discomfort or pain.  Physical Exam   Triage Vital Signs: ED Triage Vitals  Enc Vitals Group     BP 06/14/22 2336 (!) 161/70     Pulse Rate 06/14/22 2336 (!) 55     Resp 06/14/22 2336 20     Temp 06/14/22 2336 98 F (36.7 C)     Temp Source 06/14/22 2336 Oral     SpO2 06/14/22 2336 96 %     Weight 06/14/22 2338 123 lb 7.3 oz (56 kg)     Height 06/14/22 2338 5\' 3"  (1.6 m)     Head Circumference --      Peak Flow --      Pain Score --      Pain Loc --      Pain Edu? --      Excl. in GC? --     Most recent vital signs: Vitals:   06/14/22 2336  BP: (!) 161/70  Pulse: (!) 55  Resp: 20  Temp: 98 F (36.7 C)  SpO2: 96%    General: Awake, no distress.  CV:  Good peripheral perfusion.  Regular rate and rhythm  Resp:  Normal effort.  Equal breath sounds bilaterally.  Abd:  No distention.  Soft, nontender.  No rebound or guarding. Other:  Small abrasion/laceration approximately 0.5 cm above the right ear, hemostatic   ED Results / Procedures / Treatments    RADIOLOGY  I have reviewed the CT images no obvious intracranial hemorrhage on my interpretation. Radiology is read the CT scan is negative for acute abnormality in the head or C-spine.  MEDICATIONS ORDERED IN ED: Medications - No data to display   IMPRESSION / MDM / ASSESSMENT  AND PLAN / ED COURSE  I reviewed the triage vital signs and the nursing notes.  Patient's presentation is most consistent with acute presentation with potential threat to life or bodily function.  Patient presents emergency department with a head injury after an unwitnessed fall at her nursing facility.  Patient has a history of dementia.  Daughter is here with the patient and states patient is acting at her baseline.  Patient has good range of motion all extremities with no pain elicited.  No concern for fracture.  Patient does have a small laceration that is hemostatic above the right ear.  I cleaned this and dermabonded it.  CT scans of the head and C-spine are negative for acute abnormality.  We will discharge patient home.  Daughter agreeable to plan of care.  FINAL CLINICAL IMPRESSION(S) / ED DIAGNOSES   Fall Head injury   Note:  This document was prepared using Dragon voice recognition software and may include unintentional dictation errors.   06/16/22, MD 06/15/22 [phone]

## 2022-06-15 NOTE — Discharge Instructions (Signed)
You have been seen in the emergency department after a fall and head injury.  The small cut has been repaired with glue.  You may wash the hair tomorrow and allowed to air dry.  Do not rub.  CT imaging today of the head and neck are normal.  Please follow-up with your doctor as needed.  Return to the emergency department for any symptom personally concerning to yourself or staff member.

## 2022-12-21 ENCOUNTER — Emergency Department: Payer: Medicare PPO

## 2022-12-21 ENCOUNTER — Emergency Department
Admission: EM | Admit: 2022-12-21 | Discharge: 2022-12-21 | Disposition: A | Discharge status: Skilled Nursing Facility | Payer: Medicare PPO | Attending: Emergency Medicine | Admitting: Emergency Medicine

## 2022-12-21 ENCOUNTER — Other Ambulatory Visit: Payer: Self-pay

## 2022-12-21 DIAGNOSIS — R55 Syncope and collapse: Secondary | ICD-10-CM

## 2022-12-21 DIAGNOSIS — F0390 Unspecified dementia without behavioral disturbance: Secondary | ICD-10-CM | POA: Diagnosis not present

## 2022-12-21 DIAGNOSIS — R112 Nausea with vomiting, unspecified: Secondary | ICD-10-CM | POA: Diagnosis not present

## 2022-12-21 DIAGNOSIS — R197 Diarrhea, unspecified: Secondary | ICD-10-CM | POA: Insufficient documentation

## 2022-12-21 DIAGNOSIS — K529 Noninfective gastroenteritis and colitis, unspecified: Secondary | ICD-10-CM

## 2022-12-21 DIAGNOSIS — R0689 Other abnormalities of breathing: Secondary | ICD-10-CM | POA: Insufficient documentation

## 2022-12-21 LAB — BASIC METABOLIC PANEL
Anion gap: 7 (ref 5–15)
BUN: 36 mg/dL — ABNORMAL HIGH (ref 8–23)
CO2: 33 mmol/L — ABNORMAL HIGH (ref 22–32)
Calcium: 9.4 mg/dL (ref 8.9–10.3)
Chloride: 105 mmol/L (ref 98–111)
Creatinine, Ser: 1.03 mg/dL — ABNORMAL HIGH (ref 0.44–1.00)
GFR, Estimated: 56 mL/min — ABNORMAL LOW (ref >60)
Glucose, Bld: 131 mg/dL — ABNORMAL HIGH (ref 70–99)
Potassium: 4.4 mmol/L (ref 3.5–5.1)
Sodium: 145 mmol/L (ref 135–145)

## 2022-12-21 LAB — CBC
HCT: 45.5 % (ref 36.0–46.0)
Hemoglobin: 14.4 g/dL (ref 12.0–15.0)
MCH: 31.6 pg (ref 26.0–34.0)
MCHC: 31.6 g/dL (ref 30.0–36.0)
MCV: 100 fL (ref 80.0–100.0)
Platelets: 127 10*3/uL — ABNORMAL LOW (ref 150–400)
RBC: 4.55 MIL/uL (ref 3.87–5.11)
RDW: 12.8 % (ref 11.5–15.5)
WBC: 7.1 10*3/uL (ref 4.0–10.5)
nRBC: 0 % (ref 0.0–0.2)

## 2022-12-21 LAB — TROPONIN I (HIGH SENSITIVITY)
Troponin I (High Sensitivity): 4 ng/L (ref <18)
Troponin I (High Sensitivity): 4 ng/L (ref <18)

## 2022-12-21 LAB — BRAIN NATRIURETIC PEPTIDE: B Natriuretic Peptide: 69.8 pg/mL (ref 0.0–100.0)

## 2022-12-21 MED ORDER — SODIUM CHLORIDE 0.9 % IV BOLUS
500.0000 mL | Freq: Once | INTRAVENOUS | Status: AC
  Administered 2022-12-21: 500 mL via INTRAVENOUS

## 2022-12-21 NOTE — ED Notes (Signed)
HR increased due to eating

## 2022-12-21 NOTE — ED Provider Notes (Signed)
[facility] Provider Note   Event Date/Time   First MD Initiated Contact with Patient 12/21/22 1304     (approximate) History  Near Syncope  HPI Kathryn Morales is a 76 y.o. female who presents via EMS from the [facility] care unit at [facility] for a syncopal episode.  Patient was having a bowel movement when she passed out and was assisted to the ground by staff.  EMS report that facility told them the patient has been having nausea/vomiting/diarrhea today.  However they do mention, that patient is at her baseline with history of dementia.   Physical Exam  Triage Vital Signs: ED Triage Vitals [12/21/22 1308]  Enc Vitals Group     BP 120/60     Pulse Rate 65     Resp 16     Temp 97.7 F (36.5 C)     Temp Source Temporal     SpO2 93 %     Weight 113 lb 12.1 oz (51.6 kg)     Height      Head Circumference      Peak Flow      Pain Score      Pain Loc      Pain Edu?      Excl. in GC?    Most recent vital signs: Vitals:   12/21/22 1308 12/21/22 1314  BP: 120/60   Pulse: 65 88  Resp: 16 16  Temp: 97.7 F (36.5 C)   SpO2: 93% 98%   General: Awake, cooperative CV:  Good peripheral perfusion.  Resp:  Normal effort.  Abd:  No distention.  Other:  Elderly the cachectic Caucasian female laying in bed in no acute distress ED Results / Procedures / Treatments  Labs (all labs ordered are listed, but only abnormal results are displayed) Labs Reviewed  BASIC METABOLIC PANEL - Abnormal; Notable for the following components:      Result Value   CO2 33 (*)    Glucose, Bld 131 (*)    BUN 36 (*)    Creatinine, Ser 1.03 (*)    GFR, Estimated 56 (*)    All other components within normal limits  CBC - Abnormal; Notable for the following components:   Platelets 127 (*)    All other components within normal limits  URINALYSIS, ROUTINE W REFLEX MICROSCOPIC  CBG MONITORING, ED    PROCEDURES: Critical Care performed: No Procedures MEDICATIONS ORDERED IN  ED: Medications - No data to display IMPRESSION / MDM / ASSESSMENT AND PLAN / ED COURSE  I reviewed the triage vital signs and the nursing notes.                             The patient is on the cardiac monitor to evaluate for evidence of arrhythmia and/or significant heart rate changes. Patient's presentation is most consistent with acute presentation with potential threat to life or bodily function. Patient presents with complaints of syncope/presyncope ED Workup:  CBC, BMP, Troponin, BNP, ECG, CXR Differential diagnosis includes HF, ICH, seizure, stroke, HOCM, ACS, aortic dissection, malignant arrhythmia, or GI bleed. Findings: Care of this patient will be signed out to the oncoming physician at the end of my shift.  All pertinent patient information conveyed and all questions answered.  All further care and disposition decisions will be made by the oncoming physician.   FINAL CLINICAL IMPRESSION(S) / ED DIAGNOSES   Final diagnoses:  Syncope and collapse  Rx / DC Orders   ED Discharge Orders     None      Note:  This document was prepared using Dragon voice recognition software and may include unintentional dictation errors.   Merwyn Katos, MD 12/21/22 [phone]

## 2022-12-21 NOTE — Discharge Instructions (Addendum)
Please seek medical attention for any high fevers, chest pain, shortness of breath, change in behavior, persistent vomiting, bloody stool or any other new or concerning symptoms.  

## 2022-12-21 NOTE — ED Triage Notes (Signed)
Pt to ED via [facility] from the memory care unit from [facility] for syncopal episode. Per EMS staff stated that pt was having a bowel movement and passed out. Pt was assisted to ground by staff. EMS reports that staff at facility states that pt has had N/V/D today. Pt is at her baseline with hx/o dementia.

## 2023-03-10 ENCOUNTER — Emergency Department
Admission: EM | Admit: 2023-03-10 | Discharge: 2023-03-10 | Disposition: A | Discharge status: Home / Self Care | Payer: Medicare PPO | Attending: Emergency Medicine | Admitting: Emergency Medicine

## 2023-03-10 ENCOUNTER — Emergency Department: Payer: Medicare PPO

## 2023-03-10 DIAGNOSIS — S0993XA Unspecified injury of face, initial encounter: Secondary | ICD-10-CM | POA: Diagnosis present

## 2023-03-10 DIAGNOSIS — S01512A Laceration without foreign body of oral cavity, initial encounter: Secondary | ICD-10-CM

## 2023-03-10 DIAGNOSIS — Z23 Encounter for immunization: Secondary | ICD-10-CM | POA: Insufficient documentation

## 2023-03-10 DIAGNOSIS — Y92129 Unspecified place in nursing home as the place of occurrence of the external cause: Secondary | ICD-10-CM | POA: Insufficient documentation

## 2023-03-10 DIAGNOSIS — W01198A Fall on same level from slipping, tripping and stumbling with subsequent striking against other object, initial encounter: Secondary | ICD-10-CM | POA: Insufficient documentation

## 2023-03-10 DIAGNOSIS — S022XXA Fracture of nasal bones, initial encounter for closed fracture: Secondary | ICD-10-CM | POA: Insufficient documentation

## 2023-03-10 DIAGNOSIS — S01511A Laceration without foreign body of lip, initial encounter: Secondary | ICD-10-CM | POA: Insufficient documentation

## 2023-03-10 DIAGNOSIS — G319 Degenerative disease of nervous system, unspecified: Secondary | ICD-10-CM | POA: Diagnosis not present

## 2023-03-10 DIAGNOSIS — W19XXXA Unspecified fall, initial encounter: Secondary | ICD-10-CM

## 2023-03-10 MED ORDER — CEPHALEXIN 500 MG PO CAPS: 500.0000 mg | ORAL_CAPSULE | Freq: Two times a day (BID) | ORAL | 0 refills | Status: DC

## 2023-03-10 MED ORDER — TETANUS-DIPHTH-ACELL PERTUSSIS 5-2.5-18.5 LF-MCG/0.5 IM SUSY
0.5000 mL | PREFILLED_SYRINGE | Freq: Once | INTRAMUSCULAR | Status: AC
  Administered 2023-03-10: 0.5 mL via INTRAMUSCULAR
  Filled 2023-03-10: qty 0.5

## 2023-03-10 MED ORDER — LIDOCAINE-EPINEPHRINE 2 %-1:100000 IJ SOLN
20.0000 mL | Freq: Once | INTRAMUSCULAR | Status: AC
  Administered 2023-03-10: 20 mL via INTRADERMAL
  Filled 2023-03-10: qty 1

## 2023-03-10 MED ORDER — MUPIROCIN 2 % EX OINT: 1.0000 | TOPICAL_OINTMENT | Freq: Two times a day (BID) | CUTANEOUS | 0 refills | Status: DC

## 2023-03-10 MED ORDER — CEPHALEXIN 500 MG PO CAPS: 500.0000 mg | ORAL_CAPSULE | Freq: Two times a day (BID) | ORAL | 0 refills | Status: AC

## 2023-03-10 MED ORDER — BACITRACIN ZINC 500 UNIT/GM EX OINT
TOPICAL_OINTMENT | Freq: Two times a day (BID) | CUTANEOUS | Status: DC
  Filled 2023-03-10: qty 0.9

## 2023-03-10 NOTE — ED Triage Notes (Addendum)
Pt arrives via EMS from [facility] due to a fall. Pt was found to be face down and bleeding from the nose. Pt also has a split upper lip. Pt is at her base line, which is incomprehensible speech. Pt is not on any blood thinners and is sitting up in bed resting.

## 2023-03-10 NOTE — Discharge Instructions (Signed)
Take the Keflex twice a day for 5 days  Apply triple antibiotic or mupirocin to the nasal bridge and oral wound 2-3 x daily  No hard food, chips, or sharp food and ideally no using a straw for 4-5 days

## 2023-03-10 NOTE — ED Provider Notes (Signed)
[facility] Provider Note    Event Date/Time   First MD Initiated Contact with Patient 03/10/23 1726     (approximate)   History   Fall   HPI  Kathryn Morales is a 77 y.o. female  here with fall. History provided by EMS report from facility as pt is severely demented. Per report, she was found down in another person's room, after suspected fall. She seemed to have hit the ground. She was otherwise in her usual state of health and has not been unwell or sick. Duaghter is at bedside and states pt seems to be back to her baseline. No other complaints.       Physical Exam   Triage Vital Signs: ED Triage Vitals  Enc Vitals Group     BP 03/10/23 1714 (!) 157/72     Pulse Rate 03/10/23 1714 64     Resp 03/10/23 1714 16     Temp 03/10/23 1714 99.5 F (37.5 C)     Temp Source 03/10/23 1714 Axillary     SpO2 03/10/23 1714 97 %     Weight 03/10/23 1715 120 lb (54.4 kg)     Height --      Head Circumference --      Peak Flow --      Pain Score 03/10/23 1715 0     Pain Loc --      Pain Edu? --      Excl. in [place]? --     Most recent vital signs: Vitals:   03/10/23 1714  BP: (!) 157/72  Pulse: 64  Resp: 16  Temp: 99.5 F (37.5 C)  SpO2: 97%     General: Awake, no distress.  CV:  Good peripheral perfusion.  Resp:  Normal work of breathing.  Abd:  No distention. No tenderness. Other:  Superficial abrasion to nasal bridge with minimal swelling, no asymmetry. Dried blood b/l nares with widely patent nasopharynx and no septal hematoma. Approx 1 cm deep laceration to right upper lip lateral to central incisor, which does not involve the vermilion border.   ED Results / Procedures / Treatments   Labs (all labs ordered are listed, but only abnormal results are displayed) Labs Reviewed - No data to display   EKG    RADIOLOGY CT Head/C Spine: NEgative CT Face: Minimally displaced lateral nasal fx   I also independently reviewed and agree  with radiologist interpretations.   PROCEDURES:  Critical Care performed: No  ..Laceration Repair  Date/Time: 03/10/2023 8:34 PM  Performed by: Duffy Bruce, MD Authorized by: Duffy Bruce, MD   Consent:    Consent obtained:  Emergent situation   Risks discussed:  Infection, need for additional repair, nerve damage, pain, poor cosmetic result, poor wound healing, retained foreign body, tendon damage and vascular damage   Alternatives discussed:  Referral Universal protocol:    Patient identity confirmed:  Arm band Anesthesia:    Anesthesia method:  Local infiltration   Local anesthetic:  Lidocaine 2% WITH epi Laceration details:    Location:  Lip   Lip location:  Upper interior lip   Length (cm):  1 Pre-procedure details:    Preparation:  Patient was prepped and draped in usual sterile fashion Exploration:    Wound extent: areolar tissue not violated, fascia not violated, no foreign body, no signs of injury, no nerve damage, no tendon damage, no underlying fracture and no vascular damage   Treatment:    Area cleansed with:  Povidone-iodine   Amount of cleaning:  Extensive   Irrigation solution:  Sterile saline   Irrigation method:  Pressure wash Skin repair:    Repair method:  Sutures   Suture size:  5-0   Wound skin closure material used: vicryl.   Suture technique:  Simple interrupted   Number of sutures: 4. Approximation:    Approximation:  Close   Vermilion border well-aligned: yes   Repair type:    Repair type:  Simple Post-procedure details:    Dressing:  Antibiotic ointment   Procedure completion:  Tolerated     MEDICATIONS ORDERED IN ED: Medications  Tdap (BOOSTRIX) injection 0.5 mL (has no administration in time range)  bacitracin ointment (has no administration in time range)  lidocaine-EPINEPHrine (XYLOCAINE W/EPI) 2 %-1:100000 (with pres) injection 20 mL (20 mLs Intradermal Given by Other 03/10/23 1818)     IMPRESSION / MDM / ASSESSMENT AND  PLAN / ED COURSE  I reviewed the triage vital signs and the nursing notes.                              Differential diagnosis includes, but is not limited to, mechanical fall, direct trauma, ICH/SDH, cervical injury, facial fx  Patient's presentation is most consistent with acute presentation with potential threat to life or bodily function.  77 yo F here with likely mechanical fall and facial trauma. She is at her mental baseline per family and had been doing well per facility. No anticoagulant use. Lacerations repaired as above, tolerated well. Dressed with bacitracin. Tetanus is UTD.  D/c with prophylactic abx given oral wound and mechanism (ground of SNF). Dischsarged with family     FINAL CLINICAL IMPRESSION(S) / ED DIAGNOSES   Final diagnoses:  Fall, initial encounter  Laceration of oral cavity, initial encounter     Rx / DC Orders   ED Discharge Orders          Ordered    cephALEXin (KEFLEX) 500 MG capsule  2 times daily        03/10/23 2030    mupirocin ointment (BACTROBAN) 2 %  2 times daily        03/10/23 2032             Note:  This document was prepared using Dragon voice recognition software and may include unintentional dictation errors.   Duffy Bruce, MD 03/10/23 2040

## 2023-08-11 ENCOUNTER — Other Ambulatory Visit: Payer: Self-pay

## 2023-08-11 ENCOUNTER — Emergency Department: Payer: Medicare PPO

## 2023-08-11 ENCOUNTER — Emergency Department
Admission: EM | Admit: 2023-08-11 | Discharge: 2023-08-11 | Disposition: A | Discharge status: Home / Self Care | Payer: Medicare PPO | Attending: Emergency Medicine | Admitting: Emergency Medicine

## 2023-08-11 DIAGNOSIS — S0990XA Unspecified injury of head, initial encounter: Secondary | ICD-10-CM | POA: Diagnosis present

## 2023-08-11 DIAGNOSIS — S0083XA Contusion of other part of head, initial encounter: Secondary | ICD-10-CM | POA: Insufficient documentation

## 2023-08-11 DIAGNOSIS — W19XXXA Unspecified fall, initial encounter: Secondary | ICD-10-CM

## 2023-08-11 DIAGNOSIS — Y92199 Unspecified place in other specified residential institution as the place of occurrence of the external cause: Secondary | ICD-10-CM | POA: Diagnosis not present

## 2023-08-11 DIAGNOSIS — W01198A Fall on same level from slipping, tripping and stumbling with subsequent striking against other object, initial encounter: Secondary | ICD-10-CM | POA: Insufficient documentation

## 2023-08-11 NOTE — ED Provider Notes (Signed)
[facility] Provider Note    Event Date/Time   First MD Initiated Contact with Patient 08/11/23 1312     (approximate)   History   Fall   HPI  Kathryn Morales is a 77 y.o. female with a past medical history of memory impairment living at [facility] memory care unit, hyperlipidemia who presents today for evaluation after mechanical fall.  She is with her son Riki Rusk who reports that she is at her mental baseline.  She did not lose consciousness.  She has not had any vomiting.  She is moving all of her extremities normally.  She is not on blood thinners.  Patient Active Problem List   Diagnosis Date Noted   Arthritis of right hip 06/26/2018   Degenerative disc disease, lumbar 06/26/2018   Impaired memory 11/26/2016   Anxiety and depression 05/07/2016   Carotid artery narrowing 05/07/2016   Hypercholesteremia 02/14/2016   History of colon polyps 06/09/2008   BP (high blood pressure) 02/23/2008   ADD (attention deficit disorder) 06/16/2007   Clinical depression 03/31/2007   HLD (hyperlipidemia) 03/31/2007          Physical Exam   Triage Vital Signs: ED Triage Vitals  Encounter Vitals Group     BP 08/11/23 1253 135/74     Systolic BP Percentile --      Diastolic BP Percentile --      Pulse Rate 08/11/23 1253 (!) 57     Resp 08/11/23 1253 17     Temp 08/11/23 1253 98 F (36.7 C)     Temp Source 08/11/23 1253 Oral     SpO2 08/11/23 1253 98 %     Weight 08/11/23 1254 105 lb (47.6 kg)     Height 08/11/23 1254 5\' 2"  (1.575 m)     Head Circumference --      Peak Flow --      Pain Score 08/11/23 1254 0     Pain Loc --      Pain Education --      Exclude from Growth Chart --     Most recent vital signs: Vitals:   08/11/23 1253  BP: 135/74  Pulse: (!) 57  Resp: 17  Temp: 98 F (36.7 C)  SpO2: 98%    Physical Exam Vitals and nursing note reviewed.  Constitutional:      General: Awake and alert. No acute distress.    Appearance:  Normal appearance. The patient is normal weight.  HENT:     Head: Normocephalic.  1 x 1 cm hematoma to right side of forehead.  No open wounds.  No Battle sign or raccoon eyes.    Mouth: Mucous membranes are moist.  Eyes:     General: PERRL. Normal EOMs        Right eye: No discharge.        Left eye: No discharge.     Conjunctiva/sclera: Conjunctivae normal.  Cardiovascular:     Rate and Rhythm: Normal rate and regular rhythm.     Pulses: Normal pulses.  Pulmonary:     Effort: Pulmonary effort is normal. No respiratory distress.     Breath sounds: Normal breath sounds.  Abdominal:     Abdomen is soft. There is no abdominal tenderness. No rebound or guarding. No distention. Musculoskeletal:        General: No swelling. Normal range of motion.     Cervical back: Normal range of motion and neck supple.  No midline cervical spine  tenderness.  Full range of motion of neck.  Negative Spurling test.  Negative Lhermitte sign.  Normal strength and sensation in bilateral upper extremities. Normal grip strength bilaterally.  Normal intrinsic muscle function of the hand bilaterally.  Normal radial pulses bilaterally. Skin:    General: Skin is warm and dry.     Capillary Refill: Capillary refill takes less than 2 seconds.     Findings: No rash.  Neurological:     Mental Status: The patient is awake and alert.   Neurological: GCS 15 alert and oriented x3 Normal speech, no expressive or receptive aphasia or dysarthria Cranial nerves II through XII intact Normal visual fields 5 out of 5 strength in all 4 extremities with intact sensation throughout No extremity drift Normal finger-to-nose testing, no limb or truncal ataxia   ED Results / Procedures / Treatments   Labs (all labs ordered are listed, but only abnormal results are displayed) Labs Reviewed - No data to display   EKG     RADIOLOGY I independently reviewed and interpreted imaging and agree with radiologists  findings.     PROCEDURES:  Critical Care performed:   Procedures   MEDICATIONS ORDERED IN ED: Medications - No data to display   IMPRESSION / MDM / ASSESSMENT AND PLAN / ED COURSE  I reviewed the triage vital signs and the nursing notes.   Differential diagnosis includes, but is not limited to, contusion, hematoma, intracranial hemorrhage, skull fracture.  Patient is awake and alert, she is at her mental baseline.  Her son is at her bedside.  There are no Battle sign or raccoon eyes.  Patient presents to the emergency department after fall with head strike. No neurological deficits or midline spinal tenderness. Not encephalopathic, at her mental baseline, overall well-appearing. Physical examination and imaging reassuring against urgent or emergent traumatic process. Does not require any other imaging or intervention at this time. Discussed care plan, return precautions, and advised close outpatient follow-up.  Son agrees with plan of care.  Patient was discharged in stable condition.   Patient's presentation is most consistent with acute complicated illness / injury requiring diagnostic workup.     FINAL CLINICAL IMPRESSION(S) / ED DIAGNOSES   Final diagnoses:  Injury of head, initial encounter  Fall, initial encounter     Rx / DC Orders   ED Discharge Orders     None        Note:  This document was prepared using Dragon voice recognition software and may include unintentional dictation errors.   Keturah Shavers 08/11/23 1730    Jene Every, MD 08/12/23 [id number]

## 2023-08-11 NOTE — ED Notes (Signed)
First Nurse Note: Patient to ED via [facility] from [facility] for an unwitnessed fall. Patient has "goose bump" to right side of forehead. Denies any other injuries. Does not take blood thinners. Hx of dementia, acting at baseline.

## 2023-08-11 NOTE — ED Triage Notes (Signed)
Pt was brought in by EMS from [facility] due to a fall. Pt is not on any blood thinners. Pt does have a hematoma to the right front side of her forehead. Pt mental status is at baseline.

## 2023-08-11 NOTE — Discharge Instructions (Signed)
Your CT scans did not reveal any acute injuries.  Please follow-up with your outpatient provider.  Please return for any new, worsening, or change in symptoms or other concerns.  It was a pleasure caring for you today.

## 2024-01-29 ENCOUNTER — Other Ambulatory Visit: Payer: Self-pay

## 2024-01-29 ENCOUNTER — Inpatient Hospital Stay: Payer: Medicare PPO

## 2024-01-29 ENCOUNTER — Emergency Department: Payer: Medicare PPO

## 2024-01-29 ENCOUNTER — Inpatient Hospital Stay
Admission: EM | Admit: 2024-01-29 | Discharge: 2024-02-09 | DRG: 871 | Disposition: A | Discharge status: Nursing Home (Includes ICF) | Payer: Medicare PPO | Source: Skilled Nursing Facility | Attending: Internal Medicine | Admitting: Internal Medicine

## 2024-01-29 DIAGNOSIS — Z66 Do not resuscitate: Secondary | ICD-10-CM | POA: Diagnosis present

## 2024-01-29 DIAGNOSIS — D696 Thrombocytopenia, unspecified: Secondary | ICD-10-CM | POA: Diagnosis present

## 2024-01-29 DIAGNOSIS — R6521 Severe sepsis with septic shock: Secondary | ICD-10-CM | POA: Diagnosis not present

## 2024-01-29 DIAGNOSIS — R6520 Severe sepsis without septic shock: Secondary | ICD-10-CM | POA: Diagnosis not present

## 2024-01-29 DIAGNOSIS — G9340 Encephalopathy, unspecified: Secondary | ICD-10-CM | POA: Diagnosis not present

## 2024-01-29 DIAGNOSIS — Z1152 Encounter for screening for COVID-19: Secondary | ICD-10-CM | POA: Diagnosis not present

## 2024-01-29 DIAGNOSIS — R4182 Altered mental status, unspecified: Principal | ICD-10-CM

## 2024-01-29 DIAGNOSIS — Z818 Family history of other mental and behavioral disorders: Secondary | ICD-10-CM | POA: Diagnosis not present

## 2024-01-29 DIAGNOSIS — D649 Anemia, unspecified: Secondary | ICD-10-CM | POA: Diagnosis present

## 2024-01-29 DIAGNOSIS — A419 Sepsis, unspecified organism: Principal | ICD-10-CM

## 2024-01-29 DIAGNOSIS — J9811 Atelectasis: Secondary | ICD-10-CM

## 2024-01-29 DIAGNOSIS — Z825 Family history of asthma and other chronic lower respiratory diseases: Secondary | ICD-10-CM | POA: Diagnosis not present

## 2024-01-29 DIAGNOSIS — J189 Pneumonia, unspecified organism: Secondary | ICD-10-CM | POA: Diagnosis not present

## 2024-01-29 DIAGNOSIS — E876 Hypokalemia: Secondary | ICD-10-CM | POA: Diagnosis present

## 2024-01-29 DIAGNOSIS — Z803 Family history of malignant neoplasm of breast: Secondary | ICD-10-CM

## 2024-01-29 DIAGNOSIS — G9341 Metabolic encephalopathy: Secondary | ICD-10-CM | POA: Diagnosis present

## 2024-01-29 DIAGNOSIS — Z833 Family history of diabetes mellitus: Secondary | ICD-10-CM | POA: Diagnosis not present

## 2024-01-29 DIAGNOSIS — E7800 Pure hypercholesterolemia, unspecified: Secondary | ICD-10-CM | POA: Diagnosis present

## 2024-01-29 DIAGNOSIS — Z8249 Family history of ischemic heart disease and other diseases of the circulatory system: Secondary | ICD-10-CM

## 2024-01-29 DIAGNOSIS — F0390 Unspecified dementia without behavioral disturbance: Secondary | ICD-10-CM | POA: Diagnosis present

## 2024-01-29 DIAGNOSIS — I9589 Other hypotension: Secondary | ICD-10-CM | POA: Diagnosis not present

## 2024-01-29 DIAGNOSIS — J9601 Acute respiratory failure with hypoxia: Secondary | ICD-10-CM | POA: Diagnosis present

## 2024-01-29 DIAGNOSIS — R4701 Aphasia: Secondary | ICD-10-CM | POA: Diagnosis present

## 2024-01-29 DIAGNOSIS — W44F9XA Other object of natural or organic material, entering into or through a natural orifice, initial encounter: Secondary | ICD-10-CM | POA: Diagnosis present

## 2024-01-29 DIAGNOSIS — I10 Essential (primary) hypertension: Secondary | ICD-10-CM | POA: Diagnosis present

## 2024-01-29 DIAGNOSIS — M6282 Rhabdomyolysis: Secondary | ICD-10-CM | POA: Diagnosis present

## 2024-01-29 DIAGNOSIS — Z8269 Family history of other diseases of the musculoskeletal system and connective tissue: Secondary | ICD-10-CM | POA: Diagnosis not present

## 2024-01-29 DIAGNOSIS — I5A Non-ischemic myocardial injury (non-traumatic): Secondary | ICD-10-CM | POA: Diagnosis present

## 2024-01-29 DIAGNOSIS — I2489 Other forms of acute ischemic heart disease: Secondary | ICD-10-CM | POA: Diagnosis present

## 2024-01-29 DIAGNOSIS — Z79899 Other long term (current) drug therapy: Secondary | ICD-10-CM

## 2024-01-29 DIAGNOSIS — J690 Pneumonitis due to inhalation of food and vomit: Secondary | ICD-10-CM | POA: Diagnosis present

## 2024-01-29 DIAGNOSIS — T17890A Other foreign object in other parts of respiratory tract causing asphyxiation, initial encounter: Secondary | ICD-10-CM | POA: Diagnosis present

## 2024-01-29 DIAGNOSIS — I959 Hypotension, unspecified: Secondary | ICD-10-CM

## 2024-01-29 DIAGNOSIS — R509 Fever, unspecified: Secondary | ICD-10-CM

## 2024-01-29 LAB — COMPREHENSIVE METABOLIC PANEL
ALT: 16 U/L (ref 0–44)
AST: 27 U/L (ref 15–41)
Albumin: 4 g/dL (ref 3.5–5.0)
Alkaline Phosphatase: 45 U/L (ref 38–126)
Anion gap: 10 (ref 5–15)
BUN: 22 mg/dL (ref 8–23)
CO2: 30 mmol/L (ref 22–32)
Calcium: 9.1 mg/dL (ref 8.9–10.3)
Chloride: 100 mmol/L (ref 98–111)
Creatinine, Ser: 1.08 mg/dL — ABNORMAL HIGH (ref 0.44–1.00)
GFR, Estimated: 53 mL/min — ABNORMAL LOW (ref >60)
Glucose, Bld: 115 mg/dL — ABNORMAL HIGH (ref 70–99)
Potassium: 4.2 mmol/L (ref 3.5–5.1)
Sodium: 140 mmol/L (ref 135–145)
Total Bilirubin: 0.9 mg/dL (ref 0.0–1.2)
Total Protein: 7.1 g/dL (ref 6.5–8.1)

## 2024-01-29 LAB — CSF CELL COUNT WITH DIFFERENTIAL
Eosinophils, CSF: 0 %
Lymphs, CSF: 96 %
Monocyte-Macrophage-Spinal Fluid: 0 %
RBC Count, CSF: 0 /mm3 (ref 0–3)
Segmented Neutrophils-CSF: 4 %
Tube #: 3
WBC, CSF: 7 /mm3 — ABNORMAL HIGH (ref 0–5)

## 2024-01-29 LAB — URINALYSIS, W/ REFLEX TO CULTURE (INFECTION SUSPECTED)
Bilirubin Urine: NEGATIVE
Glucose, UA: NEGATIVE mg/dL
Hgb urine dipstick: NEGATIVE
Ketones, ur: NEGATIVE mg/dL
Leukocytes,Ua: NEGATIVE
Nitrite: NEGATIVE
Protein, ur: NEGATIVE mg/dL
Specific Gravity, Urine: 1.02 (ref 1.005–1.030)
pH: 8 (ref 5.0–8.0)

## 2024-01-29 LAB — LACTIC ACID, PLASMA
Lactic Acid, Venous: 1.7 mmol/L (ref 0.5–1.9)
Lactic Acid, Venous: 1.4 mmol/L (ref 0.5–1.9)

## 2024-01-29 LAB — RESP PANEL BY RT-PCR (RSV, FLU A&B, COVID)  RVPGX2
Influenza A by PCR: NEGATIVE
Influenza B by PCR: NEGATIVE
Resp Syncytial Virus by PCR: NEGATIVE
SARS Coronavirus 2 by RT PCR: NEGATIVE

## 2024-01-29 LAB — CBC WITH DIFFERENTIAL/PLATELET
Abs Immature Granulocytes: 0.03 10*3/uL (ref 0.00–0.07)
Basophils Absolute: 0 10*3/uL (ref 0.0–0.1)
Basophils Relative: 0 %
Eosinophils Absolute: 0.1 10*3/uL (ref 0.0–0.5)
Eosinophils Relative: 1 %
HCT: 42.3 % (ref 36.0–46.0)
Hemoglobin: 14 g/dL (ref 12.0–15.0)
Immature Granulocytes: 1 %
Lymphocytes Relative: 16 %
Lymphs Abs: 1 10*3/uL (ref 0.7–4.0)
MCH: 32 pg (ref 26.0–34.0)
MCHC: 33.1 g/dL (ref 30.0–36.0)
MCV: 96.8 fL (ref 80.0–100.0)
Monocytes Absolute: 0.6 10*3/uL (ref 0.1–1.0)
Monocytes Relative: 9 %
Neutro Abs: 4.7 10*3/uL (ref 1.7–7.7)
Neutrophils Relative %: 73 %
Platelets: 161 10*3/uL (ref 150–400)
RBC: 4.37 MIL/uL (ref 3.87–5.11)
RDW: 13.1 % (ref 11.5–15.5)
WBC: 6.5 10*3/uL (ref 4.0–10.5)
nRBC: 0 % (ref 0.0–0.2)

## 2024-01-29 LAB — PROTIME-INR
INR: 1 (ref 0.8–1.2)
INR: 1 (ref 0.8–1.2)
Prothrombin Time: 13.1 s (ref 11.4–15.2)
Prothrombin Time: 13.4 s (ref 11.4–15.2)

## 2024-01-29 LAB — PROTEIN AND GLUCOSE, CSF
Glucose, CSF: 77 mg/dL — ABNORMAL HIGH (ref 40–70)
Total  Protein, CSF: 41 mg/dL (ref 15–45)

## 2024-01-29 LAB — PROCALCITONIN: Procalcitonin: <0.1 ng/mL

## 2024-01-29 LAB — CBG MONITORING, ED: Glucose-Capillary: 107 mg/dL — ABNORMAL HIGH (ref 70–99)

## 2024-01-29 LAB — APTT: aPTT: 27 s (ref 24–36)

## 2024-01-29 MED ORDER — LACTATED RINGERS IV SOLN
150.0000 mL/h | INTRAVENOUS | Status: DC
  Administered 2024-01-30: 150 mL/h via INTRAVENOUS

## 2024-01-29 MED ORDER — SODIUM CHLORIDE 0.9 % IV SOLN
2.0000 g | Freq: Once | INTRAVENOUS | Status: AC
  Administered 2024-01-29: 2 g via INTRAVENOUS
  Filled 2024-01-29: qty 12.5

## 2024-01-29 MED ORDER — METRONIDAZOLE 500 MG/100ML IV SOLN
500.0000 mg | Freq: Two times a day (BID) | INTRAVENOUS | Status: DC
  Administered 2024-01-29: 500 mg via INTRAVENOUS
  Filled 2024-01-29 (×2): qty 100

## 2024-01-29 MED ORDER — ACETAMINOPHEN 10 MG/ML IV SOLN: 1000.0000 mg | Freq: Once | INTRAVENOUS | Status: DC

## 2024-01-29 MED ORDER — VANCOMYCIN HCL 500 MG/100ML IV SOLN
500.0000 mg | Freq: Once | INTRAVENOUS | Status: AC
  Administered 2024-01-29: 500 mg via INTRAVENOUS
  Filled 2024-01-29: qty 100

## 2024-01-29 MED ORDER — VANCOMYCIN HCL IN DEXTROSE 750-5 MG/150ML-% IV SOLN: 750.0000 mg | INTRAVENOUS | Status: DC

## 2024-01-29 MED ORDER — ACETAMINOPHEN 10 MG/ML IV SOLN
1000.0000 mg | Freq: Once | INTRAVENOUS | Status: AC
  Administered 2024-01-30: 1000 mg via INTRAVENOUS
  Filled 2024-01-29: qty 100

## 2024-01-29 MED ORDER — ONDANSETRON HCL 4 MG PO TABS: 4.0000 mg | ORAL_TABLET | Freq: Four times a day (QID) | ORAL | Status: DC | PRN

## 2024-01-29 MED ORDER — ACETAMINOPHEN 10 MG/ML IV SOLN
1000.0000 mg | Freq: Once | INTRAVENOUS | Status: AC
  Administered 2024-01-29: 1000 mg via INTRAVENOUS
  Filled 2024-01-29 (×2): qty 100

## 2024-01-29 MED ORDER — VANCOMYCIN HCL IN DEXTROSE 1-5 GM/200ML-% IV SOLN
1000.0000 mg | Freq: Once | INTRAVENOUS | Status: AC
  Administered 2024-01-29: 1000 mg via INTRAVENOUS
  Filled 2024-01-29: qty 200

## 2024-01-29 MED ORDER — SODIUM CHLORIDE 0.9 % IV SOLN
2.0000 g | Freq: Two times a day (BID) | INTRAVENOUS | Status: DC
  Administered 2024-01-29 – 2024-01-30 (×2): 2 g via INTRAVENOUS
  Filled 2024-01-29 (×2): qty 12.5

## 2024-01-29 MED ORDER — METRONIDAZOLE 500 MG/100ML IV SOLN
500.0000 mg | Freq: Once | INTRAVENOUS | Status: AC
  Administered 2024-01-29: 500 mg via INTRAVENOUS
  Filled 2024-01-29: qty 100

## 2024-01-29 MED ORDER — ENOXAPARIN SODIUM 40 MG/0.4ML IJ SOSY
40.0000 mg | PREFILLED_SYRINGE | INTRAMUSCULAR | Status: DC
  Administered 2024-01-29 – 2024-01-31 (×3): 40 mg via SUBCUTANEOUS
  Filled 2024-01-29 (×3): qty 0.4

## 2024-01-29 MED ORDER — SODIUM CHLORIDE 0.9 % IV BOLUS (SEPSIS)
1000.0000 mL | Freq: Once | INTRAVENOUS | Status: AC
Start: 2024-01-29 — End: 2024-01-29
  Administered 2024-01-29: 1000 mL via INTRAVENOUS

## 2024-01-29 MED ORDER — LACTATED RINGERS IV SOLN: INTRAVENOUS | Status: DC

## 2024-01-29 MED ORDER — ONDANSETRON HCL 4 MG/2ML IJ SOLN: 4.0000 mg | Freq: Four times a day (QID) | INTRAMUSCULAR | Status: DC | PRN

## 2024-01-29 NOTE — ED Notes (Signed)
Patient transported to X-ray

## 2024-01-29 NOTE — Assessment & Plan Note (Addendum)
Acute respiratory failure with hypoxia  SIRS criteria of fever and tachycardia With chest x-ray showing worsening pneumonia antibiotics switched over to Zosyn. Levophed started today so critical care team will take over case.

## 2024-01-29 NOTE — Assessment & Plan Note (Deleted)
Positive generalized lethargy and fatigue in the setting of sepsis CT head within normal limits Baseline dementia confounding issue Unclear general baseline Continue with sepsis treatment as well as IV fluid hydration Monitor

## 2024-01-29 NOTE — H&P (Signed)
History and Physical    Patient: Kathryn Morales:[id number] DOB: [birth year] DOA: 01/29/2024 DOS: the patient was seen and examined on 01/29/2024 PCP: [facility]  Patient coming from: Home  Chief Complaint:  Chief Complaint  Patient presents with   Altered Mental Status   HPI: PARASKEVI FUNEZ is a 78 y.o. female with medical history significant of dementia, hypertension presenting with sepsis, acute respiratory failure with hypoxia and encephalopathy.  Limited history in the setting of encephalopathy.  Patient noted be local nursing home represented.  Per report, patient worsening fatigue and malaise over the past 1 to 2 days.  Patient was found leaning in her wheelchair on the left side not responding normally.  Was noted to be febrile greater then 100 on evaluation.  No reports of nausea or vomiting.  No reports of cough abdominal pain or diarrhea. Presented to the ER Tmax of 103.1, heart rate 90s, BP stable.  Transition to nonrebreather to keep O2 sats greater than 98%.  White count 6.5, hemoglobin 14, platelets 161, lactate 1.4, VBG stable, COVID flu and RSV negative.  Creatinine 1.08.  CT head and chest x-ray grossly stable.  IR guided LP performed.  CSF studies pending. Review of Systems: As mentioned in the history of present illness. All other systems reviewed and are negative. Past Medical History:  Diagnosis Date   Anxiety    Hyperlipidemia    Hypertension    Past Surgical History:  Procedure Laterality Date   BREAST SURGERY     TONSILLECTOMY AND ADENOIDECTOMY     TUBAL LIGATION     Social History:  reports that she has never smoked. She has never used smokeless tobacco. She reports current alcohol use. She reports that she does not use drugs.  No Known Allergies  Family History  Problem Relation Age of Onset   Coronary artery disease Mother    Heart disease Mother    Lupus Sister    Diabetes Brother    Asthma Maternal Grandfather    Breast cancer  Paternal Grandmother    Heart attack Paternal Grandfather    Hypothyroidism Daughter    Anxiety disorder Daughter    Hypothyroidism Maternal Grandmother     Prior to Admission medications   Medication Sig Start Date End Date Taking? Authorizing Provider  ARIPiprazole (ABILIFY) 2 MG tablet Take 1 tablet (2 mg total) by mouth at bedtime. Patient taking differently: Take 2 mg by mouth daily. 06/26/18  Yes Chrismon, Jodell Cipro, PA-C  cholecalciferol (VITAMIN D3) 25 MCG (1000 UNIT) tablet Take 2,000 Units by mouth daily.   Yes [provider]  divalproex (DEPAKOTE SPRINKLE) 125 MG capsule Take 125 mg by mouth 2 (two) times daily. 01/21/24  Yes [provider]  donepezil (ARICEPT) 10 MG tablet Take 1 tablet (10 mg total) by mouth at bedtime. Patient taking differently: Take 10 mg by mouth in the morning. 01/10/17  Yes Chrismon, Jodell Cipro, PA-C  hydrochlorothiazide (MICROZIDE) 12.5 MG capsule Take 12.5 mg by mouth daily. 01/21/24  Yes [provider]  melatonin 5 MG TABS Take 5 mg by mouth at bedtime.   Yes [provider]  sertraline (ZOLOFT) 50 MG tablet Take 75 mg by mouth daily. 01/21/24  Yes [provider]  simvastatin (ZOCOR) 40 MG tablet Take 1 tablet (40 mg total) by mouth at bedtime. 07/22/16  Yes Chrismon, Jodell Cipro, PA-C  mupirocin ointment (BACTROBAN) 2 % Apply 1 Application topically 2 (two) times daily. Patient not taking: Reported on  01/29/2024 03/10/23   Shaune Pollack, MD  nitrofurantoin, macrocrystal-monohydrate, (MACROBID) 100 MG capsule Take 1 capsule (100 mg total) by mouth 2 (two) times daily. Patient not taking: Reported on 01/29/2024 02/01/20   Bosie Clos, MD  predniSONE (STERAPRED UNI-PAK 21 TAB) 10 MG (21) TBPK tablet Taper dose pack as directed on package. Patient not taking: Reported on 06/26/2018 10/17/17   Waverly Ferrari    Physical Exam: Vitals:   01/29/24 1445 01/29/24 1500 01/29/24 1515 01/29/24 1530  BP:  125/67   122/60  Pulse: 92 83 82 75  Resp:  10 15 16   Temp:      TempSrc:      SpO2: 100% 100% 100% 100%  Weight:      Height:       Physical Exam Constitutional:      Appearance: She is normal weight.  HENT:     Head: Normocephalic.     Nose: Nose normal.     Mouth/Throat:     Mouth: Mucous membranes are moist.  Eyes:     Pupils: Pupils are equal, round, and reactive to light.  Cardiovascular:     Rate and Rhythm: Normal rate and regular rhythm.  Pulmonary:     Effort: Pulmonary effort is normal.     Breath sounds: Rales present.  Abdominal:     General: Bowel sounds are normal.  Musculoskeletal:        General: Normal range of motion.     Cervical back: Normal range of motion.  Skin:    General: Skin is warm.  Neurological:     General: No focal deficit present.  Psychiatric:        Mood and Affect: Mood normal.     Data Reviewed:  There are no new results to review at this time.  DG FL GUIDED LUMBAR PUNCTURE CLINICAL DATA:  78 year old female with history of dementia presents with fever and AMS. Lumbar puncture requested.  EXAM: LUMBAR PUNCTURE UNDER FLUOROSCOPY  PROCEDURE: An appropriate skin entry site was determined fluoroscopically. Operator donned sterile gloves and mask. Skin site was marked, then prepped with Betadine, draped in usual sterile fashion, and infiltrated locally with 1% lidocaine. A 20 gauge spinal needle advanced into the thecal sac at L3-L4 from a left interlaminar approach.  Clear colorless CSF spontaneously returned, with opening pressure of 20 cm water. 13 ml CSF were collected and divided among 4 sterile vials for the requested laboratory studies.  The needle was then removed. The patient tolerated the procedure well and there were no complications.  FLUOROSCOPY: Radiation Exposure Index (as provided by the fluoroscopic device): 11.6 mGy Kerma  IMPRESSION: Technically successful lumbar puncture under fluoroscopy.  This exam  was performed by Loyce Dys PA-C, and was supervised and interpreted by Hart Robinsons, MD.  Electronically Signed   By: Hart Robinsons M.D.   On: 01/29/2024 16:07 DG Chest Port 1 View CLINICAL DATA:  Altered mental status.  Concern for sepsis.  EXAM: PORTABLE CHEST 1 VIEW  COMPARISON:  Chest radiograph dated December 21, 2022.  FINDINGS: Low lung volumes. Cardiomediastinal silhouette is otherwise unchanged. Aortic atherosclerosis. Streaky bibasilar opacities. No sizable pleural effusion or pneumothorax. Diffuse osseous demineralization. No acute osseous abnormality.  IMPRESSION: Low lung volumes. Streaky bibasilar opacities favored to represent atelectasis.  Electronically Signed   By: Hart Robinsons M.D.   On: 01/29/2024 11:03 CT Head Wo Contrast CLINICAL DATA:  78 year old female with altered mental status since noon yesterday.  EXAM: CT HEAD WITHOUT CONTRAST  TECHNIQUE: Contiguous axial images were obtained from the base of the skull through the vertex without intravenous contrast.  RADIATION DOSE REDUCTION: This exam was performed according to the departmental dose-optimization program which includes automated exposure control, adjustment of the mA and/or kV according to patient size and/or use of iterative reconstruction technique.  COMPARISON:  Head CT 08/11/2023 and earlier.  FINDINGS: Brain: Stable cerebral volume. No midline shift, ventriculomegaly, mass effect, evidence of mass lesion, intracranial hemorrhage or evidence of cortically based acute infarction.  Chronic generalized appearing cerebral volume loss, moderate patchy bilateral white matter hypodensity.  Vascular: No suspicious intracranial vascular hyperdensity. Calcified atherosclerosis at the skull base.  Skull: Stable, intact.  No acute osseous abnormality identified.  Sinuses/Orbits: Visualized paranasal sinuses and mastoids are stable and well aerated.  Other: No acute  orbit or scalp soft tissue finding.  IMPRESSION: 1. No acute intracranial abnormality. 2. Chronic cerebral volume loss and white matter changes.  Electronically Signed   By: Odessa Fleming M.D.   On: 01/29/2024 10:27  Lab Results  Component Value Date   WBC 6.5 01/29/2024   HGB 14.0 01/29/2024   HCT 42.3 01/29/2024   MCV 96.8 01/29/2024   PLT 161 01/29/2024   Last metabolic panel Lab Results  Component Value Date   GLUCOSE 115 (H) 01/29/2024   NA 140 01/29/2024   K 4.2 01/29/2024   CL 100 01/29/2024   CO2 30 01/29/2024   BUN 22 01/29/2024   CREATININE 1.08 (H) 01/29/2024   GFRNONAA 53 (L) 01/29/2024   CALCIUM 9.1 01/29/2024   PROT 7.1 01/29/2024   ALBUMIN 4.0 01/29/2024   LABGLOB 1.9 01/10/2017   AGRATIO 2.6 (H) 01/10/2017   BILITOT 0.9 01/29/2024   ALKPHOS 45 01/29/2024   AST 27 01/29/2024   ALT 16 01/29/2024   ANIONGAP 10 01/29/2024    Assessment and Plan: * Sepsis (HCC) Acute respiratory failure with hypoxia  Meeting sepsis criteria with Tmax of 103, heart rate 90s WBC 6.5 Lactate 1.7 Covid, flu, RSV negative  Noted progressive hypoxia  CXR w/ ? Bibasilar atelectasis  Fairly broad workup in the ER including LP Follow up CSF studies  UA pending  Kernig and Bradzinski negative on evaluation No significant nuchal rigidity   + rales on exam  Suspect likely pulmonary etiology given overall presentation  Will continue w/ broad spectrum antibiotics including cefepime, flagyl, vancomycin pending further evaluation  CT chest pending  Monitor closely   Encephalopathy Positive generalized lethargy and fatigue in the setting of sepsis CT head within normal limits Baseline dementia confounding issue Unclear general baseline Continue with sepsis treatment as well as IV fluid hydration Monitor      Advance Care Planning:   Code Status: Limited: Do not attempt resuscitation (DNR) -DNR-LIMITED -Do Not Intubate/DNI    Consults: None   Family Communication:  Family at the bedside   Severity of Illness: The appropriate patient status for this patient is INPATIENT. Inpatient status is judged to be reasonable and necessary in order to provide the required intensity of service to ensure the patient's safety. The patient's presenting symptoms, physical exam findings, and initial radiographic and laboratory data in the context of their chronic comorbidities is felt to place them at high risk for further clinical deterioration. Furthermore, it is not anticipated that the patient will be medically stable for discharge from the hospital within 2 midnights of admission.   * I certify that at the point of admission it is  my clinical judgment that the patient will require inpatient hospital care spanning beyond 2 midnights from the point of admission due to high intensity of service, high risk for further deterioration and high frequency of surveillance required.*  Author: Floydene Flock, MD 01/29/2024 5:11 PM  For on call review [email or web address].

## 2024-01-29 NOTE — ED Triage Notes (Signed)
Patient comes in from [facility] via [facility] due to AMS. Pt usually walks at baseline, and has been altered since about noon yesterday. Pt was in a wheel chair and was seen leaning to the left side in her chair, and not responding normally. According to staff, the patient's last know well was 1/29 at noon. Pt has a history of dementia, hyperlipidemia, and depression. Pt currently responsive to painful stimuli only.

## 2024-01-29 NOTE — Progress Notes (Signed)
[facility] is following code sepsis.

## 2024-01-29 NOTE — ED Provider Notes (Signed)
[facility] Provider Note   Event Date/Time   First MD Initiated Contact with Patient 01/29/24 [phone]     (approximate) History  Altered Mental Status  HPI Kathryn Morales is a 78 y.o. female with a past medical history of hypercholesterolemia, anxiety/depression, and dementia who presents from the nursing home via EMS after being found altered with hypoxia in transit.  Patient is usually ambulatory at baseline and speaks with the daughter who is at bedside.  Patient was found leaning to the left side in her chair and not responding normally at around noon yesterday. ROS: Unable to assess   Physical Exam  Triage Vital Signs: ED Triage Vitals  Encounter Vitals Group     BP      Systolic BP Percentile      Diastolic BP Percentile      Pulse      Resp      Temp      Temp src      SpO2      Weight      Height      Head Circumference      Peak Flow      Pain Score      Pain Loc      Pain Education      Exclude from Growth Chart    Most recent vital signs: Vitals:   01/29/24 1515 01/29/24 1530  BP:  122/60  Pulse: 82 75  Resp: 15 16  Temp:    SpO2: 100% 100%   General: Asleep on stretcher with nasal cannula in place CV:  Good peripheral perfusion.  Resp:  Decreased effort.  Clear to auscultation bilaterally Abd:  No distention.  Other:  Elderly well-developed, well-nourished Caucasian female sleeping in bed.  GCS 9 ED Results / Procedures / Treatments  Labs (all labs ordered are listed, but only abnormal results are displayed) Labs Reviewed  BLOOD GAS, VENOUS - Abnormal; Notable for the following components:      Result Value   Bicarbonate 35.1 (*)    Acid-Base Excess 8.0 (*)    All other components within normal limits  COMPREHENSIVE METABOLIC PANEL - Abnormal; Notable for the following components:   Glucose, Bld 115 (*)    Creatinine, Ser 1.08 (*)    GFR, Estimated 53 (*)    All other components within normal limits  PROTEIN AND  GLUCOSE, CSF - Abnormal; Notable for the following components:   Glucose, CSF 77 (*)    All other components within normal limits  RESP PANEL BY RT-PCR (RSV, FLU A&B, COVID)  RVPGX2  CULTURE, BLOOD (ROUTINE X 2)  CULTURE, BLOOD (ROUTINE X 2)  CSF CULTURE W GRAM STAIN  LACTIC ACID, PLASMA  LACTIC ACID, PLASMA  CBC WITH DIFFERENTIAL/PLATELET  PROTIME-INR  PROTIME-INR  APTT  URINALYSIS, W/ REFLEX TO CULTURE (INFECTION SUSPECTED)  VDRL, CSF  CSF CELL COUNT WITH DIFFERENTIAL   EKG ED ECG REPORT I, Merwyn Katos, the attending physician, personally viewed and interpreted this ECG. Date: 01/29/2024 EKG Time: 0953 Rate: 86 Rhythm: normal sinus rhythm QRS Axis: normal Intervals: normal ST/T Wave abnormalities: normal Narrative Interpretation: no evidence of acute ischemia RADIOLOGY ED MD interpretation: One-view portable chest x-ray interpreted by me shows low lung volumes and streaky bibasilar opacities  CT of the head without contrast interpreted by me shows no evidence of acute abnormalities including no intracerebral hemorrhage, obvious masses, or significant edema -Agree with radiology assessment Official radiology report(s): DG Chest [facility]  1 View Result Date: 01/29/2024 CLINICAL DATA:  Altered mental status.  Concern for sepsis. EXAM: PORTABLE CHEST 1 VIEW COMPARISON:  Chest radiograph dated December 21, 2022. FINDINGS: Low lung volumes. Cardiomediastinal silhouette is otherwise unchanged. Aortic atherosclerosis. Streaky bibasilar opacities. No sizable pleural effusion or pneumothorax. Diffuse osseous demineralization. No acute osseous abnormality. IMPRESSION: Low lung volumes. Streaky bibasilar opacities favored to represent atelectasis. Electronically Signed   By: Hart Robinsons M.D.   On: 01/29/2024 11:03   CT Head Wo Contrast Result Date: 01/29/2024 CLINICAL DATA:  78 year old female with altered mental status since noon yesterday. EXAM: CT HEAD WITHOUT CONTRAST TECHNIQUE:  Contiguous axial images were obtained from the base of the skull through the vertex without intravenous contrast. RADIATION DOSE REDUCTION: This exam was performed according to the departmental dose-optimization program which includes automated exposure control, adjustment of the mA and/or kV according to patient size and/or use of iterative reconstruction technique. COMPARISON:  Head CT 08/11/2023 and earlier. FINDINGS: Brain: Stable cerebral volume. No midline shift, ventriculomegaly, mass effect, evidence of mass lesion, intracranial hemorrhage or evidence of cortically based acute infarction. Chronic generalized appearing cerebral volume loss, moderate patchy bilateral white matter hypodensity. Vascular: No suspicious intracranial vascular hyperdensity. Calcified atherosclerosis at the skull base. Skull: Stable, intact.  No acute osseous abnormality identified. Sinuses/Orbits: Visualized paranasal sinuses and mastoids are stable and well aerated. Other: No acute orbit or scalp soft tissue finding. IMPRESSION: 1. No acute intracranial abnormality. 2. Chronic cerebral volume loss and white matter changes. Electronically Signed   By: Odessa Fleming M.D.   On: 01/29/2024 10:27   PROCEDURES: Critical Care performed: Yes, see critical care procedure note(s) .1-3 Lead EKG Interpretation  Performed by: Merwyn Katos, MD Authorized by: Merwyn Katos, MD     Interpretation: normal     ECG rate:  71   ECG rate assessment: normal     Rhythm: sinus rhythm     Ectopy: none     Conduction: normal   CRITICAL CARE Performed by: Merwyn Katos  Total critical care time: 55 minutes  Critical care time was exclusive of separately billable procedures and treating other patients.  Critical care was necessary to treat or prevent imminent or life-threatening deterioration.  Critical care was time spent personally by me on the following activities: development of treatment plan with patient and/or surrogate as well  as nursing, discussions with consultants, evaluation of patient's response to treatment, examination of patient, obtaining history from patient or surrogate, ordering and performing treatments and interventions, ordering and review of laboratory studies, ordering and review of radiographic studies, pulse oximetry and re-evaluation of patient's condition.  MEDICATIONS ORDERED IN ED: Medications  lactated ringers infusion ( Intravenous New Bag/Given 01/29/24 1207)  sodium chloride 0.9 % bolus 1,000 mL (0 mLs Intravenous Stopped 01/29/24 1249)  ceFEPIme (MAXIPIME) 2 g in sodium chloride 0.9 % 100 mL IVPB (0 g Intravenous Stopped 01/29/24 1059)  metroNIDAZOLE (FLAGYL) IVPB 500 mg (0 mg Intravenous Stopped 01/29/24 1207)  vancomycin (VANCOCIN) IVPB 1000 mg/200 mL premix (0 mg Intravenous Stopped 01/29/24 1356)  acetaminophen (OFIRMEV) IV 1,000 mg (0 mg Intravenous Stopped 01/29/24 1509)   IMPRESSION / MDM / ASSESSMENT AND PLAN / ED COURSE  I reviewed the triage vital signs and the nursing notes.                             The patient is on the cardiac monitor to evaluate for  evidence of arrhythmia and/or significant heart rate changes. Patient's presentation is most consistent with acute presentation with potential threat to life or bodily function. The Pt presents with altered mental status and hypoxia highly concerning for sepsis (suspected urinary versus pulmonary versus GI versus neuro source). At this time, the Pt is satting well on 10 L, normotensive, and appears HDS.  Will start empiric antibiotics and fluids.  Due to fever and sepsis criteria, will administer fluids gradually with frequent reassessment. Have low suspicion for a GI, skin/soft tissue, or CNS source at this time, but will reconsider if initial workup is unremarkable.  - CBC, BMP, LFTs - VBG - UA - BCx x2, Lactate - EKG - CXR - CT head - Empiric Abx: Cefepime, Flagyl, vancomycin - Fluids: 30cc/kg NS --Given fever and altered  mental status of unknown origin, patient received LP  Dispo: Admit to medicine   FINAL CLINICAL IMPRESSION(S) / ED DIAGNOSES   Final diagnoses:  Altered mental status, unspecified altered mental status type  Sepsis, due to unspecified organism, unspecified whether acute organ dysfunction present (HCC)  Acute respiratory failure with hypoxia (HCC)   Rx / DC Orders   ED Discharge Orders     None      Note:  This document was prepared using Dragon voice recognition software and may include unintentional dictation errors.   Merwyn Katos, MD 01/29/24 [phone]

## 2024-01-29 NOTE — Procedures (Signed)
PROCEDURE SUMMARY:  Successful fluoro guided LP at L3-L4. Opening pressure 20 cm H2O  Yielded 13 mL of clear, colorless fluid.  No immediate complications.  Pt tolerated well.   Specimen was sent for labs.  EBL < 5mL  Hoyt Koch PA-C 01/29/2024 2:45 PM

## 2024-01-29 NOTE — Consult Note (Signed)
CODE SEPSIS - PHARMACY COMMUNICATION  **Broad-spectrum antimicrobials should be administered within one hour of sepsis diagnosis**  Time Code Sepsis call or page was received: 0959  Antibiotics ordered: Cefepime, Vancomycin, Flagyl  Time of first antibiotic administration: 1024  Additional action taken by pharmacy: N/A  If necessary, name of provider/nurse contacted: N/A    Will M. Dareen Piano, PharmD Clinical Pharmacist 01/29/2024 10:05 AM

## 2024-01-29 NOTE — ED Notes (Signed)
Pt bed changed due to pure wick malfunction and rectal temp checked because pt was shaking. Temp was 100.9. Night NP made aware.

## 2024-01-29 NOTE — Consult Note (Signed)
Pharmacy Antibiotic Note  Kathryn Morales is a 78 y.o. female admitted on 01/29/2024 with sepsis. Patient presented with altered mental status and was seen leaning to the left side in her wheelchair, not responding normally. PMH significant for dementia, hyperlipidemia, and depression. Pharmacy has been consulted for vancomycin and cefepime dosing.  Today, 01/29/2024 Day 1 of cefepime and vancomycin  WBC 6.5 Febrile, Tmax 103.1 SCr 1.08 today with estimated CrCl 37.5 mL/min  1/30 CXR: Streaky bibasilar opacities favored to represent atelectasis Blood cultures and CSF cultures pending  Has received one time doses of cefepime 2 gm, vancomycin 1 gm, and metronidazole 500 mg   Plan: Give an additional vancomycin 500 mg IV x 1 to complete full loading dose of 1500 mg, followed by vancomycin 750 mg IV Q24H  Goal AUC 450 - 550  Estimated AUC 515.2 Estimated Cmin 14.5  SCr used 1.08, IBW, Vd 0.72  Start cefepime 2 gm IV Q12H based on current renal function  Also receiving metronidazole 500 mg IV Q12H Pharmacy will continue to monitor and dose adjust appropriately  Height: 5\' 2"  (157.5 cm) Weight: 61.2 kg (135 lb) IBW/kg (Calculated) : 50.1  Temp (24hrs), Avg:102.5 F (39.2 C), Min:101.8 F (38.8 C), Max:103.1 F (39.5 C)  Recent Labs  Lab 01/29/24 0946 01/29/24 0959 01/29/24 1004 01/29/24 1212  WBC  --   --  6.5  --   CREATININE 1.08*  --   --   --   LATICACIDVEN  --  1.7  --  1.4    Estimated Creatinine Clearance: 37.5 mL/min (A) (by C-G formula based on SCr of 1.08 mg/dL (H)).    No Known Allergies  Antimicrobials this admission: 1/30 metronidazole >> 1/30 cefepime >>  1/30 vancomycin >>   Dose adjustments this admission:  Microbiology results: 1/30 BCx: sent 1/30 Resp panel: negative 1/30 CSF culture: pending   Thank you for allowing pharmacy to be a part of this patient's care.  Littie Deeds, PharmD Pharmacy Resident  01/29/2024 4:46 PM

## 2024-01-30 DIAGNOSIS — R6520 Severe sepsis without septic shock: Secondary | ICD-10-CM

## 2024-01-30 DIAGNOSIS — I959 Hypotension, unspecified: Secondary | ICD-10-CM

## 2024-01-30 DIAGNOSIS — J9601 Acute respiratory failure with hypoxia: Secondary | ICD-10-CM | POA: Diagnosis not present

## 2024-01-30 DIAGNOSIS — I9589 Other hypotension: Secondary | ICD-10-CM | POA: Diagnosis not present

## 2024-01-30 DIAGNOSIS — A419 Sepsis, unspecified organism: Secondary | ICD-10-CM | POA: Diagnosis not present

## 2024-01-30 DIAGNOSIS — J9811 Atelectasis: Secondary | ICD-10-CM

## 2024-01-30 DIAGNOSIS — F0390 Unspecified dementia without behavioral disturbance: Secondary | ICD-10-CM

## 2024-01-30 DIAGNOSIS — G9341 Metabolic encephalopathy: Secondary | ICD-10-CM

## 2024-01-30 LAB — MENINGITIS/ENCEPHALITIS PANEL (CSF)

## 2024-01-30 LAB — BLOOD GAS, VENOUS
Acid-Base Excess: 8 mmol/L — ABNORMAL HIGH (ref 0.0–2.0)
Bicarbonate: 35.1 mmol/L — ABNORMAL HIGH (ref 20.0–28.0)
O2 Saturation: 36.4 %
Patient temperature: 37
pCO2, Ven: 58 mm[Hg] (ref 44–60)
pH, Ven: 7.39 (ref 7.25–7.43)

## 2024-01-30 LAB — COMPREHENSIVE METABOLIC PANEL
ALT: 21 U/L (ref 0–44)
AST: 52 U/L — ABNORMAL HIGH (ref 15–41)
Albumin: 3.4 g/dL — ABNORMAL LOW (ref 3.5–5.0)
Alkaline Phosphatase: 38 U/L (ref 38–126)
Anion gap: 9 (ref 5–15)
BUN: 13 mg/dL (ref 8–23)
CO2: 28 mmol/L (ref 22–32)
Calcium: 8.5 mg/dL — ABNORMAL LOW (ref 8.9–10.3)
Chloride: 99 mmol/L (ref 98–111)
Creatinine, Ser: 0.79 mg/dL (ref 0.44–1.00)
GFR, Estimated: >60 mL/min (ref >60)
Glucose, Bld: 109 mg/dL — ABNORMAL HIGH (ref 70–99)
Potassium: 3.5 mmol/L (ref 3.5–5.1)
Sodium: 136 mmol/L (ref 135–145)
Total Bilirubin: 0.8 mg/dL (ref 0.0–1.2)
Total Protein: 6.3 g/dL — ABNORMAL LOW (ref 6.5–8.1)

## 2024-01-30 LAB — CBC
HCT: 38.5 % (ref 36.0–46.0)
Hemoglobin: 12.8 g/dL (ref 12.0–15.0)
MCH: 32.4 pg (ref 26.0–34.0)
MCHC: 33.2 g/dL (ref 30.0–36.0)
MCV: 97.5 fL (ref 80.0–100.0)
Platelets: 124 10*3/uL — ABNORMAL LOW (ref 150–400)
RBC: 3.95 MIL/uL (ref 3.87–5.11)
RDW: 12.7 % (ref 11.5–15.5)
WBC: 8.5 10*3/uL (ref 4.0–10.5)
nRBC: 0 % (ref 0.0–0.2)

## 2024-01-30 LAB — VALPROIC ACID LEVEL: Valproic Acid Lvl: 25 ug/mL — ABNORMAL LOW (ref 50.0–100.0)

## 2024-01-30 LAB — GLUCOSE, CAPILLARY: Glucose-Capillary: 113 mg/dL — ABNORMAL HIGH (ref 70–99)

## 2024-01-30 LAB — VDRL, CSF: VDRL Quant, CSF: NONREACTIVE

## 2024-01-30 MED ORDER — DIVALPROEX SODIUM 125 MG PO CSDR
125.0000 mg | DELAYED_RELEASE_CAPSULE | Freq: Two times a day (BID) | ORAL | Status: DC
  Filled 2024-01-30 (×2): qty 1

## 2024-01-30 MED ORDER — SIMVASTATIN 20 MG PO TABS: 40.0000 mg | ORAL_TABLET | Freq: Every day | ORAL | Status: DC

## 2024-01-30 MED ORDER — AZITHROMYCIN 250 MG PO TABS: 250.0000 mg | ORAL_TABLET | Freq: Every day | ORAL | Status: DC

## 2024-01-30 MED ORDER — AZITHROMYCIN 250 MG PO TABS
500.0000 mg | ORAL_TABLET | Freq: Every day | ORAL | Status: DC
Start: 2024-01-30 — End: 2024-01-30
  Filled 2024-01-30: qty 2

## 2024-01-30 MED ORDER — SODIUM CHLORIDE 0.9 % IV SOLN
3.0000 g | Freq: Four times a day (QID) | INTRAVENOUS | Status: DC
  Administered 2024-01-30 – 2024-02-01 (×6): 3 g via INTRAVENOUS
  Filled 2024-01-30 (×8): qty 8

## 2024-01-30 MED ORDER — ARIPIPRAZOLE 2 MG PO TABS
2.0000 mg | ORAL_TABLET | Freq: Every day | ORAL | Status: DC
  Filled 2024-01-30: qty 1

## 2024-01-30 MED ORDER — IPRATROPIUM-ALBUTEROL 0.5-2.5 (3) MG/3ML IN SOLN
3.0000 mL | Freq: Four times a day (QID) | RESPIRATORY_TRACT | Status: DC
  Administered 2024-01-30 – 2024-01-31 (×5): 3 mL via RESPIRATORY_TRACT
  Filled 2024-01-30 (×5): qty 3

## 2024-01-30 MED ORDER — MELATONIN 5 MG PO TABS: 5.0000 mg | ORAL_TABLET | Freq: Every day | ORAL | Status: DC

## 2024-01-30 MED ORDER — ACETAMINOPHEN 10 MG/ML IV SOLN
1000.0000 mg | Freq: Once | INTRAVENOUS | Status: AC
Start: 2024-01-30 — End: 2024-01-30
  Administered 2024-01-30: 1000 mg via INTRAVENOUS
  Filled 2024-01-30: qty 100

## 2024-01-30 MED ORDER — SODIUM CHLORIDE 0.9 % IV SOLN
500.0000 mg | INTRAVENOUS | Status: DC
  Administered 2024-01-30 – 2024-01-31 (×2): 500 mg via INTRAVENOUS
  Filled 2024-01-30 (×2): qty 5

## 2024-01-30 MED ORDER — LACTATED RINGERS IV SOLN: INTRAVENOUS | Status: AC

## 2024-01-30 MED ORDER — VANCOMYCIN HCL IN DEXTROSE 1-5 GM/200ML-% IV SOLN: 1000.0000 mg | INTRAVENOUS | Status: DC

## 2024-01-30 MED ORDER — DONEPEZIL HCL 5 MG PO TABS
10.0000 mg | ORAL_TABLET | Freq: Every morning | ORAL | Status: DC
  Filled 2024-01-30: qty 2

## 2024-01-30 MED ORDER — SERTRALINE HCL 50 MG PO TABS
75.0000 mg | ORAL_TABLET | Freq: Every day | ORAL | Status: DC
  Filled 2024-01-30: qty 2

## 2024-01-30 NOTE — Progress Notes (Signed)
Progress Note   Patient: Kathryn Morales:[id number] DOB: 18-Sep-1946 DOA: 01/29/2024     1 DOS: the patient was seen and examined on 01/30/2024   Brief hospital course: 78 year old female past medical history of dementia, hypertension.  She presents to the hospital with fever, hypoxia and acute metabolic encephalopathy.  She has worsening fatigue and malaise over the past 1 to 2 days found to be leaning in her wheelchair to the left side not responding normally.  Patient had a fever of 103 in the emergency room.  Lumbar puncture was negative.  Patient started empirically on antibiotics.  CT scan of the chest shows atelectasis and occlusion of the left lower lobe.  1/31.  Antibiotics changed order Unasyn and Zithromax.  Patient not alert enough this morning to be placed on a diet.  Assessment and Plan: * Severe sepsis (HCC) Acute respiratory failure with hypoxia  SIRS criteria of fever and tachycardia Antibiotics changed over to Unasyn and Zithromax since patient has mucous plugging left lower lobe.  Will treat like pneumonia.   Acute respiratory failure with hypoxia (HCC) Patient was on high flow nasal cannula 10 L this morning.  Looks like they were able to taper over to 3 L this afternoon.  Acute metabolic encephalopathy Patient has underlying dementia and fever.  Patient's mental status not awake enough in order to eat currently.  Atelectasis Nebulizer treatments  Hypotension Holding antihypertensive medication  Dementia without behavioral disturbance (HCC) Currently unable to give medications        Subjective: Patient with a history of dementia but normally able to walk and eat well.  Coming in with altered mental status and fever.  Physical Exam: Vitals:   01/30/24 1200 01/30/24 1300 01/30/24 1600 01/30/24 1601  BP: (!) 90/54 (!) 111/58 (!) 97/46   Pulse: 72 81 86 85  Resp: 20 19 18    Temp:  (!) 101.9 F (38.8 C) (!) 101 F (38.3 C)   TempSrc:  Rectal     SpO2: 98% 100% 90% 95%  Weight:      Height:       Physical Exam HENT:     Head: Normocephalic.  Eyes:     General: Lids are normal.     Conjunctiva/sclera: Conjunctivae normal.  Cardiovascular:     Rate and Rhythm: Normal rate and regular rhythm.     Heart sounds: Normal heart sounds, S1 normal and S2 normal.  Pulmonary:     Breath sounds: Examination of the right-lower field reveals decreased breath sounds. Examination of the left-lower field reveals decreased breath sounds. Decreased breath sounds present. No wheezing, rhonchi or rales.  Abdominal:     Palpations: Abdomen is soft.     Tenderness: There is no abdominal tenderness.  Musculoskeletal:     Right lower leg: Swelling present.     Left lower leg: Swelling present.  Skin:    General: Skin is warm.     Findings: No rash.  Neurological:     Mental Status: She is lethargic.     Comments: Patient able to hold her arms up off the bed for a little bit but then slowly lowered sleeping.  Patient able to wiggle her toes.     Data Reviewed: Creatinine 0.79, potassium 3.5, sodium 126, procalcitonin negative, lactic acid 1.4, hemoglobin 12.8, platelet count 124, white blood cell count 8.5 CT scan of the head showed no acute intracranial abnormality CT scan of the chest showed complete atelectasis or collapse of the left lower  lobe.  Left lower lobe bronchus completely occluded findings favor mucous plugging and postobstructive atelectasis Lumbar puncture panel negative, COVID RSV and influenza negative.  LP culture negative.  Family Communication: Spoke with daughter at the bedside  Disposition: Status is: Inpatient Remains inpatient appropriate because: Patient on heated high flow nasal cannula this morning.  Patient with altered mental status and fever.  On empiric antibiotics.  Not alert enough this morning to eat.  Planned Discharge Destination: To be determined    Time spent: 30 minutes  Author: Alford Highland,  MD 01/30/2024 4:44 PM  For on call review [email or web address].

## 2024-01-30 NOTE — Assessment & Plan Note (Signed)
Now with septic shock.  Patient started on Levophed.

## 2024-01-30 NOTE — Consult Note (Signed)
Pharmacy Antibiotic Note  Kathryn Morales is a 78 y.o. female admitted on 01/29/2024 with sepsis. Patient presented with altered mental status and was seen leaning to the left side in her wheelchair, not responding normally. PMH significant for dementia, hyperlipidemia, and depression. Pharmacy has been consulted for vancomycin and cefepime dosing.  Today, 01/30/2024 Day 2 of cefepime and vancomycin  WBC 6.5 > 8.5 Febrile, Tmax 103.1 SCr 1.08 > 0.79 1/30 CXR: Streaky bibasilar opacities favored to represent atelectasis Blood cultures and CSF cultures- negative  Plan: Due to improvement in renal function, will change vancomycin to 1000 mg IV Q24H  Goal AUC 450 - 550  Estimated AUC 527 Estimated Cmin 13.1 SCr used 0.8, IBW, Vd 0.72  Continue cefepime 2 gm IV Q12H  Also receiving metronidazole 500 mg IV Q12H Pharmacy will continue to monitor and dose adjust appropriately  Height: 5\' 2"  (157.5 cm) Weight: 61.2 kg (135 lb) IBW/kg (Calculated) : 50.1  Temp (24hrs), Avg:101 F (38.3 C), Min:98.9 F (37.2 C), Max:103.1 F (39.5 C)  Recent Labs  Lab 01/29/24 0946 01/29/24 0959 01/29/24 1004 01/29/24 1212 01/30/24 0437  WBC  --   --  6.5  --  8.5  CREATININE 1.08*  --   --   --  0.79  LATICACIDVEN  --  1.7  --  1.4  --     Estimated Creatinine Clearance: 50.7 mL/min (by C-G formula based on SCr of 0.79 mg/dL).    No Known Allergies  Antimicrobials this admission: 1/30 metronidazole >> 1/30 cefepime >>  1/30 vancomycin >>   Dose adjustments this admission:  Microbiology results: 1/30 BCx: sent 1/30 Resp panel: negative 1/30 CSF culture: negative  Thank you for allowing pharmacy to be a part of this patient's care.  Lavonda Thal Rodriguez-Guzman PharmD, BCPS 01/30/2024 9:34 AM

## 2024-01-30 NOTE — Assessment & Plan Note (Signed)
Patient with worsening respiratory status overnight and this morning was on 100% oxygen with BiPAP.  1 dose of Lasix given just in case fluid overload with all the fluid boluses yesterday.

## 2024-01-30 NOTE — Hospital Course (Signed)
78 year old female past medical history of dementia, hypertension.  She presents to the hospital with fever, hypoxia and acute metabolic encephalopathy.  She has worsening fatigue and malaise over the past 1 to 2 days found to be leaning in her wheelchair to the left side not responding normally.  Patient had a fever of 103 in the emergency room.  Lumbar puncture was negative.  Patient started empirically on antibiotics.  CT scan of the chest shows atelectasis and occlusion of the left lower lobe.  1/31.  Antibiotics changed order Unasyn and Zithromax.  Patient not alert enough this morning to be placed on a diet. 2/1.  Patient's mental status better than yesterday.  Able to be placed back on a diet.  Patient does have a history of dementia.  Will restart oral medications.

## 2024-01-30 NOTE — Progress Notes (Addendum)
       CROSS COVER NOTE  NAME: Kathryn Morales MRN: [id number] DOB : Nov 19, 1946 ATTENDING PHYSICIAN: Floydene Flock, MD    Date of Service   01/30/2024   HPI/Events of Note   Notified by admitting MD regarding CT results LLL collapse from mucous plugging.   Interventions   Assessment/Plan: Discussed with ICU NP, images reviewed Sats and hemodynamics stable with 8L HFNC Pulm consult in am Bed change to progressive Maximize good lung down with position changes Chest physiotherpy Temp spike102.3, responded to cooling blanket        Donnie Mesa NP [facility] Cross Cover 7pm-7am - check amion for availability Pager [phone]

## 2024-01-30 NOTE — Progress Notes (Signed)
   01/30/24 1030  Spiritual Encounters  Type of Visit Initial  Care provided to: Pt and family (Daughter at bedside)  Conversation partners present during Programmer, systems  Referral source Chaplain assessment  Reason for visit Routine spiritual support  OnCall Visit No  Spiritual Framework  Presenting Themes Other (comment) (Pt was resting; Daughter at bedside)  Family Stress Factors Other (Comment) (Compassionate presence; empathetic listening with daughter)  Interventions  Spiritual Care Interventions Made Established relationship of care and support;Compassionate presence;Reflective listening;Encouragement;Other (comment) (For family)  Intervention Outcomes  Outcomes Connection to spiritual care;Other (comment);Awareness of support (for family)

## 2024-01-30 NOTE — Assessment & Plan Note (Signed)
Nebulizer treatments.  Critical care team to talk to family about next steps.

## 2024-01-30 NOTE — Assessment & Plan Note (Signed)
Patient has underlying dementia and fever.  Patient's mental status not awake enough in order to eat currently.

## 2024-01-30 NOTE — Plan of Care (Signed)
  Problem: Fluid Volume: Goal: Hemodynamic stability will improve Outcome: Progressing   

## 2024-01-30 NOTE — Assessment & Plan Note (Signed)
Restart her usual medications

## 2024-01-31 DIAGNOSIS — I9589 Other hypotension: Secondary | ICD-10-CM | POA: Diagnosis not present

## 2024-01-31 DIAGNOSIS — G9341 Metabolic encephalopathy: Secondary | ICD-10-CM | POA: Diagnosis not present

## 2024-01-31 DIAGNOSIS — A419 Sepsis, unspecified organism: Secondary | ICD-10-CM | POA: Diagnosis not present

## 2024-01-31 DIAGNOSIS — J9601 Acute respiratory failure with hypoxia: Secondary | ICD-10-CM | POA: Diagnosis not present

## 2024-01-31 LAB — BASIC METABOLIC PANEL
Anion gap: 10 (ref 5–15)
BUN: 15 mg/dL (ref 8–23)
CO2: 30 mmol/L (ref 22–32)
Calcium: 8.3 mg/dL — ABNORMAL LOW (ref 8.9–10.3)
Chloride: 100 mmol/L (ref 98–111)
Creatinine, Ser: 0.79 mg/dL (ref 0.44–1.00)
GFR, Estimated: >60 mL/min (ref >60)
Glucose, Bld: 101 mg/dL — ABNORMAL HIGH (ref 70–99)
Potassium: 3.9 mmol/L (ref 3.5–5.1)
Sodium: 140 mmol/L (ref 135–145)

## 2024-01-31 LAB — CBC
HCT: 33.2 % — ABNORMAL LOW (ref 36.0–46.0)
Hemoglobin: 11.3 g/dL — ABNORMAL LOW (ref 12.0–15.0)
MCH: 32.4 pg (ref 26.0–34.0)
MCHC: 34 g/dL (ref 30.0–36.0)
MCV: 95.1 fL (ref 80.0–100.0)
Platelets: 113 10*3/uL — ABNORMAL LOW (ref 150–400)
RBC: 3.49 MIL/uL — ABNORMAL LOW (ref 3.87–5.11)
RDW: 13 % (ref 11.5–15.5)
WBC: 6.5 10*3/uL (ref 4.0–10.5)
nRBC: 0 % (ref 0.0–0.2)

## 2024-01-31 LAB — CK: Total CK: 1709 U/L — ABNORMAL HIGH (ref 38–234)

## 2024-01-31 LAB — PROCALCITONIN: Procalcitonin: 2.03 ng/mL

## 2024-01-31 MED ORDER — LACTATED RINGERS IV SOLN: INTRAVENOUS | Status: DC

## 2024-01-31 MED ORDER — LACTATED RINGERS IV BOLUS
250.0000 mL | Freq: Once | INTRAVENOUS | Status: AC
  Administered 2024-01-31: 250 mL via INTRAVENOUS

## 2024-01-31 MED ORDER — MELATONIN 5 MG PO TABS
5.0000 mg | ORAL_TABLET | Freq: Every day | ORAL | Status: DC
Start: 2024-01-31 — End: 2024-02-09
  Administered 2024-01-31 – 2024-02-08 (×8): 5 mg via ORAL
  Filled 2024-01-31 (×8): qty 1

## 2024-01-31 MED ORDER — MIDODRINE HCL 5 MG PO TABS
10.0000 mg | ORAL_TABLET | Freq: Three times a day (TID) | ORAL | Status: DC
  Administered 2024-01-31: 10 mg via ORAL
  Filled 2024-01-31: qty 2

## 2024-01-31 MED ORDER — IPRATROPIUM-ALBUTEROL 0.5-2.5 (3) MG/3ML IN SOLN
3.0000 mL | Freq: Two times a day (BID) | RESPIRATORY_TRACT | Status: DC
  Administered 2024-01-31 – 2024-02-05 (×10): 3 mL via RESPIRATORY_TRACT
  Filled 2024-01-31 (×10): qty 3

## 2024-01-31 MED ORDER — SERTRALINE HCL 50 MG PO TABS
75.0000 mg | ORAL_TABLET | Freq: Every day | ORAL | Status: DC
  Administered 2024-01-31: 75 mg via ORAL
  Filled 2024-01-31: qty 2

## 2024-01-31 MED ORDER — DIVALPROEX SODIUM 125 MG PO CSDR
125.0000 mg | DELAYED_RELEASE_CAPSULE | Freq: Two times a day (BID) | ORAL | Status: DC
  Administered 2024-01-31: 125 mg via ORAL
  Filled 2024-01-31 (×2): qty 1

## 2024-01-31 MED ORDER — DOXYCYCLINE HYCLATE 100 MG PO TABS: 100.0000 mg | ORAL_TABLET | Freq: Two times a day (BID) | ORAL | Status: DC

## 2024-01-31 MED ORDER — DONEPEZIL HCL 5 MG PO TABS
10.0000 mg | ORAL_TABLET | Freq: Every morning | ORAL | Status: DC
  Administered 2024-02-03 – 2024-02-09 (×6): 10 mg via ORAL
  Filled 2024-01-31 (×9): qty 2

## 2024-01-31 MED ORDER — MIDODRINE HCL 5 MG PO TABS
5.0000 mg | ORAL_TABLET | Freq: Three times a day (TID) | ORAL | Status: DC
  Administered 2024-01-31: 5 mg via ORAL
  Filled 2024-01-31: qty 1

## 2024-01-31 MED ORDER — IBUPROFEN 400 MG PO TABS: 400.0000 mg | ORAL_TABLET | Freq: Four times a day (QID) | ORAL | Status: DC | PRN

## 2024-01-31 MED ORDER — ACETAMINOPHEN 325 MG PO TABS
650.0000 mg | ORAL_TABLET | Freq: Four times a day (QID) | ORAL | Status: DC | PRN
  Administered 2024-01-31 – 2024-02-07 (×3): 650 mg via ORAL
  Filled 2024-01-31 (×3): qty 2

## 2024-01-31 MED ORDER — ARIPIPRAZOLE 2 MG PO TABS
2.0000 mg | ORAL_TABLET | Freq: Every day | ORAL | Status: DC
  Administered 2024-01-31: 2 mg via ORAL
  Filled 2024-01-31 (×2): qty 1

## 2024-01-31 MED ORDER — ACETAMINOPHEN 10 MG/ML IV SOLN
1000.0000 mg | Freq: Once | INTRAVENOUS | Status: AC
  Administered 2024-01-31: 1000 mg via INTRAVENOUS
  Filled 2024-01-31: qty 100

## 2024-01-31 NOTE — Plan of Care (Signed)
  Problem: Fluid Volume: Goal: Hemodynamic stability will improve Outcome: Progressing   Problem: Clinical Measurements: Goal: Diagnostic test results will improve Outcome: Progressing Goal: Signs and symptoms of infection will decrease Outcome: Progressing   Problem: Respiratory: Goal: Ability to maintain adequate ventilation will improve Outcome: Progressing   Problem: Clinical Measurements: Goal: Diagnostic test results will improve Outcome: Progressing Goal: Respiratory complications will improve Outcome: Progressing   Problem: Nutrition: Goal: Adequate nutrition will be maintained Outcome: Progressing   Problem: Coping: Goal: Level of anxiety will decrease Outcome: Progressing

## 2024-01-31 NOTE — Progress Notes (Signed)
Progress Note   Patient: Kathryn Morales:[id number] DOB: Mar 02, 1946 DOA: 01/29/2024     2 DOS: the patient was seen and examined on 01/31/2024   Brief hospital course: 78 year old female past medical history of dementia, hypertension.  She presents to the hospital with fever, hypoxia and acute metabolic encephalopathy.  She has worsening fatigue and malaise over the past 1 to 2 days found to be leaning in her wheelchair to the left side not responding normally.  Patient had a fever of 103 in the emergency room.  Lumbar puncture was negative.  Patient started empirically on antibiotics.  CT scan of the chest shows atelectasis and occlusion of the left lower lobe.  1/31.  Antibiotics changed order Unasyn and Zithromax.  Patient not alert enough this morning to be placed on a diet. 2/1.  Patient's mental status better than yesterday.  Able to be placed back on a diet.  Patient does have a history of dementia.  Will restart oral medications.  Assessment and Plan: * Severe sepsis (HCC) Acute respiratory failure with hypoxia  SIRS criteria of fever and tachycardia Continue Unasyn and Zithromax today since patient has mucous plugging left lower lobe.  Will treat like pneumonia.  Will switch Zithromax over to doxycycline for tomorrow.   Hypotension Holding antihypertensive medication.  IV fluid bolus today.  Start midodrine.  Acute respiratory failure with hypoxia (HCC) Patient was on high flow nasal cannula 10 L this morning and down to 5 L now  Acute metabolic encephalopathy Patient has underlying dementia and fever.  As per patient's daughter.  Patient's mental status improved from yesterday.  Atelectasis Nebulizer treatments  Dementia without behavioral disturbance (HCC) Restart her usual medications        Subjective: Patient able to say a few words.  Patient's daughter states that her mental status is back closer to her baseline.  She recognized her.  Awake enough to be placed  back on a diet.  Physical Exam: Vitals:   01/31/24 0800 01/31/24 1002 01/31/24 1014 01/31/24 1100  BP:    (!) 90/44  Pulse:    75  Resp:    20  Temp: (!) 101.6 F (38.7 C) (!) 100.7 F (38.2 C)  98.2 F (36.8 C)  TempSrc: Axillary Axillary  Axillary  SpO2:  91% 95% 91%  Weight:      Height:       Physical Exam HENT:     Head: Normocephalic.  Eyes:     General: Lids are normal.     Conjunctiva/sclera: Conjunctivae normal.  Cardiovascular:     Rate and Rhythm: Normal rate and regular rhythm.     Heart sounds: Normal heart sounds, S1 normal and S2 normal.  Pulmonary:     Breath sounds: Examination of the right-lower field reveals decreased breath sounds. Examination of the left-lower field reveals decreased breath sounds. Decreased breath sounds present. No wheezing, rhonchi or rales.  Abdominal:     Palpations: Abdomen is soft.     Tenderness: There is no abdominal tenderness.  Musculoskeletal:     Right lower leg: Swelling present.     Left lower leg: Swelling present.  Skin:    General: Skin is warm.     Findings: No rash.  Neurological:     Mental Status: She is alert.     Comments: Patient resisting me moving her legs and arms.     Data Reviewed: Creatinine 0.79, procalcitonin 2.03, white blood count 6.5, hemoglobin 11.3, platelet count 113, blood  cultures negative for 2 days  Family Communication: Spoke with daughter at the bedside  Disposition: Status is: Inpatient Remains inpatient appropriate because: Still spiking fevers and has hypotension.  Planned Discharge Destination: To be determined    Time spent: 28 minutes  Author: Alford Highland, MD 01/31/2024 1:19 PM  For on call review [email or web address].

## 2024-01-31 NOTE — Evaluation (Signed)
Clinical/Bedside Swallow Evaluation Patient Details  Name: Kathryn Morales MRN: [id number] Date of Birth: May 07, 1946  Today's Date: 01/31/2024 Time: SLP Start Time (ACUTE ONLY): 1100 SLP Stop Time (ACUTE ONLY): 1130 SLP Time Calculation (min) (ACUTE ONLY): 30 min  Past Medical History:  Past Medical History:  Diagnosis Date   Anxiety    Hyperlipidemia    Hypertension    Past Surgical History:  Past Surgical History:  Procedure Laterality Date   BREAST SURGERY     TONSILLECTOMY AND ADENOIDECTOMY     TUBAL LIGATION     HPI:  Pr MD progress note, "78 year old female past medical history of dementia, hypertension. She presents to the hospital with fever, hypoxia and acute metabolic encephalopathy. She has worsening fatigue and malaise over the past 1 to 2 days found to be leaning in her wheelchair to the left side not responding normally. Patient had a fever of 103 in the emergency room. Lumbar puncture was negative. Patient started empirically on antibiotics." CT scan of the chest shows "complete atelectasis/collapse of the left lower lobe. Left lower lobe bronchus is completely occluded. Findings favor mucous plugging and postobstructive atelectasis. Follow-up to resolution is recommended as an obstructing bronchial mass is not entirely excluded." Head CT 1/30: No acute intracranial abnormality. Chronic cerebral volume loss and white matter changes. Pt currently on 5L high flow nasal canula and NPO.    Assessment / Plan / Recommendation  Clinical Impression  Pt seen for bedside swallow assessment in the setting of acute respiratory failure with hypoxia and AMS (secondary to acute metabolic encephalopathy and baseline dementia). Pt seen with trials of thin liquids (via straw), puree, and regular solids. No overt or subtle s/sx pharyngeal dysphagia noted. No change to vocal quality across trials. Vitals stable for duration of trials. Oral phase grossly intact- with complete manipulation and  clearance of regular solid from oral cavity. Pt requiring total assist with feeding, with daughter reporting that total assist is likely baseline for pt. Daughter denied previous PNA or dysphagia.   Based on age, pulmonary/respiratory status, mentation, dependency with feeding, and current debility, pt is at increased risk of aspiration, therefore recommend aspiration precautions (slow rate, small bites, elevated HOB, and alert for PO intake). Education shared with daughter regarding aspiration precautions- specifically monitoring alertness during meals and monitoring breathing. Daughter reported understanding. Recommend initiation of thin liquids with regular solids (cut meats) Total assist for meals/PO intake. Medications crushed in puree. RN and MD aware of recommendations. No follow up SLP services indicated at this time. SLP Visit Diagnosis: Dysphagia, unspecified (R13.10)    Aspiration Risk  Moderate aspiration risk    Diet Recommendation   Thin;Age appropriate regular  Medication Administration: Crushed with puree    Other  Recommendations Oral Care Recommendations: Oral care BID    Recommendations for follow up therapy are one component of a multi-disciplinary discharge planning process, led by the attending physician.  Recommendations may be updated based on patient status, additional functional criteria and insurance authorization.  Follow up Recommendations No SLP follow up      Assistance Recommended at Discharge    Functional Status Assessment Patient has not had a recent decline in their functional status (in regards to PO intake)  Frequency and Duration            Prognosis Prognosis for improved oropharyngeal function:  (suspect at baseline)      Swallow Study   General Date of Onset: 01/31/24 HPI: Pr MD progress note, "78 year old female past  medical history of dementia, hypertension. She presents to the hospital with fever, hypoxia and acute metabolic  encephalopathy. She has worsening fatigue and malaise over the past 1 to 2 days found to be leaning in her wheelchair to the left side not responding normally. Patient had a fever of 103 in the emergency room. Lumbar puncture was negative. Patient started empirically on antibiotics." CT scan of the chest shows "complete atelectasis/collapse of the left lower lobe. Left lower lobe bronchus is completely occluded. Findings favor mucous plugging and postobstructive atelectasis. Follow-up to resolution is recommended as an obstructing bronchial mass is not entirely excluded." Head CT 1/30: No acute intracranial abnormality. Chronic cerebral volume loss and white matter changes. Pt currently on 5L high flow nasal canula and NPO. Type of Study: Bedside Swallow Evaluation Previous Swallow Assessment: none in chart Diet Prior to this Study: NPO Temperature Spikes Noted: Yes (WBC 6.5) Respiratory Status: Nasal cannula (HFNC 5L) History of Recent Intubation: No Behavior/Cognition: Alert;Requires cueing Oral Cavity Assessment: Within Functional Limits Oral Care Completed by SLP: Yes Oral Cavity - Dentition: Adequate natural dentition Vision:  (unable to assess) Self-Feeding Abilities: Total assist (daughter reports that is likely pt's baseline) Patient Positioning: Upright in bed Baseline Vocal Quality: Normal Volitional Cough: Congested (spontaneous cough noted for congestion) Volitional Swallow: Unable to elicit    Oral/Motor/Sensory Function Overall Oral Motor/Sensory Function: Within functional limits (grossly intact- though limited command following for thorough eval)   Ice Chips Ice chips: Within functional limits   Thin Liquid Thin Liquid: Within functional limits Presentation: Straw    Nectar Thick Nectar Thick Liquid: Not tested   Honey Thick Honey Thick Liquid: Not tested   Puree Puree: Within functional limits   Solid     Solid: Within functional limits     [place] Brittan Mapel Clapp MS  [facility] SLP  [place] J Clapp 01/31/2024,12:04 PM

## 2024-01-31 NOTE — TOC CM/SW Note (Signed)
CSW called Bonita Quin at [facility] [employer] [employer] - left VM requesting return call.  Alfonso Ramus, LCSW Transitions of Care Department [phone]

## 2024-01-31 NOTE — Plan of Care (Signed)
  Problem: Fluid Volume: Goal: Hemodynamic stability will improve Outcome: Progressing   Problem: Respiratory: Goal: Ability to maintain adequate ventilation will improve Outcome: Progressing   Problem: Activity: Goal: Risk for activity intolerance will decrease Outcome: Progressing   Problem: Safety: Goal: Ability to remain free from injury will improve Outcome: Progressing   Problem: Skin Integrity: Goal: Risk for impaired skin integrity will decrease Outcome: Progressing

## 2024-02-01 ENCOUNTER — Inpatient Hospital Stay: Payer: Medicare PPO

## 2024-02-01 DIAGNOSIS — M6282 Rhabdomyolysis: Secondary | ICD-10-CM | POA: Insufficient documentation

## 2024-02-01 DIAGNOSIS — A419 Sepsis, unspecified organism: Secondary | ICD-10-CM | POA: Diagnosis not present

## 2024-02-01 DIAGNOSIS — I9589 Other hypotension: Secondary | ICD-10-CM | POA: Diagnosis not present

## 2024-02-01 DIAGNOSIS — J9601 Acute respiratory failure with hypoxia: Secondary | ICD-10-CM | POA: Diagnosis not present

## 2024-02-01 DIAGNOSIS — G9341 Metabolic encephalopathy: Secondary | ICD-10-CM | POA: Diagnosis not present

## 2024-02-01 DIAGNOSIS — I5A Non-ischemic myocardial injury (non-traumatic): Secondary | ICD-10-CM | POA: Insufficient documentation

## 2024-02-01 DIAGNOSIS — R6521 Severe sepsis with septic shock: Secondary | ICD-10-CM

## 2024-02-01 LAB — COMPREHENSIVE METABOLIC PANEL
ALT: 28 U/L (ref 0–44)
AST: 70 U/L — ABNORMAL HIGH (ref 15–41)
Albumin: 2.8 g/dL — ABNORMAL LOW (ref 3.5–5.0)
Alkaline Phosphatase: 39 U/L (ref 38–126)
Anion gap: 15 (ref 5–15)
BUN: 19 mg/dL (ref 8–23)
CO2: 28 mmol/L (ref 22–32)
Calcium: 8.7 mg/dL — ABNORMAL LOW (ref 8.9–10.3)
Chloride: 101 mmol/L (ref 98–111)
Creatinine, Ser: 0.83 mg/dL (ref 0.44–1.00)
GFR, Estimated: >60 mL/min (ref >60)
Glucose, Bld: 105 mg/dL — ABNORMAL HIGH (ref 70–99)
Potassium: 4.2 mmol/L (ref 3.5–5.1)
Sodium: 144 mmol/L (ref 135–145)
Total Bilirubin: 0.6 mg/dL (ref 0.0–1.2)
Total Protein: 5.9 g/dL — ABNORMAL LOW (ref 6.5–8.1)

## 2024-02-01 LAB — CBC WITH DIFFERENTIAL/PLATELET
Abs Immature Granulocytes: 0.06 10*3/uL (ref 0.00–0.07)
Basophils Absolute: 0 10*3/uL (ref 0.0–0.1)
Basophils Relative: 0 %
Eosinophils Absolute: 0 10*3/uL (ref 0.0–0.5)
Eosinophils Relative: 0 %
HCT: 35.9 % — ABNORMAL LOW (ref 36.0–46.0)
Hemoglobin: 11.8 g/dL — ABNORMAL LOW (ref 12.0–15.0)
Immature Granulocytes: 1 %
Lymphocytes Relative: 17 %
Lymphs Abs: 1 10*3/uL (ref 0.7–4.0)
MCH: 32.3 pg (ref 26.0–34.0)
MCHC: 32.9 g/dL (ref 30.0–36.0)
MCV: 98.4 fL (ref 80.0–100.0)
Monocytes Absolute: 0.3 10*3/uL (ref 0.1–1.0)
Monocytes Relative: 6 %
Neutro Abs: 4.6 10*3/uL (ref 1.7–7.7)
Neutrophils Relative %: 76 %
Platelets: 135 10*3/uL — ABNORMAL LOW (ref 150–400)
RBC: 3.65 MIL/uL — ABNORMAL LOW (ref 3.87–5.11)
RDW: 13 % (ref 11.5–15.5)
WBC: 5.9 10*3/uL (ref 4.0–10.5)
nRBC: 0 % (ref 0.0–0.2)

## 2024-02-01 LAB — BRAIN NATRIURETIC PEPTIDE: B Natriuretic Peptide: 366.9 pg/mL — ABNORMAL HIGH (ref 0.0–100.0)

## 2024-02-01 LAB — GLUCOSE, CAPILLARY
Glucose-Capillary: 138 mg/dL — ABNORMAL HIGH (ref 70–99)
Glucose-Capillary: 140 mg/dL — ABNORMAL HIGH (ref 70–99)
Glucose-Capillary: 185 mg/dL — ABNORMAL HIGH (ref 70–99)

## 2024-02-01 LAB — TROPONIN I (HIGH SENSITIVITY)
Troponin I (High Sensitivity): 89 ng/L — ABNORMAL HIGH (ref <18)
Troponin I (High Sensitivity): 76 ng/L — ABNORMAL HIGH (ref <18)

## 2024-02-01 LAB — CSF CULTURE W GRAM STAIN
Culture: NO GROWTH
Gram Stain: NONE SEEN

## 2024-02-01 LAB — MAGNESIUM: Magnesium: 1.8 mg/dL (ref 1.7–2.4)

## 2024-02-01 LAB — PROCALCITONIN: Procalcitonin: 1.59 ng/mL

## 2024-02-01 LAB — MRSA NEXT GEN BY PCR, NASAL: MRSA by PCR Next Gen: NOT DETECTED

## 2024-02-01 MED ORDER — VANCOMYCIN HCL 1500 MG/300ML IV SOLN
1500.0000 mg | Freq: Once | INTRAVENOUS | Status: AC
  Administered 2024-02-01: 1500 mg via INTRAVENOUS
  Filled 2024-02-01: qty 300

## 2024-02-01 MED ORDER — IPRATROPIUM-ALBUTEROL 0.5-2.5 (3) MG/3ML IN SOLN
3.0000 mL | RESPIRATORY_TRACT | Status: DC | PRN
  Administered 2024-02-01: 3 mL via RESPIRATORY_TRACT

## 2024-02-01 MED ORDER — FENTANYL CITRATE PF 50 MCG/ML IJ SOSY
PREFILLED_SYRINGE | INTRAMUSCULAR | Status: AC
  Filled 2024-02-01: qty 2

## 2024-02-01 MED ORDER — DOXYCYCLINE HYCLATE 100 MG PO TABS: 100.0000 mg | ORAL_TABLET | Freq: Two times a day (BID) | ORAL | Status: DC

## 2024-02-01 MED ORDER — BUDESONIDE 0.25 MG/2ML IN SUSP
0.2500 mg | Freq: Two times a day (BID) | RESPIRATORY_TRACT | Status: DC
  Administered 2024-02-01 – 2024-02-02 (×3): 0.25 mg via RESPIRATORY_TRACT
  Filled 2024-02-01 (×3): qty 2

## 2024-02-01 MED ORDER — FUROSEMIDE 10 MG/ML IJ SOLN
40.0000 mg | Freq: Once | INTRAMUSCULAR | Status: AC
  Administered 2024-02-01: 40 mg via INTRAVENOUS
  Filled 2024-02-01: qty 4

## 2024-02-01 MED ORDER — SODIUM CHLORIDE 0.9 % IV SOLN
2.0000 g | Freq: Two times a day (BID) | INTRAVENOUS | Status: DC
  Filled 2024-02-01: qty 12.5

## 2024-02-01 MED ORDER — VANCOMYCIN HCL IN DEXTROSE 1-5 GM/200ML-% IV SOLN: 1000.0000 mg | INTRAVENOUS | Status: DC

## 2024-02-01 MED ORDER — ROCURONIUM BROMIDE 10 MG/ML (PF) SYRINGE: 40.0000 mg | PREFILLED_SYRINGE | Freq: Once | INTRAVENOUS | Status: AC

## 2024-02-01 MED ORDER — NOREPINEPHRINE 4 MG/250ML-% IV SOLN: 2.0000 ug/min | INTRAVENOUS | Status: DC

## 2024-02-01 MED ORDER — CHLORHEXIDINE GLUCONATE CLOTH 2 % EX PADS: 6.0000 | MEDICATED_PAD | Freq: Every day | CUTANEOUS | Status: DC

## 2024-02-01 MED ORDER — FENTANYL CITRATE (PF) 100 MCG/2ML IJ SOLN: 100.0000 ug | Freq: Once | INTRAMUSCULAR | Status: AC

## 2024-02-01 MED ORDER — FENTANYL CITRATE (PF) 100 MCG/2ML IJ SOLN
INTRAMUSCULAR | Status: AC
  Administered 2024-02-01: 100 ug via INTRAVENOUS
  Filled 2024-02-01: qty 2

## 2024-02-01 MED ORDER — METHYLPREDNISOLONE SODIUM SUCC 125 MG IJ SOLR
125.0000 mg | Freq: Once | INTRAMUSCULAR | Status: AC
  Administered 2024-02-01: 125 mg via INTRAVENOUS
  Filled 2024-02-01: qty 2

## 2024-02-01 MED ORDER — ROCURONIUM BROMIDE 10 MG/ML (PF) SYRINGE
PREFILLED_SYRINGE | INTRAVENOUS | Status: AC
  Administered 2024-02-01: 40 mg via INTRAVENOUS
  Filled 2024-02-01: qty 10

## 2024-02-01 MED ORDER — NOREPINEPHRINE 4 MG/250ML-% IV SOLN
INTRAVENOUS | Status: AC
  Administered 2024-02-01: 2 ug/min via INTRAVENOUS
  Filled 2024-02-01: qty 250

## 2024-02-01 MED ORDER — HEPARIN SODIUM (PORCINE) 5000 UNIT/ML IJ SOLN
5000.0000 [IU] | Freq: Three times a day (TID) | INTRAMUSCULAR | Status: DC
  Administered 2024-02-01 – 2024-02-02 (×3): 5000 [IU] via SUBCUTANEOUS
  Filled 2024-02-01 (×3): qty 1

## 2024-02-01 MED ORDER — CHLORHEXIDINE GLUCONATE CLOTH 2 % EX PADS
6.0000 | MEDICATED_PAD | Freq: Every evening | CUTANEOUS | Status: DC
  Administered 2024-02-01 – 2024-02-08 (×8): 6 via TOPICAL

## 2024-02-01 MED ORDER — METHYLPREDNISOLONE SODIUM SUCC 40 MG IJ SOLR
40.0000 mg | Freq: Two times a day (BID) | INTRAMUSCULAR | Status: DC
  Administered 2024-02-01 – 2024-02-02 (×2): 40 mg via INTRAVENOUS
  Filled 2024-02-01 (×2): qty 1

## 2024-02-01 MED ORDER — SODIUM CHLORIDE 0.9 % IV SOLN
250.0000 mL | INTRAVENOUS | Status: AC
  Administered 2024-02-01: 250 mL via INTRAVENOUS

## 2024-02-01 MED ORDER — SODIUM CHLORIDE 0.9 % IV SOLN
100.0000 mg | Freq: Two times a day (BID) | INTRAVENOUS | Status: DC
  Filled 2024-02-01: qty 100

## 2024-02-01 MED ORDER — PIPERACILLIN-TAZOBACTAM 3.375 G IVPB
3.3750 g | Freq: Three times a day (TID) | INTRAVENOUS | Status: AC
  Administered 2024-02-01 – 2024-02-05 (×15): 3.375 g via INTRAVENOUS
  Filled 2024-02-01 (×15): qty 50

## 2024-02-01 MED ORDER — MIDAZOLAM HCL 2 MG/2ML IJ SOLN
INTRAMUSCULAR | Status: AC
  Filled 2024-02-01: qty 4

## 2024-02-01 MED ORDER — NOREPINEPHRINE 4 MG/250ML-% IV SOLN: 0.0000 ug/min | INTRAVENOUS | Status: DC

## 2024-02-01 MED ORDER — ACETAMINOPHEN 10 MG/ML IV SOLN
1000.0000 mg | Freq: Once | INTRAVENOUS | Status: AC
  Administered 2024-02-01: 1000 mg via INTRAVENOUS
  Filled 2024-02-01: qty 100

## 2024-02-01 NOTE — Assessment & Plan Note (Signed)
Secondary to demand ischemia with acute respiratory failure requiring BiPAP.

## 2024-02-01 NOTE — Consult Note (Signed)
CRITICAL CARE    Name: Kathryn Morales MRN: [id number] DOB: 06-20-1946     LOS: 3   SUBJECTIVE FINDINGS & SIGNIFICANT EVENTS    History of Presenting Illness:  This is a 78 yo F with hx of HTN, dementia who came in with acute hypoxemic respiratory failure.  I was unable to get history from patient due to severe hypoxemia on BIPAP and met with her son Luv Mish and daughter [name].  They explain that she had outpatient treatment with abx and failure of therapy for pneumonia with 2 abx courses over past 2 weeks.  The son and daughter are medical POA for patient.  We reviewed her CT scan with findings of complete atelectasis/collapse of the left lower lobe. Left lower lobe bronchus is completely occluded. Her blood work reveals chronic thrombocytopenia with platelets at 127 appx 1 year ago. She required 80% FiO2 on bipap and had labored breathing.  We discussed code status at length. There was discussion regarding intubation and possible bronchoscopy due to CT chest findings with goal of unplugging of left lung with therapeutic aspiration.  Family wanted to discuss among themselves and share that they wish to have full scope of therapy but keep DNR status.   Lines/tubes : External Urinary Catheter (Active)  Dedicated Suction Verified suction is between 40-80 mmHg 02/01/24 0800  Securement Method None needed 02/01/24 0800  Site Assessment Clean, Dry, Intact 02/01/24 0800  Intervention No interventions needed at this time 02/01/24 0800    Microbiology/Sepsis markers: Results for orders placed or performed during the hospital encounter of 01/29/24  Resp panel by RT-PCR (RSV, Flu A&B, Covid) Anterior Nasal Swab     Status: None   Collection Time: 01/29/24  9:59 AM   Specimen: Anterior Nasal Swab  Result Value Ref  Range Status   SARS Coronavirus 2 by RT PCR NEGATIVE NEGATIVE Final    Comment: (NOTE) SARS-CoV-2 target nucleic acids are NOT DETECTED.  The SARS-CoV-2 RNA is generally detectable in upper respiratory specimens during the acute phase of infection. The lowest concentration of SARS-CoV-2 viral copies this assay can detect is 138 copies/mL. A negative result does not preclude SARS-Cov-2 infection and should not be used as the sole basis for treatment or other patient management decisions. A negative result may occur with  improper specimen collection/handling, submission of specimen other than nasopharyngeal swab, presence of viral mutation(s) within the areas targeted by this assay, and inadequate number of viral copies(<138 copies/mL). A negative result must be combined with clinical observations, patient history, and epidemiological information. The expected result is Negative.  Fact Sheet for Patients:  BloggerCourse.com  Fact Sheet for Healthcare Providers:  SeriousBroker.it  This test is no t yet approved or cleared by the [place] FDA and  has been authorized for detection and/or diagnosis of SARS-CoV-2 by FDA under an Emergency Use Authorization (EUA). This EUA will remain  in effect (meaning this test can be used) for the duration of the COVID-19 declaration under Section 564(b)(1) of the Act, 21 U.S.C.section 360bbb-3(b)(1), unless the authorization is terminated  or revoked sooner.       Influenza A by PCR NEGATIVE NEGATIVE Final   Influenza B by PCR NEGATIVE NEGATIVE Final    Comment: (NOTE) The Xpert Xpress SARS-CoV-2/FLU/RSV plus assay is intended as an aid in the diagnosis of influenza from Nasopharyngeal swab specimens and should not be used as a sole basis for treatment. Nasal washings and aspirates are unacceptable for Xpert Xpress SARS-CoV-2/FLU/RSV  testing.  Fact Sheet for  Patients: BloggerCourse.com  Fact Sheet for Healthcare Providers: SeriousBroker.it  This test is not yet approved or cleared by the [place] FDA and has been authorized for detection and/or diagnosis of SARS-CoV-2 by FDA under an Emergency Use Authorization (EUA). This EUA will remain in effect (meaning this test can be used) for the duration of the COVID-19 declaration under Section 564(b)(1) of the Act, 21 U.S.C. section 360bbb-3(b)(1), unless the authorization is terminated or revoked.     Resp Syncytial Virus by PCR NEGATIVE NEGATIVE Final    Comment: (NOTE) Fact Sheet for Patients: BloggerCourse.com  Fact Sheet for Healthcare Providers: SeriousBroker.it  This test is not yet approved or cleared by the [place] FDA and has been authorized for detection and/or diagnosis of SARS-CoV-2 by FDA under an Emergency Use Authorization (EUA). This EUA will remain in effect (meaning this test can be used) for the duration of the COVID-19 declaration under Section 564(b)(1) of the Act, 21 U.S.C. section 360bbb-3(b)(1), unless the authorization is terminated or revoked.  Performed at [facility], [address] [place], [place] [address]   Blood Culture (routine x 2)     Status: None (Preliminary result)   Collection Time: 01/29/24 10:04 AM   Specimen: BLOOD RIGHT ARM  Result Value Ref Range Status   Specimen Description BLOOD RIGHT ARM  Final   Special Requests   Final    BOTTLES DRAWN AEROBIC AND ANAEROBIC Blood Culture results may not be optimal due to an inadequate volume of blood received in culture bottles   Culture   Final    NO GROWTH 3 DAYS Performed at [facility], [address], [place], [place] [address]    Report Status PENDING  Incomplete  Blood Culture (routine x 2)     Status: None (Preliminary result)   Collection Time: 01/29/24  10:04 AM   Specimen: BLOOD LEFT ARM  Result Value Ref Range Status   Specimen Description BLOOD LEFT ARM  Final   Special Requests   Final    BOTTLES DRAWN AEROBIC AND ANAEROBIC Blood Culture adequate volume   Culture   Final    NO GROWTH 3 DAYS Performed at [facility], [address], [place], [place] [address]    Report Status PENDING  Incomplete  CSF culture w Gram Stain     Status: None   Collection Time: 01/29/24  2:57 PM   Specimen: PATH Cytology CSF; Cerebrospinal Fluid  Result Value Ref Range Status   Specimen Description   Final    CSF Performed at [facility], [address], [place], [place] [address]    Special Requests   Final    NONE Performed at [facility], [address] [place], [place] [address]    Gram Stain   Final    NO ORGANISMS SEEN RED BLOOD CELLS PRESENT WBC SEEN    Culture   Final    NO GROWTH 3 DAYS Performed at [facility], [address], [place], [place] [address]    Report Status 02/01/2024 FINAL  Final  MRSA Next Gen by PCR, Nasal     Status: None   Collection Time: 02/01/24  5:03 AM   Specimen: Nasal Mucosa; Nasal Swab  Result Value Ref Range Status   MRSA by PCR Next Gen NOT DETECTED NOT DETECTED Final    Comment: (NOTE) The GeneXpert MRSA Assay (FDA approved for NASAL specimens only), is one component of a  comprehensive MRSA colonization surveillance program. It is not intended to diagnose MRSA infection nor to guide or monitor treatment for MRSA infections. Test performance is not FDA approved in patients less than 12 years old. Performed at [facility], [address], [place], [place] [address]     Anti-infectives:  Anti-infectives (From admission, onward)    Start     Dose/Rate Route Frequency Ordered Stop   02/02/24 0700  vancomycin (VANCOCIN) IVPB 1000 mg/200 mL premix  Status:  Discontinued        1,000 mg 200 mL/hr over 60 Minutes Intravenous Every 24 hours 02/01/24  0557 02/01/24 1102   02/01/24 1000  doxycycline (VIBRA-TABS) tablet 100 mg  Status:  Discontinued        100 mg Oral Every 12 hours 01/31/24 1315 02/01/24 0439   02/01/24 1000  doxycycline (VIBRA-TABS) tablet 100 mg  Status:  Discontinued        100 mg Oral Every 12 hours 02/01/24 0435 02/01/24 0439   02/01/24 0700  piperacillin-tazobactam (ZOSYN) IVPB 3.375 g        3.375 g 12.5 mL/hr over 240 Minutes Intravenous Every 8 hours 02/01/24 0600     02/01/24 0600  ceFEPIme (MAXIPIME) 2 g in sodium chloride 0.9 % 100 mL IVPB  Status:  Discontinued        2 g 200 mL/hr over 30 Minutes Intravenous Every 12 hours 02/01/24 0512 02/01/24 0559   02/01/24 0600  vancomycin (VANCOREADY) IVPB 1500 mg/300 mL        1,500 mg 150 mL/hr over 120 Minutes Intravenous  Once 02/01/24 0512 02/01/24 0818   02/01/24 0530  doxycycline (VIBRAMYCIN) 100 mg in sodium chloride 0.9 % 250 mL IVPB  Status:  Discontinued        100 mg 125 mL/hr over 120 Minutes Intravenous Every 12 hours 02/01/24 0439 02/01/24 0500   01/31/24 1000  azithromycin (ZITHROMAX) tablet 250 mg  Status:  Discontinued       Placed in "Followed by" Linked Group   250 mg Oral Daily 01/30/24 0716 01/30/24 0855   01/30/24 1800  vancomycin (VANCOCIN) IVPB 750 mg/150 ml premix  Status:  Discontinued        750 mg 150 mL/hr over 60 Minutes Intravenous Every 24 hours 01/29/24 1711 01/30/24 0949   01/30/24 1800  vancomycin (VANCOCIN) IVPB 1000 mg/200 mL premix  Status:  Discontinued        1,000 mg 200 mL/hr over 60 Minutes Intravenous Every 24 hours 01/30/24 0950 01/30/24 0953   01/30/24 1800  Ampicillin-Sulbactam (UNASYN) 3 g in sodium chloride 0.9 % 100 mL IVPB  Status:  Discontinued        3 g 200 mL/hr over 30 Minutes Intravenous Every 6 hours 01/30/24 0953 02/01/24 0508   01/30/24 1000  azithromycin (ZITHROMAX) tablet 500 mg  Status:  Discontinued       Placed in "Followed by" Linked Group   500 mg Oral Daily 01/30/24 0716 01/30/24 0855   01/30/24  1000  azithromycin (ZITHROMAX) 500 mg in sodium chloride 0.9 % 250 mL IVPB  Status:  Discontinued        500 mg 250 mL/hr over 60 Minutes Intravenous Every 24 hours 01/30/24 0855 01/31/24 1315   01/29/24 2200  metroNIDAZOLE (FLAGYL) IVPB 500 mg  Status:  Discontinued        500 mg 100 mL/hr over 60 Minutes Intravenous Every 12 hours 01/29/24 1645 01/30/24 0717   01/29/24 2200  ceFEPIme (MAXIPIME) 2  g in sodium chloride 0.9 % 100 mL IVPB  Status:  Discontinued        2 g 200 mL/hr over 30 Minutes Intravenous Every 12 hours 01/29/24 1702 01/30/24 0953   01/29/24 1800  vancomycin (VANCOREADY) IVPB 500 mg/100 mL        500 mg 100 mL/hr over 60 Minutes Intravenous  Once 01/29/24 1704 01/29/24 1903   01/29/24 1000  ceFEPIme (MAXIPIME) 2 g in sodium chloride 0.9 % 100 mL IVPB        2 g 200 mL/hr over 30 Minutes Intravenous  Once 01/29/24 0959 01/29/24 1059   01/29/24 1000  metroNIDAZOLE (FLAGYL) IVPB 500 mg        500 mg 100 mL/hr over 60 Minutes Intravenous  Once 01/29/24 0959 01/29/24 1207   01/29/24 1000  vancomycin (VANCOCIN) IVPB 1000 mg/200 mL premix        1,000 mg 200 mL/hr over 60 Minutes Intravenous  Once 01/29/24 2956 01/29/24 1356        Consults: Treatment Team:  Vida Rigger, MD     PAST MEDICAL HISTORY   Past Medical History:  Diagnosis Date   Anxiety    Hyperlipidemia    Hypertension      SURGICAL HISTORY   Past Surgical History:  Procedure Laterality Date   BREAST SURGERY     TONSILLECTOMY AND ADENOIDECTOMY     TUBAL LIGATION       FAMILY HISTORY   Family History  Problem Relation Age of Onset   Coronary artery disease Mother    Heart disease Mother    Lupus Sister    Diabetes Brother    Asthma Maternal Grandfather    Breast cancer Paternal Grandmother    Heart attack Paternal Grandfather    Hypothyroidism Daughter    Anxiety disorder Daughter    Hypothyroidism Maternal Grandmother      SOCIAL HISTORY   Social History   Tobacco  Use   Smoking status: Never   Smokeless tobacco: Never  Vaping Use   Vaping status: Never Used  Substance Use Topics   Alcohol use: Yes    Comment: social- minimal   Drug use: No     MEDICATIONS   Current Medication:  Current Facility-Administered Medications:    0.9 %  sodium chloride infusion, 250 mL, Intravenous, Continuous, Chrisma Hurlock, MD, Last Rate: 10 mL/hr at 02/01/24 0945, 250 mL at 02/01/24 0945   acetaminophen (TYLENOL) tablet 650 mg, 650 mg, Oral, Q6H PRN, Alford Highland, MD, 650 mg at 01/31/24 2036   budesonide (PULMICORT) nebulizer solution 0.25 mg, 0.25 mg, Nebulization, BID, Manuela Schwartz, NP, 0.25 mg at 02/01/24 2130   Chlorhexidine Gluconate Cloth 2 % PADS 6 each, 6 each, Topical, Nightly, Wieting, Richard, MD   donepezil (ARICEPT) tablet 10 mg, 10 mg, Oral, q AM, Wieting, Richard, MD   fentaNYL (SUBLIMAZE) 50 MCG/ML injection, , , ,    heparin injection 5,000 Units, 5,000 Units, Subcutaneous, Q8H, Magalene Mclear, MD   ibuprofen (ADVIL) tablet 400 mg, 400 mg, Oral, Q6H PRN, Wieting, Richard, MD   ipratropium-albuterol (DUONEB) 0.5-2.5 (3) MG/3ML nebulizer solution 3 mL, 3 mL, Nebulization, BID, Wieting, Richard, MD, 3 mL at 02/01/24 0955   ipratropium-albuterol (DUONEB) 0.5-2.5 (3) MG/3ML nebulizer solution 3 mL, 3 mL, Nebulization, Q4H PRN, Manuela Schwartz, NP, 3 mL at 02/01/24 0351   melatonin tablet 5 mg, 5 mg, Oral, QHS, Wieting, Richard, MD, 5 mg at 01/31/24 2051   methylPREDNISolone sodium succinate (SOLU-MEDROL) 40 mg/mL injection  40 mg, 40 mg, Intravenous, Q12H, Manuela Schwartz, NP   midazolam (VERSED) 2 MG/2ML injection, , , ,    midodrine (PROAMATINE) tablet 10 mg, 10 mg, Oral, TID WC, Wieting, Richard, MD, 10 mg at 01/31/24 1701   norepinephrine (LEVOPHED) 4mg  in (0.016 mg/mL) premix infusion, 2-10 mcg/min, Intravenous, Titrated, Vida Rigger, MD, Last Rate: 7.5 mL/hr at 02/01/24 0942, 2 mcg/min at 02/01/24 0942   ondansetron (ZOFRAN)  tablet 4 mg, 4 mg, Oral, Q6H PRN **OR** ondansetron (ZOFRAN) injection 4 mg, 4 mg, Intravenous, Q6H PRN, Floydene Flock, MD   piperacillin-tazobactam (ZOSYN) IVPB 3.375 g, 3.375 g, Intravenous, Q8H, Wieting, Richard, MD, Last Rate: 12.5 mL/hr at 02/01/24 0810, 3.375 g at 02/01/24 0810    ALLERGIES   Patient has no known allergies.    REVIEW OF SYSTEMS     Unable to collect due to severe hypoxemia with circulatory shock  PHYSICAL EXAMINATION   Vital Signs: Temp:  [98.1 F (36.7 C)-106.2 F (41.2 C)] 98.1 F (36.7 C) (02/02 1045) Pulse Rate:  [55-97] 57 (02/02 1215) Resp:  [17-45] 20 (02/02 1215) BP: (76-139)/(42-86) 83/46 (02/02 1215) SpO2:  [74 %-100 %] 97 % (02/02 1215) FiO2 (%):  [80 %-100 %] 80 % (02/02 1048)  GENERAL:Distress due to acutely ill status HEAD: Normocephalic, atraumatic.  EYES: Pupils equal, round, reactive to light.  No scleral icterus.  MOUTH: Moist mucosal membrane. NECK: Supple. No thyromegaly. No nodules. No JVD.  PULMONARY: Decreased air entry on left, rhonchi on right  CARDIOVASCULAR: S1 and S2. Regular rate and rhythm. No murmurs, rubs, or gallops.  GASTROINTESTINAL: Soft, nontender, non-distended. No masses. Positive bowel sounds. No hepatosplenomegaly.  MUSCULOSKELETAL: No swelling, clubbing, or edema.  NEUROLOGIC: Mild distress due to acute illness SKIN:intact,warm,dry   PERTINENT DATA     Infusions:  sodium chloride 250 mL (02/01/24 0945)   norepinephrine (LEVOPHED) Adult infusion 2 mcg/min (02/01/24 0942)   piperacillin-tazobactam (ZOSYN)  IV 3.375 g (02/01/24 0810)   Scheduled Medications:  budesonide (PULMICORT) nebulizer solution  0.25 mg Nebulization BID   Chlorhexidine Gluconate Cloth  6 each Topical Nightly   donepezil  10 mg Oral q AM   fentaNYL       heparin injection (subcutaneous)  5,000 Units Subcutaneous Q8H   ipratropium-albuterol  3 mL Nebulization BID   melatonin  5 mg Oral QHS   methylPREDNISolone  (SOLU-MEDROL) injection  40 mg Intravenous Q12H   midazolam       midodrine  10 mg Oral TID WC   PRN Medications: acetaminophen, fentaNYL, ibuprofen, ipratropium-albuterol, midazolam, ondansetron **OR** ondansetron (ZOFRAN) IV Hemodynamic parameters:   Intake/Output: 02/01 0701 - 02/02 0700 In: 1257.1 [P.O.:337; I.V.:66; IV Piggyback:854.1] Out: 700 [Urine:700]  Ventilator  Settings: FiO2 (%):  [80 %-100 %] 80 % PEEP:  [5 cmH20] 5 cmH20 Pressure Support:  [5 cmH20] 5 cmH20   LAB RESULTS:  Basic Metabolic Panel: Recent Labs  Lab 01/29/24 0946 01/30/24 0437 01/31/24 0342 02/01/24 0459  NA 140 136 140 144  K 4.2 3.5 3.9 4.2  CL 100 99 100 101  CO2 30 28 30 28   GLUCOSE 115* 109* 101* 105*  BUN 22 13 15 19   CREATININE 1.08* 0.79 0.79 0.83  CALCIUM 9.1 8.5* 8.3* 8.7*  MG  --   --   --  1.8   Liver Function Tests: Recent Labs  Lab 01/29/24 0946 01/30/24 0437 02/01/24 0459  AST 27 52* 70*  ALT 16 21 28   ALKPHOS 45 38 39  BILITOT  0.9 0.8 0.6  PROT 7.1 6.3* 5.9*  ALBUMIN 4.0 3.4* 2.8*   No results for input(s): "LIPASE", "AMYLASE" in the last 168 hours. No results for input(s): "AMMONIA" in the last 168 hours. CBC: Recent Labs  Lab 01/29/24 1004 01/30/24 0437 01/31/24 0342 02/01/24 0459  WBC 6.5 8.5 6.5 5.9  NEUTROABS 4.7  --   --  4.6  HGB 14.0 12.8 11.3* 11.8*  HCT 42.3 38.5 33.2* 35.9*  MCV 96.8 97.5 95.1 98.4  PLT 161 124* 113* 135*   Cardiac Enzymes: Recent Labs  Lab 01/31/24 0341  CKTOTAL 1,709*   BNP: Invalid input(s): "POCBNP" CBG: Recent Labs  Lab 01/29/24 1001 01/30/24 0404 02/01/24 1118  GLUCAP 107* 113* 138*       IMAGING RESULTS:  Pre and post bronchoscopy CXR 02/01/24     ASSESSMENT AND PLAN    -Multidisciplinary rounds held today   Acute Hypoxic Respiratory Failure -due to pneumonia vs mucus plugging of left lung -continue Bronchodilator Therapy -Wean Fio2 and PEEP as tolerated -liberate from MV as soon as  possible - continue solumedrol and abx. Keep zosyn and dc vanco per pharmD due to MRSA negative status -procal trend- 2.03  -additionally patient need to have PE workup due to hypotension with thrombocytopenia and elevated cardiac biomarkers, however she is not tachycardic. Previous CT without contrast due to AKI.  She is receiving heparin TID [place].   Complete atelectasis of left lung -s/p bronchoscopy  -BAL sent for microbiology   Septic shock- present on admission - suspect due to pneumonia -use vasopressors, currently on levophed  to keep MAP>65 -follow ABG and LA -follow up cultures -emperic ABX- zosyn  -solumedrol 40 bid- wean as able   ID -continue IV abx as prescibed -follow up cultures  GI/Nutrition GI PROPHYLAXIS as indicated DIET-->TF's as tolerated Constipation protocol as indicated  ENDO - ICU hypoglycemic\Hyperglycemia protocol -check FSBS per protocol   ELECTROLYTES -follow labs as needed -replace as needed -pharmacy consultation   DVT/GI PRX ordered -SCDs  TRANSFUSIONS AS NEEDED MONITOR FSBS ASSESS the need for LABS as needed    Critical care provider statement:   Total critical care time: 62 minutes   Performed by: Karna Christmas MD   Critical care time was exclusive of separately billable procedures and treating other patients.   Critical care was necessary to treat or prevent imminent or life-threatening deterioration.   Critical care was time spent personally by me on the following activities: development of treatment plan with patient and/or surrogate as well as nursing, discussions with consultants, evaluation of patient's response to treatment, examination of patient, obtaining history from patient or surrogate, ordering and performing treatments and interventions, ordering and review of laboratory studies, ordering and review of radiographic studies, pulse oximetry and re-evaluation of patient's condition.    Vida Rigger, M.D.  Pulmonary  & Critical Care Medicine

## 2024-02-01 NOTE — Procedures (Signed)
PROCEDURE: BRONCHOSCOPY Therapeutic Aspiration of Tracheobronchial Tree Fiberoptic bronchoscopy with bronchoalveolar lavage  PROCEDURE DATE: 02/01/2024  TIME:  NAME:  Kathryn Morales  DOB:04/26/1946  MRN: [id number] LOC:  IC05A/IC05A-AA    HOSP DAY: @LENGTHOFSTAYDAYS @ CODE STATUS:      Code Status Orders  (From admission, onward)           Start     Ordered   01/29/24 1646  Do not attempt resuscitation (DNR)- Limited -Do Not Intubate (DNI)  Continuous       Question Answer Comment  If pulseless and not breathing No CPR or chest compressions.   In Pre-Arrest Conditions (Patient Is Breathing and Has A Pulse) Do not intubate. Provide all appropriate non-invasive medical interventions. Avoid ICU transfer unless indicated or required.   Consent: Discussion documented in EHR or advanced directives reviewed      01/29/24 1646           Code Status History     Date Active Date Inactive Code Status Order ID Comments User Context   01/29/2024 1023 01/29/2024 1646 Limited: Do not attempt resuscitation (DNR) -DNR-LIMITED -Do Not Intubate/DNI  [id number]  Merwyn Katos, MD ED           Indications/Preliminary Diagnosis:   Consent: (Place X beside choice/s below)  The benefits, risks and possible complications of the procedure were        explained to:  ___ patient  _x__ patient's family  ___ other:___________  who verbalized understanding and gave:  ___ verbal  __x_ written  ___ verbal and written  ___ telephone  ___ other:________ consent.      Unable to obtain consent; procedure performed on emergent basis.     Other:       PRESEDATION ASSESSMENT: History and Physical has been performed. Patient meds and allergies have been reviewed. Presedation airway examination has been performed and documented. Baseline vital signs, sedation score, oxygenation status, and cardiac rhythm were reviewed. Patient was deemed to be in satisfactory condition to undergo the procedure.     PREMEDICATIONS:   Sedative/Narcotic Amt Dose   Versed  mg   Fentanyl gtt mcg  Diprivan gtt mg            PROCEDURE DETAILS: Timeout performed and correct patient, name, & ID confirmed. Following prep per Pulmonary policy, appropriate sedation was administered. The Bronchoscope was inserted in to oral cavity with bite block in place. Therapeutic aspiration of Tracheobronchial tree was performed.  Airway exam proceeded with findings, technical procedures, and specimen collection as noted below. At the end of exam the scope was withdrawn without incident. Impression and Plan as noted below.           Airway Prep (Place X beside choice below)   1% Transtracheal Lidocaine Anesthetization 7 cc   Patient prepped per Bronchoscopy Lab Policy       Insertion Route (Place X beside choice below)   Nasal   Oral  x Endotracheal Tube   Tracheostomy   INTRAPROCEDURE MEDICATIONS:  Sedative/Narcotic Amt Dose   Versed  mg   Fentanyl 100 mcg  Diprivan  mg       Medication Amt Dose  Medication Amt Dose  Lidocaine 1%  cc  Epinephrine 1:10,000 sol  cc  Xylocaine 4%  cc  Cocaine  cc   TECHNICAL PROCEDURES: (Place X beside choice below)   Procedures  Description    None     Electrocautery     Cryotherapy  Balloon Dilatation     Bronchography     Stent Placement   x  Therapeutic Aspiration Left lower lobe and lingular segments    Laser/Argon Plasma    Brachytherapy Catheter Placement    Foreign Body Removal         SPECIMENS (Sites): (Place X beside choice below)  Specimens Description   No Specimens Obtained     Washings   x Lavage Lingula inferior segment   Biopsies    Fine Needle Aspirates    Brushings    Sputum    FINDINGS:    Media Information  Document Information   Media Information  Document Information     ESTIMATED BLOOD LOSS: none COMPLICATIONS/RESOLUTION: none      IMPRESSION:POST-PROCEDURE DX:   Severe erythematous edematous  mucosa bilaterally with complete occlusion of lingular segments and similar findings in basilar left lower lobe segments.  Multiple therapeutic aspirations of tracheobronchial tree performed at left lower lung and lingular segments.  BAL was done at inferior lingula sent for microbiology   RECOMMENDATION/PLAN:   Steroids due to severe edema and erythema of pulmonary mucosa in context of severe pneumonia.  Await BAL cultures results.     Vida Rigger, M.D.  Pulmonary & Critical Care Medicine  [facility]

## 2024-02-01 NOTE — Progress Notes (Addendum)
Pharmacy Antibiotic Note  Kathryn Morales is a 78 y.o. female admitted on 01/29/2024 with sepsis.  Pharmacy has been consulted for Vanc, Zosyn dosing.  Plan: Zosyn 3.375 gm IV Q8H EI ordered to start on 2/2 @ 0700.  Vancomycin 1500 mg IV X 1 ordered for 2/2 @ ~ 0700. Vancomycin 1 gm IV Q24H ordered to start on 2/3 @ 0700.  AUC = 527 Vanc trough = 13.1   Height: 5\' 2"  (157.5 cm) Weight: 61.2 kg (135 lb) IBW/kg (Calculated) : 50.1  Temp (24hrs), Avg:100.9 F (38.3 C), Min:98.2 F (36.8 C), Max:106.2 F (41.2 C)  Recent Labs  Lab 01/29/24 0946 01/29/24 0959 01/29/24 1004 01/29/24 1212 01/30/24 0437 01/31/24 0342 02/01/24 0459  WBC  --   --  6.5  --  8.5 6.5 5.9  CREATININE 1.08*  --   --   --  0.79 0.79  --   LATICACIDVEN  --  1.7  --  1.4  --   --   --     Estimated Creatinine Clearance: 50.7 mL/min (by C-G formula based on SCr of 0.79 mg/dL).    No Known Allergies  Antimicrobials this admission:   >>    >>   Dose adjustments this admission:   Microbiology results:  BCx:   UCx:    Sputum:    MRSA PCR:   Thank you for allowing pharmacy to be a part of this patient's care.  Parley Pidcock D 02/01/2024 5:57 AM

## 2024-02-01 NOTE — Plan of Care (Signed)
  Problem: Clinical Measurements: Goal: Diagnostic test results will improve Outcome: Not Progressing Goal: Signs and symptoms of infection will decrease Outcome: Not Progressing   Problem: Respiratory: Goal: Ability to maintain adequate ventilation will improve Outcome: Not Progressing   Problem: Education: Goal: Knowledge of General Education information will improve Description: Including pain rating scale, medication(s)/side effects and non-pharmacologic comfort measures Outcome: Not Progressing   Problem: Clinical Measurements: Goal: Ability to maintain clinical measurements within normal limits will improve Outcome: Not Progressing Goal: Will remain free from infection Outcome: Not Progressing   Problem: Activity: Goal: Risk for activity intolerance will decrease Outcome: Not Progressing

## 2024-02-01 NOTE — Plan of Care (Signed)
  Problem: Clinical Measurements: Goal: Signs and symptoms of infection will decrease Outcome: Progressing   Problem: Respiratory: Goal: Ability to maintain adequate ventilation will improve Outcome: Progressing   

## 2024-02-01 NOTE — Progress Notes (Signed)
The patient is  on HFNC at 15 L plus NRB mask at 15 L and just stated desating into the 70 s while I was on my break. RT was notified  by Gena Fray and she is in the room  transitioning her  to heated HF. Jon Billings NP notified via secured chat  that she's being transitioned to heated HF.

## 2024-02-01 NOTE — Progress Notes (Signed)
Kathryn Morales extubated per Dr. Karna Christmas order. Patinet extubated to HFNC 12L. Kathryn Morales's oxygen saturation is 97%. No issues with extubation.

## 2024-02-01 NOTE — Progress Notes (Addendum)
   02/01/24 0438  Assess: MEWS Score  Temp (!) 106.2 F (41.2 C)  BP (!) 139/59  MAP (mmHg) 79  Pulse Rate 95  ECG Heart Rate 96  Resp (!) 45  Level of Consciousness Alert  SpO2 (!) 81 %  O2 Device HHFNC  Patient Activity (if Appropriate) In bed  O2 Flow Rate (L/min) 60 L/min  FiO2 (%) 100 %  Assess: MEWS Score  MEWS Temp 2  MEWS Systolic 0  MEWS Pulse 0  MEWS RR 3  MEWS LOC 0  MEWS Score 5  MEWS Score Color Red  Assess: if the MEWS score is Yellow or Red  Were vital signs accurate and taken at a resting state? Yes  Does the patient meet 2 or more of the SIRS criteria? Yes  Does the patient have a confirmed or suspected source of infection? Yes  MEWS guidelines implemented  Yes, red  Treat  MEWS Interventions Considered administering scheduled or prn medications/treatments as ordered  Take Vital Signs  Increase Vital Sign Frequency  Red: Q1hr x2, continue Q4hrs until patient remains green for 12hrs  Escalate  MEWS: Escalate Red: Discuss with charge nurse and notify provider. Consider notifying RRT. If remains red for 2 hours consider need for higher level of care  Notify: Charge Nurse/RN  Name of Charge Nurse/RN Notified Alex RN  Provider Notification  Provider Name/Title Jon Billings NP  Date Provider Notified 02/01/24  Time Provider Notified [phone] (She's been at the bedside for a while)  Method of Notification Face-to-face  Notification Reason Change in status  Provider response At bedside  Date of Provider Response 02/01/24  Time of Provider Response 0520  Notify: Rapid Response  Name of Rapid Response RN Notified Alcario Drought RN  Date Rapid Response Notified 02/01/24  Time Rapid Response Notified 0400  Assess: SIRS CRITERIA  SIRS Temperature  1  SIRS Respirations  1  SIRS Pulse 1  SIRS WBC 0  SIRS Score Sum  3   See new multiple orders  from [place] NP in epic.

## 2024-02-01 NOTE — Progress Notes (Signed)
.         CROSS COVER NOTE  NAME: CHARLESTINE ROOKSTOOL MRN: [id number] DOB : 08/31/1946 ATTENDING PHYSICIAN: Alford Highland, MD    Date of Service   02/01/2024   HPI/Events of Note   Informed by R T patient had been on HFNC AND NRB all night to maintain oxygen saturations now only obtaing sats 70s with heated high flow   Interventions   Assessment/Plan:    02/01/2024    4:38 AM 02/01/2024    4:26 AM 02/01/2024    4:00 AM  Vitals with BMI  Systolic 139  128  Diastolic 59  60  Pulse 95 91 91   Rectal temp reported as 106.2 rectal/ 100.1 axillary rechec k rectal 103 Patient not opening eyes to name called. Appears as ill as my first assessment with her 1/31 - review of notes said she had improved from there Chest ray significantly worse IMPRESSION: Worsening aeration with a mix of airspace disease, volume loss, and pulmonary edema.    Transfer to stepdown Cooling blanket BIPAP Solumedrol 125 now then 40 q 12 Add pulmicort nebs Procal above 2 yesterday - repeat this am - antibiotics changed to vanc and zosyn Mrsa screen Stop IV fluids for now but hold giving lasix until fever improves - BNP 366.9 Chest physiotherapy reordered Good lung down position Pulm consult - bronch?  Daughter informed over phone change in condition need for BIPAP and transfer to stepdown  Donnie Mesa NP [facility] Cross Cover 7pm-7am - check amion for availability Pager [phone]

## 2024-02-01 NOTE — Procedures (Signed)
Endotracheal Intubation: Patient required placement of an artificial airway secondary to Respiratory Failure  Consent: Daughter Orpha Bur Oday  Hand washing performed prior to starting the procedure.   Medications administered for sedation prior to procedure:  Rocuronium 40 mg IV, Fentanyl 100 mcg IV.    A time out procedure was called and correct patient, name, & ID confirmed. Needed supplies and equipment were assembled and checked to include ETT, 10 ml syringe, Glidescope, Mac and Miller blades, suction, oxygen and bag mask valve, end tidal CO2 monitor.   Patient was positioned to align the mouth and pharynx to facilitate visualization of the glottis.   Heart rate, SpO2 and blood pressure was continuously monitored during the procedure. Pre-oxygenation was conducted prior to intubation and endotracheal tube was placed through the vocal cords into the trachea.     The artificial airway was placed under direct visualization via glidescope route using a 8.0 ETT on the first attempt.  ETT was secured at 23 cm mark.  Placement was confirmed by auscuitation of lungs with good breath sounds bilaterally and no stomach sounds.  Condensation was noted on endotracheal tube.   Pulse ox 98%.  CO2 detector in place with appropriate color change.   Complications: None .     Chest radiograph ordered and pending.      Vida Rigger, M.D.  Pulmonary & Critical Care Medicine  [facility]

## 2024-02-01 NOTE — Progress Notes (Signed)
She's now sating in the upper 80 s.

## 2024-02-01 NOTE — Assessment & Plan Note (Signed)
Elevation in CK.  Continue to trend.

## 2024-02-01 NOTE — Progress Notes (Signed)
Progress Note   Patient: Kathryn Morales ZOX:[id number] DOB: 05-03-1946 DOA: 01/29/2024     3 DOS: the patient was seen and examined on 02/01/2024   Brief hospital course: 78 year old female past medical history of dementia, hypertension.  She presents to the hospital with fever, hypoxia and acute metabolic encephalopathy.  She has worsening fatigue and malaise over the past 1 to 2 days found to be leaning in her wheelchair to the left side not responding normally.  Patient had a fever of 103 in the emergency room.  Lumbar puncture was negative.  Patient started empirically on antibiotics.  CT scan of the chest shows atelectasis and occlusion of the left lower lobe.  1/31.  Antibiotics changed order Unasyn and Zithromax.  Patient not alert enough this morning to be placed on a diet. 2/1.  Patient's mental status better than yesterday.  Able to be placed back on a diet.  Patient does have a history of dementia.  Will restart oral medications.  May need to be placed on midodrine for hypotension and given fluid boluses. 2/2.  Transfer to the ICU secondary to increasing oxygen requirements.  This morning on 100% oxygen on the BiPAP.  1 dose of Lasix given with x-ray showing worsening aeration with a mix of airspace disease, volume loss and pulmonary edema.  Case discussed with critical care specialist and then patient required to go on Levophed so critical care team will take over.  Assessment and Plan: * Septic shock (HCC) Acute respiratory failure with hypoxia  SIRS criteria of fever and tachycardia With chest x-ray showing worsening pneumonia antibiotics switched over to Zosyn. Levophed started today so critical care team will take over case.   Hypotension Now with septic shock.  Patient started on Levophed.  Acute respiratory failure with hypoxia (HCC) Patient with worsening respiratory status overnight and this morning was on 100% oxygen with BiPAP.  1 dose of Lasix given just in case fluid  overload with all the fluid boluses yesterday.  Acute metabolic encephalopathy Patient has underlying dementia and fever.  Patient awake this morning and answered a few questions but difficult to understand.  Case discussed with pharmacist and less likely NMS but we will hold her psychiatric medications.  Atelectasis Nebulizer treatments.  Critical care team to talk to family about next steps.  Dementia without behavioral disturbance (HCC) Hold her usual medications  Rhabdomyolysis Elevation in CK.  Continue to trend.  Myocardial injury Secondary to demand ischemia with acute respiratory failure requiring BiPAP.        Subjective: Patient able to answer a couple questions but difficult to understand.  Easily awakened.  Patient less stiff with moving her arms and legs.  Admitted with altered mental status and clinical sepsis.  Physical Exam: Vitals:   02/01/24 0835 02/01/24 0837 02/01/24 0957 02/01/24 1045  BP: (!) 80/42 (!) 90/51  109/60  Pulse: (!) 57 65  66  Resp: 17 18  (!) 24  Temp:    98.1 F (36.7 C)  TempSrc:    Axillary  SpO2: 90% (!) 89% 98% 100%  Weight:      Height:       Physical Exam HENT:     Head: Normocephalic.  Eyes:     General: Lids are normal.     Conjunctiva/sclera: Conjunctivae normal.  Cardiovascular:     Rate and Rhythm: Normal rate and regular rhythm.     Heart sounds: Normal heart sounds, S1 normal and S2 normal.  Pulmonary:  Breath sounds: Examination of the right-middle field reveals decreased breath sounds. Examination of the left-middle field reveals decreased breath sounds and rhonchi. Examination of the right-lower field reveals decreased breath sounds and rhonchi. Examination of the left-lower field reveals decreased breath sounds and rhonchi. Decreased breath sounds and rhonchi present. No wheezing or rales.  Abdominal:     Palpations: Abdomen is soft.     Tenderness: There is no abdominal tenderness.  Musculoskeletal:     Right  lower leg: Swelling present.     Left lower leg: Swelling present.  Skin:    General: Skin is warm.     Findings: No rash.  Neurological:     Mental Status: She is alert.     Comments: Easier to move her arms and legs today as opposed to yesterday.     Data Reviewed: Chest x-ray showing worsening aeration with a mix of airspace disease, volume loss and pulmonary edema Creatinine 0.83, AST 70, BNP 366.9, troponin 89, procalcitonin down to 1.59, white blood count 5.9, hemoglobin 11.8, platelet count 135  Family Communication: Spoke with daughter on the phone  Disposition: Status is: Inpatient Remains inpatient appropriate because: Transferred to ICU for worsening respiratory status on 100% BiPAP.  Patient is a DNR.  Patient later required Levophed.  Critical care team to take over case today  Planned Discharge Destination: To be determined    Time spent: 30 minutes Case discussed with critical care team and nursing staff.  Author: Alford Highland, MD 02/01/2024 11:24 AM  For on call review [email or web address].

## 2024-02-01 NOTE — Progress Notes (Signed)
The patient is still in the upper 70s after she's transitioned to the heated HF. Jon Billings NP notified via page and she called back with an order to transfer the patient to step down for BIPAP. Jon Billings NP said she was going to call her daughter to  let her know what is going on. Will continue to monitor.

## 2024-02-02 DIAGNOSIS — A419 Sepsis, unspecified organism: Secondary | ICD-10-CM | POA: Diagnosis not present

## 2024-02-02 DIAGNOSIS — J189 Pneumonia, unspecified organism: Secondary | ICD-10-CM | POA: Diagnosis not present

## 2024-02-02 DIAGNOSIS — D696 Thrombocytopenia, unspecified: Secondary | ICD-10-CM

## 2024-02-02 DIAGNOSIS — J9601 Acute respiratory failure with hypoxia: Secondary | ICD-10-CM | POA: Diagnosis not present

## 2024-02-02 DIAGNOSIS — R4182 Altered mental status, unspecified: Secondary | ICD-10-CM

## 2024-02-02 LAB — BASIC METABOLIC PANEL
Anion gap: 9 (ref 5–15)
BUN: 24 mg/dL — ABNORMAL HIGH (ref 8–23)
CO2: 32 mmol/L (ref 22–32)
Calcium: 8 mg/dL — ABNORMAL LOW (ref 8.9–10.3)
Chloride: 99 mmol/L (ref 98–111)
Creatinine, Ser: 0.83 mg/dL (ref 0.44–1.00)
GFR, Estimated: >60 mL/min (ref >60)
Glucose, Bld: 146 mg/dL — ABNORMAL HIGH (ref 70–99)
Potassium: 3.4 mmol/L — ABNORMAL LOW (ref 3.5–5.1)
Sodium: 140 mmol/L (ref 135–145)

## 2024-02-02 LAB — CBC
HCT: 31.3 % — ABNORMAL LOW (ref 36.0–46.0)
Hemoglobin: 10.6 g/dL — ABNORMAL LOW (ref 12.0–15.0)
MCH: 32.4 pg (ref 26.0–34.0)
MCHC: 33.9 g/dL (ref 30.0–36.0)
MCV: 95.7 fL (ref 80.0–100.0)
Platelets: 135 10*3/uL — ABNORMAL LOW (ref 150–400)
RBC: 3.27 MIL/uL — ABNORMAL LOW (ref 3.87–5.11)
RDW: 12.8 % (ref 11.5–15.5)
WBC: 6 10*3/uL (ref 4.0–10.5)
nRBC: 0 % (ref 0.0–0.2)

## 2024-02-02 LAB — GLUCOSE, CAPILLARY
Glucose-Capillary: 101 mg/dL — ABNORMAL HIGH (ref 70–99)
Glucose-Capillary: 99 mg/dL (ref 70–99)
Glucose-Capillary: 154 mg/dL — ABNORMAL HIGH (ref 70–99)
Glucose-Capillary: 126 mg/dL — ABNORMAL HIGH (ref 70–99)
Glucose-Capillary: 133 mg/dL — ABNORMAL HIGH (ref 70–99)
Glucose-Capillary: 125 mg/dL — ABNORMAL HIGH (ref 70–99)
Glucose-Capillary: 128 mg/dL — ABNORMAL HIGH (ref 70–99)
Glucose-Capillary: 162 mg/dL — ABNORMAL HIGH (ref 70–99)

## 2024-02-02 LAB — PHOSPHORUS
Phosphorus: 3.1 mg/dL (ref 2.5–4.6)
Phosphorus: 3.4 mg/dL (ref 2.5–4.6)

## 2024-02-02 LAB — MAGNESIUM: Magnesium: 2.1 mg/dL (ref 1.7–2.4)

## 2024-02-02 LAB — C-REACTIVE PROTEIN: CRP: 12.3 mg/dL — ABNORMAL HIGH (ref <1.0)

## 2024-02-02 MED ORDER — HYDROCORTISONE SOD SUC (PF) 100 MG IJ SOLR
50.0000 mg | Freq: Four times a day (QID) | INTRAMUSCULAR | Status: DC
  Administered 2024-02-02 – 2024-02-03 (×4): 50 mg via INTRAVENOUS
  Filled 2024-02-02 (×4): qty 2

## 2024-02-02 MED ORDER — ENOXAPARIN SODIUM 40 MG/0.4ML IJ SOSY
40.0000 mg | PREFILLED_SYRINGE | Freq: Every day | INTRAMUSCULAR | Status: DC
  Administered 2024-02-02 – 2024-02-08 (×7): 40 mg via SUBCUTANEOUS
  Filled 2024-02-02 (×7): qty 0.4

## 2024-02-02 MED ORDER — ADULT MULTIVITAMIN W/MINERALS CH
1.0000 | ORAL_TABLET | Freq: Every day | ORAL | Status: DC
  Administered 2024-02-03 – 2024-02-09 (×6): 1 via ORAL
  Filled 2024-02-02 (×6): qty 1

## 2024-02-02 MED ORDER — ENSURE ENLIVE PO LIQD
237.0000 mL | Freq: Two times a day (BID) | ORAL | Status: DC
  Administered 2024-02-04 – 2024-02-09 (×9): 237 mL via ORAL

## 2024-02-02 MED ORDER — SODIUM CHLORIDE 3 % IN NEBU
4.0000 mL | INHALATION_SOLUTION | Freq: Two times a day (BID) | RESPIRATORY_TRACT | Status: AC
  Administered 2024-02-02 – 2024-02-04 (×5): 4 mL via RESPIRATORY_TRACT
  Filled 2024-02-02 (×6): qty 4

## 2024-02-02 MED ORDER — POTASSIUM CHLORIDE 10 MEQ/100ML IV SOLN
10.0000 meq | INTRAVENOUS | Status: AC
  Administered 2024-02-02 (×3): 10 meq via INTRAVENOUS
  Filled 2024-02-02 (×3): qty 100

## 2024-02-02 MED ORDER — ORAL CARE MOUTH RINSE: 15.0000 mL | OROMUCOSAL | Status: DC | PRN

## 2024-02-02 MED ORDER — LACTATED RINGERS IV BOLUS
500.0000 mL | Freq: Once | INTRAVENOUS | Status: AC
  Administered 2024-02-02: 500 mL via INTRAVENOUS

## 2024-02-02 NOTE — Progress Notes (Addendum)
Initial Nutrition Assessment  DOCUMENTATION CODES:   Not applicable  INTERVENTION:   Ensure Enlive po BID, each supplement provides 350 kcal and 20 grams of protein.  Magic cup TID with meals, each supplement provides 290 kcal and 9 grams of protein  MVI po daily   Pt at high refeed risk; recommend monitor potassium, magnesium and phosphorus labs daily until stable  Assist with meals   Daily weights   NUTRITION DIAGNOSIS:   Inadequate oral intake related to acute illness as evidenced by NPO status.  GOAL:   Patient will meet greater than or equal to 90% of their needs  MONITOR:   Diet advancement, Labs, Weight trends, I & O's, Skin  REASON FOR ASSESSMENT:   Consult Enteral/tube feeding initiation and management  ASSESSMENT:   78 y/o female with h/o HTN, HLD. anxiety and advanced dementia who is admitted with PNA, sepsis, rhabdomyolysis, AMS and MI.  Visited pt's room today. Pt is unable to provide any history. Pt's son at bedside reports pt with poor appetite and oral intake at baseline; son reports that patient requires meal assist as she is only able to eat finger foods without assistance. Pt does drink Ensure at baseline. Son reports that patient has only been eating bites since Saturday. Pt seen by SLP today and initiated on a dysphagia 2/thin liquid diet. RD will add supplements and MVI to help pt meet her estimated needs. Pt is at high refeed risk. Per chart, pt appears fairly weight stable at baseline.    Medications reviewed and include: lovenox, solu-cortef, zosyn  Labs reviewed: K 3.4(L), BUN 24(H), P 3.4 wnl, Mg 2.1 wnl Hgb 10.6(L), Hct 31.3(L) Cbgs- 125, 133, 126 x 24 hrs   NUTRITION - FOCUSED PHYSICAL EXAM:  Flowsheet Row Most Recent Value  Orbital Region No depletion  Upper Arm Region No depletion  Thoracic and Lumbar Region No depletion  Buccal Region No depletion  Temple Region Mild depletion  Clavicle Bone Region Moderate depletion  Clavicle  and Acromion Bone Region Moderate depletion  Scapular Bone Region No depletion  Dorsal Hand Mild depletion  Patellar Region No depletion  Anterior Thigh Region No depletion  Posterior Calf Region No depletion  Edema (RD Assessment) None  Hair Reviewed  Eyes Reviewed  Mouth Reviewed  Skin Reviewed  Nails Reviewed   Diet Order:   Diet Order             DIET DYS 2 Fluid consistency: Thin  Diet effective now                  EDUCATION NEEDS:   No education needs have been identified at this time  Skin:  Skin Assessment: Reviewed RN Assessment  Last BM:  2/2- type 6  Height:   Ht Readings from Last 1 Encounters:  01/29/24 5\' 2"  (1.575 m)    Weight:   Wt Readings from Last 1 Encounters:  02/02/24 55.5 kg    Ideal Body Weight:  50 kg  BMI:  Body mass index is 22.38 kg/m.  Estimated Nutritional Needs:   Kcal:  1400-1600kcal/day  Protein:  70-80g/day  Fluid:  1.2-1.4L/day  Betsey Holiday MS, RD, LDN If unable to be reached, please send secure chat to "RD inpatient" available from 8:00a-4:00p daily

## 2024-02-02 NOTE — Progress Notes (Signed)
Received report from the outgoing RN. Patient is on 15L HFNC and 15 L NRB on top of it and she's sating 93%. Will continue to monitor.

## 2024-02-02 NOTE — Progress Notes (Signed)
   02/02/24 1100  Spiritual Encounters  Type of Visit Follow up  Care provided to: Pt and family (Son at bedside)  Orlene Erm partners present during encounter Nurse;Physician  Referral source Chaplain assessment  Reason for visit Routine spiritual support  OnCall Visit Yes  Spiritual Framework  Presenting Themes Other (comment) (Pt unable to communicate)  Family Stress Factors Other (Comment) (Concern for Mother)  Interventions  Spiritual Care Interventions Made Established relationship of care and support;Compassionate presence;Reflective listening;Prayer;Encouragement  Intervention Outcomes  Outcomes Connection to spiritual care;Awareness around self/spiritual resourses;Awareness of support;Other (comment);Connected to spiritual community ((Family))

## 2024-02-02 NOTE — Progress Notes (Addendum)
Reassessed swallow through Dysphagia treatment. Report to follow. No s/s of aspiration.Will order Dys 2 with thin liquids. ST to follow and adjust diet as indicated.

## 2024-02-02 NOTE — Progress Notes (Signed)
Speech Language Pathology Treatment: Dysphagia  Patient Details Name: Kathryn Morales MRN: [id number] DOB: [birth year] Today's Date: 02/02/2024 Time: 1430-1520 SLP Time Calculation (min) (ACUTE ONLY): 50 min  Assessment / Plan / Recommendation Clinical Impression  Pt seen for reassessment of swallowing through Dysphagia treatment session. Bedside swallow eval Saturday revealed swallowing WFL. Pt has since had a decline in status with mucous plug requiring bronchoscopy. Reassessment today to determine if Pt is able to take Po's. Pt currently has HFNC. Pt was alert and confused but able and willing to take PO's from SLP. Pt tolerated thin liquids and purees without overt s/s of aspiration and minimal oral difficulty. Mild oral residue after solid consistencies. Rec starting a Dys 2 diet for ease of chewing. May have thin liquids small sips, meds crushed in applesauce. St to follow up with toleration of diet and advance to Dys 3 when ready and requiring less oxygen.   HPI HPI: Pr MD progress note, "78 year old female past medical history of dementia, hypertension. She presents to the hospital with fever, hypoxia and acute metabolic encephalopathy. She has worsening fatigue and malaise over the past 1 to 2 days found to be leaning in her wheelchair to the left side not responding normally. Patient had a fever of 103 in the emergency room. Lumbar puncture was negative. Patient started empirically on antibiotics." CT scan of the chest shows "complete atelectasis/collapse of the left lower lobe. Left lower lobe bronchus is completely occluded. Findings favor mucous plugging and postobstructive atelectasis. Follow-up to resolution is recommended as an obstructing bronchial mass is not entirely excluded." Head CT 1/30: No acute intracranial abnormality. Chronic cerebral volume loss and white matter changes. Pt currently on 5L high flow nasal canula and NPO.      SLP Plan  Continue with current plan of care       Recommendations for follow up therapy are one component of a multi-disciplinary discharge planning process, led by the attending physician.  Recommendations may be updated based on patient status, additional functional criteria and insurance authorization.    Recommendations  Diet recommendations: Dysphagia 2 (fine chop);Thin liquid Liquids provided via: Cup;Straw Medication Administration: Crushed with puree Supervision: Full supervision/cueing for compensatory strategies Compensations: Minimize environmental distractions;Slow rate;Small sips/bites Postural Changes and/or Swallow Maneuvers: Seated upright 90 degrees;Upright 30-60 min after meal                        Dysphagia, unspecified (R13.10)     Continue with current plan of care     Eather Colas  02/02/2024, 4:03 PM

## 2024-02-02 NOTE — Plan of Care (Signed)
  Problem: Fluid Volume: Goal: Hemodynamic stability will improve Outcome: Progressing   Problem: Clinical Measurements: Goal: Diagnostic test results will improve Outcome: Progressing Goal: Signs and symptoms of infection will decrease Outcome: Progressing   Problem: Respiratory: Goal: Ability to maintain adequate ventilation will improve Outcome: Progressing   Problem: Education: Goal: Knowledge of General Education information will improve Description: Including pain rating scale, medication(s)/side effects and non-pharmacologic comfort measures Outcome: Progressing   Problem: Clinical Measurements: Goal: Ability to maintain clinical measurements within normal limits will improve Outcome: Progressing Goal: Will remain free from infection Outcome: Progressing Goal: Diagnostic test results will improve Outcome: Progressing Goal: Respiratory complications will improve Outcome: Progressing Goal: Cardiovascular complication will be avoided Outcome: Progressing   Problem: Activity: Goal: Risk for activity intolerance will decrease Outcome: Progressing   Problem: Nutrition: Goal: Adequate nutrition will be maintained Outcome: Not Progressing   Problem: Coping: Goal: Level of anxiety will decrease Outcome: Progressing   Problem: Elimination: Goal: Will not experience complications related to bowel motility Outcome: Progressing Goal: Will not experience complications related to urinary retention Outcome: Progressing   Problem: Pain Managment: Goal: General experience of comfort will improve and/or be controlled Outcome: Progressing

## 2024-02-02 NOTE — Progress Notes (Signed)
NAME:  Kathryn Morales, MRN:  [id number], DOB:  1946-12-12, LOS: 4 ADMISSION DATE:  01/29/2024, CONSULTATION DATE:  02/01/2024 REFERRING MD:  Dr. Renae Gloss, CHIEF COMPLAINT:  Hypotension, worsening hypoxia   Brief Pt Description / Synopsis:  78 y.o female, DNR/DNI, with PMHx significant for dementia admitted with Acute Metabolic Encephalopathy and Acute Hypoxic Respiratory Failure in the setting of suspected pneumonia.  Course complicated by severe mucus plugging with complete atelectasis requiring Bronchoscopy and development of septic shock.  History of Present Illness:  This is a 78 yo F with hx of HTN, dementia who came in with acute hypoxemic respiratory failure.  I was unable to get history from patient due to severe hypoxemia on BIPAP and met with her son Kathryn Morales and daughter Kathryn Morales.  They explain that she had outpatient treatment with abx and failure of therapy for pneumonia with 2 abx courses over past 2 weeks.  The son and daughter are medical POA for patient.  We reviewed her CT scan with findings of complete atelectasis/collapse of the left lower lobe. Left lower lobe bronchus is completely occluded. Her blood work reveals chronic thrombocytopenia with platelets at 127 appx 1 year ago. She required 80% FiO2 on bipap and had labored breathing.  We discussed code status at length. There was discussion regarding intubation and possible bronchoscopy due to CT chest findings with goal of unplugging of left lung with therapeutic aspiration.  Family wanted to discuss among themselves and share that they wish to have full scope of therapy but keep DNR status.   Please see "Significant Hospital Events" section below for full detailed hospital course.   Pertinent  Medical History   Past Medical History:  Diagnosis Date   Anxiety    Hyperlipidemia    Hypertension     Micro Data:  1/30: SARS-CoV-2/Flu/RSV PCR>>negative 1/30: Blood culture>> no growth to date 1/30:  Meningitis/Encephalitis panel CSF>> negative 2/2: MRSA PCR>> negative 2/2: BAL>>  Antimicrobials:   Anti-infectives (From admission, onward)    Start     Dose/Rate Route Frequency Ordered Stop   02/02/24 0700  vancomycin (VANCOCIN) IVPB 1000 mg/200 mL premix  Status:  Discontinued        1,000 mg 200 mL/hr over 60 Minutes Intravenous Every 24 hours 02/01/24 0557 02/01/24 1102   02/01/24 1000  doxycycline (VIBRA-TABS) tablet 100 mg  Status:  Discontinued        100 mg Oral Every 12 hours 01/31/24 1315 02/01/24 0439   02/01/24 1000  doxycycline (VIBRA-TABS) tablet 100 mg  Status:  Discontinued        100 mg Oral Every 12 hours 02/01/24 0435 02/01/24 0439   02/01/24 0700  piperacillin-tazobactam (ZOSYN) IVPB 3.375 g        3.375 g 12.5 mL/hr over 240 Minutes Intravenous Every 8 hours 02/01/24 0600     02/01/24 0600  ceFEPIme (MAXIPIME) 2 g in sodium chloride 0.9 % 100 mL IVPB  Status:  Discontinued        2 g 200 mL/hr over 30 Minutes Intravenous Every 12 hours 02/01/24 0512 02/01/24 0559   02/01/24 0600  vancomycin (VANCOREADY) IVPB 1500 mg/300 mL        1,500 mg 150 mL/hr over 120 Minutes Intravenous  Once 02/01/24 0512 02/01/24 0818   02/01/24 0530  doxycycline (VIBRAMYCIN) 100 mg in sodium chloride 0.9 % 250 mL IVPB  Status:  Discontinued        100 mg 125 mL/hr over 120 Minutes Intravenous Every  12 hours 02/01/24 0439 02/01/24 0500   01/31/24 1000  azithromycin (ZITHROMAX) tablet 250 mg  Status:  Discontinued       Placed in "Followed by" Linked Group   250 mg Oral Daily 01/30/24 0716 01/30/24 0855   01/30/24 1800  vancomycin (VANCOCIN) IVPB 750 mg/150 ml premix  Status:  Discontinued        750 mg 150 mL/hr over 60 Minutes Intravenous Every 24 hours 01/29/24 1711 01/30/24 0949   01/30/24 1800  vancomycin (VANCOCIN) IVPB 1000 mg/200 mL premix  Status:  Discontinued        1,000 mg 200 mL/hr over 60 Minutes Intravenous Every 24 hours 01/30/24 0950 01/30/24 0953   01/30/24 1800   Ampicillin-Sulbactam (UNASYN) 3 g in sodium chloride 0.9 % 100 mL IVPB  Status:  Discontinued        3 g 200 mL/hr over 30 Minutes Intravenous Every 6 hours 01/30/24 0953 02/01/24 0508   01/30/24 1000  azithromycin (ZITHROMAX) tablet 500 mg  Status:  Discontinued       Placed in "Followed by" Linked Group   500 mg Oral Daily 01/30/24 0716 01/30/24 0855   01/30/24 1000  azithromycin (ZITHROMAX) 500 mg in sodium chloride 0.9 % 250 mL IVPB  Status:  Discontinued        500 mg 250 mL/hr over 60 Minutes Intravenous Every 24 hours 01/30/24 0855 01/31/24 1315   01/29/24 2200  metroNIDAZOLE (FLAGYL) IVPB 500 mg  Status:  Discontinued        500 mg 100 mL/hr over 60 Minutes Intravenous Every 12 hours 01/29/24 1645 01/30/24 0717   01/29/24 2200  ceFEPIme (MAXIPIME) 2 g in sodium chloride 0.9 % 100 mL IVPB  Status:  Discontinued        2 g 200 mL/hr over 30 Minutes Intravenous Every 12 hours 01/29/24 1702 01/30/24 0953   01/29/24 1800  vancomycin (VANCOREADY) IVPB 500 mg/100 mL        500 mg 100 mL/hr over 60 Minutes Intravenous  Once 01/29/24 1704 01/29/24 1903   01/29/24 1000  ceFEPIme (MAXIPIME) 2 g in sodium chloride 0.9 % 100 mL IVPB        2 g 200 mL/hr over 30 Minutes Intravenous  Once 01/29/24 0959 01/29/24 1059   01/29/24 1000  metroNIDAZOLE (FLAGYL) IVPB 500 mg        500 mg 100 mL/hr over 60 Minutes Intravenous  Once 01/29/24 0959 01/29/24 1207   01/29/24 1000  vancomycin (VANCOCIN) IVPB 1000 mg/200 mL premix        1,000 mg 200 mL/hr over 60 Minutes Intravenous  Once 01/29/24 0959 01/29/24 1356       Significant Hospital Events: Including procedures, antibiotic start and stop dates in addition to other pertinent events   1/31.  Antibiotics changed order Unasyn and Zithromax.  Patient not alert enough this morning to be placed on a diet. 2/1.  Patient's mental status better than yesterday.  Able to be placed back on a diet.  Patient does have a history of dementia.  Will restart  oral medications. 2/2. Transfer to the ICU secondary to increasing oxygen requirements. This morning on 100% oxygen on the BiPAP. 1 dose of Lasix given with x-ray showing worsening aeration with a mix of airspace disease, volume loss and pulmonary edema. Case discussed with critical care specialist and then patient required to go on Levophed so critical care team will take over. Intubated and underwent Bronchoscopy, extubated post procedure. 2/3: Weaned off  vasopressors.  Weaned from BiPAP to [facility].  Speech evaluation pending, may need NGT placement if fails swallow evaluation. BAL cultures pending.  Interim History / Subjective:  As outlined above in significant hospital events  Objective   Blood pressure (!) 104/57, pulse 60, temperature 98.1 F (36.7 C), temperature source Axillary, resp. rate (!) 28, height 5\' 2"  (1.575 m), weight 55.5 kg, SpO2 93%.    Vent Mode: PSV FiO2 (%):  [60 %-100 %] 60 % Set Rate:  [15 bmp] 15 bmp Vt Set:  [400 mL] 400 mL PEEP:  [5 cmH20] 5 cmH20 Pressure Support:  [5 cmH20] 5 cmH20   Intake/Output Summary (Last 24 hours) at 02/02/2024 0843 Last data filed at 02/02/2024 0700 Gross per 24 hour  Intake 318.55 ml  Output 900 ml  Net -581.45 ml   Filed Weights   01/29/24 0955 02/02/24 0630  Weight: 61.2 kg 55.5 kg    Examination: General: Acute on chronically ill appearing female, laying in bed, on HFNC, in NAD HENT: Atraumatic, normocephalic, neck supple, no JVD Lungs: Clear breath sounds throughout, even, nonlabored, normal effort Cardiovascular: RRR, s1s2, no M/R/G Abdomen: Soft, nontender, nondistended, no guarding or rebound tenderness, BS +x4 Extremities: Normal bulk and tone, no deformities, no edema, no cyanosis Neuro: Awake and alert, disoriented x4 (severe dementia at baseline), intermittently follows commands, no focal deficits noted, pupils PERRL GU: External female catheter in place  Resolved Hospital Problem list   Septic Shock  Assessment  & Plan:   #Acute Hypoxic Respiratory Failure in the setting of Pneumonia and mucus plugging with atelectasis EXTUBATED 2/2 S/P BRONCHOSCOPY 2/2 -Supplemental O2 as needed to maintain O2 sats >92% -BiPAP, wean as tolerated (pt is DNR/DNI) -Follow intermittent Chest X-ray & ABG as needed -Bronchodilators & Pulmicort nebs -IV Steroids -ABX as above -Diuresis as BP and renal function permits -Pulmonary toilet as able  #Septic Shock ~ RESOLVED #Elevated Troponin, suspect demand ischemia -Continuous cardiac monitoring -Maintain MAP >65 -Cautious IV fluids -Vasopressors as needed to maintain MAP goal ~ weaned off -Lactic acid is normalized -HS Troponin peaked at 89 -Diuresis as BP and renal function permits  #Severe Sepsis (Met SIRS Criteria: fever and tachycardia) #Pneumonia -Monitor fever curve -Trend WBC's & Procalcitonin -Follow cultures as above -Continue empiric Zosyn pending cultures & sensitivities  #Mild Hypokalemia -Monitor I&O's / urinary output -Follow BMP -Ensure adequate renal perfusion -Avoid nephrotoxic agents as able -Replace electrolytes as indicated ~ Pharmacy following for assistance with electrolyte replacement  #Mild Thrombocytopenia -Monitor for S/Sx of bleeding -Trend CBC -Heparin SQ for VTE Prophylaxis  -Transfuse for Hgb <7  #Acute Metabolic Encephalopathy #Dementia -Treatment of metabolic derangements as outlined above -Provide supportive care -Promote normal sleep/wake cycle and family presence -Avoid sedating medications as able -Continue Aricept     Best Practice (right click and "Reselect all SmartList Selections" daily)   Diet/type: NPO, pending speech evaluation DVT prophylaxis: prophylactic heparin  GI prophylaxis: N/A Lines: N/A Foley:  N/A Code Status:  DNR Last date of multidisciplinary goals of care discussion [2/3]  2/3: Pt's son updated at bedside on plan of care.  He confirms DNR/DNI status, and reports she was not  want a feeding tube place.  Labs   CBC: Recent Labs  Lab 01/29/24 1004 01/30/24 0437 01/31/24 0342 02/01/24 0459 02/02/24 0334  WBC 6.5 8.5 6.5 5.9 6.0  NEUTROABS 4.7  --   --  4.6  --   HGB 14.0 12.8 11.3* 11.8* 10.6*  HCT 42.3 38.5 33.2* 35.9*  31.3*  MCV 96.8 97.5 95.1 98.4 95.7  PLT 161 124* 113* 135* 135*    Basic Metabolic Panel: Recent Labs  Lab 01/29/24 0946 01/30/24 0437 01/31/24 0342 02/01/24 0459 02/02/24 0334  NA 140 136 140 144 140  K 4.2 3.5 3.9 4.2 3.4*  CL 100 99 100 101 99  CO2 30 28 30 28  32  GLUCOSE 115* 109* 101* 105* 146*  BUN 22 13 15 19  24*  CREATININE 1.08* 0.79 0.79 0.83 0.83  CALCIUM 9.1 8.5* 8.3* 8.7* 8.0*  MG  --   --   --  1.8 2.1  PHOS  --   --   --   --  3.1   GFR: Estimated Creatinine Clearance: 44.9 mL/min (by C-G formula based on SCr of 0.83 mg/dL). Recent Labs  Lab 01/29/24 0959 01/29/24 1004 01/29/24 1212 01/29/24 1819 01/30/24 0437 01/31/24 0342 02/01/24 0459 02/02/24 0334  PROCALCITON  --   --   --  <0.10  --  2.03 1.59  --   WBC  --    < >  --   --  8.5 6.5 5.9 6.0  LATICACIDVEN 1.7  --  1.4  --   --   --   --   --    < > = values in this interval not displayed.    Liver Function Tests: Recent Labs  Lab 01/29/24 0946 01/30/24 0437 02/01/24 0459  AST 27 52* 70*  ALT 16 21 28   ALKPHOS 45 38 39  BILITOT 0.9 0.8 0.6  PROT 7.1 6.3* 5.9*  ALBUMIN 4.0 3.4* 2.8*   No results for input(s): "LIPASE", "AMYLASE" in the last 168 hours. No results for input(s): "AMMONIA" in the last 168 hours.  ABG    Component Value Date/Time   HCO3 35.1 (H) 01/29/2024 1007   O2SAT 36.4 01/29/2024 1007     Coagulation Profile: Recent Labs  Lab 01/29/24 0946 01/29/24 1004  INR 1.0 1.0    Cardiac Enzymes: Recent Labs  Lab 01/31/24 0341  CKTOTAL 1,709*    HbA1C: No results found for: "HGBA1C"  CBG: Recent Labs  Lab 02/01/24 1542 02/01/24 1951 02/01/24 2347 02/02/24 0420 02/02/24 0721  GLUCAP 185* 154* 140*  126* 133*    Review of Systems:   Unable to assess due to AMS   Past Medical History:  She,  has a past medical history of Anxiety, Hyperlipidemia, and Hypertension.   Surgical History:   Past Surgical History:  Procedure Laterality Date   BREAST SURGERY     TONSILLECTOMY AND ADENOIDECTOMY     TUBAL LIGATION       Social History:   reports that she has never smoked. She has never used smokeless tobacco. She reports current alcohol use. She reports that she does not use drugs.   Family History:  Her family history includes Anxiety disorder in her daughter; Asthma in her maternal grandfather; Breast cancer in her paternal grandmother; Coronary artery disease in her mother; Diabetes in her brother; Heart attack in her paternal grandfather; Heart disease in her mother; Hypothyroidism in her daughter and maternal grandmother; Lupus in her sister.   Allergies No Known Allergies   Home Medications  Prior to Admission medications   Medication Sig Start Date End Date Taking? Authorizing Provider  ARIPiprazole (ABILIFY) 2 MG tablet Take 1 tablet (2 mg total) by mouth at bedtime. Patient taking differently: Take 2 mg by mouth daily. 06/26/18  Yes Chrismon, Jodell Cipro, PA-C  cholecalciferol (VITAMIN D3) 25  MCG (1000 UNIT) tablet Take 2,000 Units by mouth daily.   Yes [provider]  divalproex (DEPAKOTE SPRINKLE) 125 MG capsule Take 125 mg by mouth 2 (two) times daily. 01/21/24  Yes [provider]  donepezil (ARICEPT) 10 MG tablet Take 1 tablet (10 mg total) by mouth at bedtime. Patient taking differently: Take 10 mg by mouth in the morning. 01/10/17  Yes Chrismon, Jodell Cipro, PA-C  hydrochlorothiazide (MICROZIDE) 12.5 MG capsule Take 12.5 mg by mouth daily. 01/21/24  Yes [provider]  melatonin 5 MG TABS Take 5 mg by mouth at bedtime.   Yes [provider]  sertraline (ZOLOFT) 50 MG tablet Take 75 mg by mouth daily. 01/21/24  Yes [provider]   simvastatin (ZOCOR) 40 MG tablet Take 1 tablet (40 mg total) by mouth at bedtime. 07/22/16  Yes Chrismon, Jodell Cipro, PA-C     Critical care time: 40 minutes     Harlon Ditty, AGACNP-BC [facility] Prefer epic messenger for cross cover needs If after hours, please call E-link

## 2024-02-03 ENCOUNTER — Encounter: Payer: Self-pay | Admitting: Family Medicine

## 2024-02-03 ENCOUNTER — Inpatient Hospital Stay: Payer: Medicare PPO

## 2024-02-03 ENCOUNTER — Other Ambulatory Visit: Payer: Self-pay

## 2024-02-03 DIAGNOSIS — R509 Fever, unspecified: Secondary | ICD-10-CM

## 2024-02-03 DIAGNOSIS — J9601 Acute respiratory failure with hypoxia: Secondary | ICD-10-CM | POA: Diagnosis not present

## 2024-02-03 DIAGNOSIS — G9341 Metabolic encephalopathy: Secondary | ICD-10-CM | POA: Diagnosis not present

## 2024-02-03 LAB — MAGNESIUM: Magnesium: 2.3 mg/dL (ref 1.7–2.4)

## 2024-02-03 LAB — CULTURE, BLOOD (ROUTINE X 2)
Culture: NO GROWTH
Culture: NO GROWTH
Special Requests: ADEQUATE

## 2024-02-03 LAB — CBC
HCT: 30.5 % — ABNORMAL LOW (ref 36.0–46.0)
Hemoglobin: 10.2 g/dL — ABNORMAL LOW (ref 12.0–15.0)
MCH: 32 pg (ref 26.0–34.0)
MCHC: 33.4 g/dL (ref 30.0–36.0)
MCV: 95.6 fL (ref 80.0–100.0)
Platelets: 149 10*3/uL — ABNORMAL LOW (ref 150–400)
RBC: 3.19 MIL/uL — ABNORMAL LOW (ref 3.87–5.11)
RDW: 12.9 % (ref 11.5–15.5)
WBC: 9.2 10*3/uL (ref 4.0–10.5)
nRBC: 0 % (ref 0.0–0.2)

## 2024-02-03 LAB — BASIC METABOLIC PANEL
Anion gap: 11 (ref 5–15)
BUN: 35 mg/dL — ABNORMAL HIGH (ref 8–23)
CO2: 30 mmol/L (ref 22–32)
Calcium: 8.3 mg/dL — ABNORMAL LOW (ref 8.9–10.3)
Chloride: 99 mmol/L (ref 98–111)
Creatinine, Ser: 0.96 mg/dL (ref 0.44–1.00)
GFR, Estimated: >60 mL/min (ref >60)
Glucose, Bld: 146 mg/dL — ABNORMAL HIGH (ref 70–99)
Potassium: 3.4 mmol/L — ABNORMAL LOW (ref 3.5–5.1)
Sodium: 140 mmol/L (ref 135–145)

## 2024-02-03 LAB — PHOSPHORUS: Phosphorus: 2.8 mg/dL (ref 2.5–4.6)

## 2024-02-03 LAB — GLUCOSE, CAPILLARY
Glucose-Capillary: 128 mg/dL — ABNORMAL HIGH (ref 70–99)
Glucose-Capillary: 130 mg/dL — ABNORMAL HIGH (ref 70–99)
Glucose-Capillary: 134 mg/dL — ABNORMAL HIGH (ref 70–99)
Glucose-Capillary: 146 mg/dL — ABNORMAL HIGH (ref 70–99)
Glucose-Capillary: 171 mg/dL — ABNORMAL HIGH (ref 70–99)
Glucose-Capillary: 183 mg/dL — ABNORMAL HIGH (ref 70–99)

## 2024-02-03 LAB — C-REACTIVE PROTEIN: CRP: 6 mg/dL — ABNORMAL HIGH (ref <1.0)

## 2024-02-03 MED ORDER — HYDROCORTISONE SOD SUC (PF) 100 MG IJ SOLR
50.0000 mg | Freq: Two times a day (BID) | INTRAMUSCULAR | Status: DC
  Administered 2024-02-03 – 2024-02-04 (×2): 50 mg via INTRAVENOUS
  Filled 2024-02-03 (×2): qty 2

## 2024-02-03 MED ORDER — LACTATED RINGERS IV SOLN
INTRAVENOUS | Status: DC
Start: 2024-02-03 — End: 2024-02-03
  Administered 2024-02-03: 75 mL/h via INTRAVENOUS

## 2024-02-03 NOTE — Progress Notes (Signed)
 NAME:  Kathryn Morales, MRN:  [id number], DOB:  May 11, 1946, LOS: 5 ADMISSION DATE:  01/29/2024, CONSULTATION DATE:  02/01/2024 REFERRING MD:  Dr. Josette, CHIEF COMPLAINT:  Hypotension, worsening hypoxia   Brief Pt Description / Synopsis:  78 y.o female, DNR/DNI, with PMHx significant for dementia admitted with Acute Metabolic Encephalopathy and Acute Hypoxic Respiratory Failure in the setting of suspected pneumonia.  Course complicated by severe mucus plugging with complete atelectasis requiring Bronchoscopy and development of septic shock.  History of Present Illness:  This is a 78 yo F with hx of HTN, dementia who came in with acute hypoxemic respiratory failure.  I was unable to get history from patient due to severe hypoxemia on BIPAP and met with her son Marabelle Cushman and daughter [name].  They explain that she had outpatient treatment with abx and failure of therapy for pneumonia with 2 abx courses over past 2 weeks.  The son and daughter are medical POA for patient.  We reviewed her CT scan with findings of complete atelectasis/collapse of the left lower lobe. Left lower lobe bronchus is completely occluded. Her blood work reveals chronic thrombocytopenia with platelets at 127 appx 1 year ago. She required 80% FiO2 on bipap and had labored breathing.  We discussed code status at length. There was discussion regarding intubation and possible bronchoscopy due to CT chest findings with goal of unplugging of left lung with therapeutic aspiration.  Family wanted to discuss among themselves and share that they wish to have full scope of therapy but keep DNR status.   Please see Significant Hospital Events section below for full detailed hospital course.   Pertinent  Medical History   Past Medical History:  Diagnosis Date   Anxiety    Hyperlipidemia    Hypertension     Micro Data:  1/30: SARS-CoV-2/Flu/RSV PCR>>negative 1/30: Blood culture>> no growth to date 1/30:  Meningitis/Encephalitis panel CSF>> negative 2/2: MRSA PCR>> negative 2/2: BAL>>  Antimicrobials:   Anti-infectives (From admission, onward)    Start     Dose/Rate Route Frequency Ordered Stop   02/02/24 0700  vancomycin  (VANCOCIN ) IVPB 1000 mg/200 mL premix  Status:  Discontinued        1,000 mg 200 mL/hr over 60 Minutes Intravenous Every 24 hours 02/01/24 0557 02/01/24 1102   02/01/24 1000  doxycycline  (VIBRA -TABS) tablet 100 mg  Status:  Discontinued        100 mg Oral Every 12 hours 01/31/24 1315 02/01/24 0439   02/01/24 1000  doxycycline  (VIBRA -TABS) tablet 100 mg  Status:  Discontinued        100 mg Oral Every 12 hours 02/01/24 0435 02/01/24 0439   02/01/24 0700  piperacillin -tazobactam (ZOSYN ) IVPB 3.375 g        3.375 g 12.5 mL/hr over 240 Minutes Intravenous Every 8 hours 02/01/24 0600     02/01/24 0600  ceFEPIme  (MAXIPIME ) 2 g in sodium chloride  0.9 % 100 mL IVPB  Status:  Discontinued        2 g 200 mL/hr over 30 Minutes Intravenous Every 12 hours 02/01/24 0512 02/01/24 0559   02/01/24 0600  vancomycin  (VANCOREADY) IVPB 1500 mg/300 mL        1,500 mg 150 mL/hr over 120 Minutes Intravenous  Once 02/01/24 0512 02/01/24 0818   02/01/24 0530  doxycycline  (VIBRAMYCIN ) 100 mg in sodium chloride  0.9 % 250 mL IVPB  Status:  Discontinued        100 mg 125 mL/hr over 120 Minutes Intravenous Every  12 hours 02/01/24 0439 02/01/24 0500   01/31/24 1000  azithromycin  (ZITHROMAX ) tablet 250 mg  Status:  Discontinued       Placed in Followed by Linked Group   250 mg Oral Daily 01/30/24 0716 01/30/24 0855   01/30/24 1800  vancomycin  (VANCOCIN ) IVPB 750 mg/150 ml premix  Status:  Discontinued        750 mg 150 mL/hr over 60 Minutes Intravenous Every 24 hours 01/29/24 1711 01/30/24 0949   01/30/24 1800  vancomycin  (VANCOCIN ) IVPB 1000 mg/200 mL premix  Status:  Discontinued        1,000 mg 200 mL/hr over 60 Minutes Intravenous Every 24 hours 01/30/24 0950 01/30/24 0953   01/30/24 1800   Ampicillin -Sulbactam (UNASYN ) 3 g in sodium chloride  0.9 % 100 mL IVPB  Status:  Discontinued        3 g 200 mL/hr over 30 Minutes Intravenous Every 6 hours 01/30/24 0953 02/01/24 0508   01/30/24 1000  azithromycin  (ZITHROMAX ) tablet 500 mg  Status:  Discontinued       Placed in Followed by Linked Group   500 mg Oral Daily 01/30/24 0716 01/30/24 0855   01/30/24 1000  azithromycin  (ZITHROMAX ) 500 mg in sodium chloride  0.9 % 250 mL IVPB  Status:  Discontinued        500 mg 250 mL/hr over 60 Minutes Intravenous Every 24 hours 01/30/24 0855 01/31/24 1315   01/29/24 2200  metroNIDAZOLE  (FLAGYL ) IVPB 500 mg  Status:  Discontinued        500 mg 100 mL/hr over 60 Minutes Intravenous Every 12 hours 01/29/24 1645 01/30/24 0717   01/29/24 2200  ceFEPIme  (MAXIPIME ) 2 g in sodium chloride  0.9 % 100 mL IVPB  Status:  Discontinued        2 g 200 mL/hr over 30 Minutes Intravenous Every 12 hours 01/29/24 1702 01/30/24 0953   01/29/24 1800  vancomycin  (VANCOREADY) IVPB 500 mg/100 mL        500 mg 100 mL/hr over 60 Minutes Intravenous  Once 01/29/24 1704 01/29/24 1903   01/29/24 1000  ceFEPIme  (MAXIPIME ) 2 g in sodium chloride  0.9 % 100 mL IVPB        2 g 200 mL/hr over 30 Minutes Intravenous  Once 01/29/24 0959 01/29/24 1059   01/29/24 1000  metroNIDAZOLE  (FLAGYL ) IVPB 500 mg        500 mg 100 mL/hr over 60 Minutes Intravenous  Once 01/29/24 0959 01/29/24 1207   01/29/24 1000  vancomycin  (VANCOCIN ) IVPB 1000 mg/200 mL premix        1,000 mg 200 mL/hr over 60 Minutes Intravenous  Once 01/29/24 0959 01/29/24 1356       Significant Hospital Events: Including procedures, antibiotic start and stop dates in addition to other pertinent events   1/31.  Antibiotics changed order Unasyn  and Zithromax .  Patient not alert enough this morning to be placed on a diet. 2/1.  Patient's mental status better than yesterday.  Able to be placed back on a diet.  Patient does have a history of dementia.  Will restart  oral medications. 2/2. Transfer to the ICU secondary to increasing oxygen requirements. This morning on 100% oxygen on the BiPAP. 1 dose of Lasix  given with x-ray showing worsening aeration with a mix of airspace disease, volume loss and pulmonary edema. Case discussed with critical care specialist and then patient required to go on Levophed  so critical care team will take over. Intubated and underwent Bronchoscopy, extubated post procedure. 2/3: Weaned off  vasopressors.  Weaned from BiPAP to [facility]. Passed Speech evaluation . BAL cultures pending. 2/4: Remains off vasopressors, decrease stress dose steroids.  On 80% FiO2 via HHFNC and tolerating, weaning as able.  Consult PT/OT to mobilize as able.  Interim History / Subjective:  As outlined above in significant hospital events  Objective   Blood pressure 134/73, pulse (!) 50, temperature 98.4 F (36.9 C), temperature source Axillary, resp. rate 17, height 5' 2 (1.575 m), weight 55.7 kg, SpO2 95%.    FiO2 (%):  [50 %-80 %] 50 % PEEP:  [5 cmH20] 5 cmH20 Pressure Support:  [5 cmH20] 5 cmH20   Intake/Output Summary (Last 24 hours) at 02/03/2024 0834 Last data filed at 02/02/2024 1800 Gross per 24 hour  Intake 1263.49 ml  Output 600 ml  Net 663.49 ml   Filed Weights   01/29/24 0955 02/02/24 0630 02/03/24 0500  Weight: 61.2 kg 55.5 kg 55.7 kg    Examination: General: Acute on chronically ill appearing female, laying in bed, on HFNC, in NAD HENT: Atraumatic, normocephalic, neck supple, no JVD Lungs: Clear breath sounds throughout, even, nonlabored, normal effort Cardiovascular: RRR, s1s2, no M/R/G Abdomen: Soft, nontender, nondistended, no guarding or rebound tenderness, BS +x4 Extremities: Normal bulk and tone, no deformities, no edema, no cyanosis Neuro: Awake and alert, disoriented x4 (severe dementia at baseline), intermittently follows commands, no focal deficits noted, pupils PERRL GU: External female catheter in place  Resolved  Hospital Problem list   Septic Shock  Assessment & Plan:   #Acute Hypoxic Respiratory Failure in the setting of Pneumonia and mucus plugging with atelectasis EXTUBATED 2/2 S/P BRONCHOSCOPY 2/2 -Supplemental O2 as needed to maintain O2 sats >92% -BiPAP qhs (pt is DNR/DNI) -Follow intermittent Chest X-ray & ABG as needed -Bronchodilators  -IV Steroids -ABX as above -Diuresis as BP and renal function permits -Pulmonary toilet as able  #Septic Shock ~ RESOLVED #Elevated Troponin, suspect demand ischemia -Continuous cardiac monitoring -Maintain MAP >65 -Cautious IV fluids -Vasopressors as needed to maintain MAP goal ~ weaned off -Weaning stress dose steroids -Lactic acid is normalized -HS Troponin peaked at 89 -Diuresis as BP and renal function permits  #Severe Sepsis (Met SIRS Criteria: fever and tachycardia) #Pneumonia -Monitor fever curve -Trend WBC's & Procalcitonin -Follow cultures as above -Continue empiric Zosyn  pending cultures & sensitivities  #Mild Hypokalemia -Monitor I&O's / urinary output -Follow BMP -Ensure adequate renal perfusion -Avoid nephrotoxic agents as able -Replace electrolytes as indicated ~ Pharmacy following for assistance with electrolyte replacement -Gentle IV fluids  #Mild Thrombocytopenia -Monitor for S/Sx of bleeding -Trend CBC -Heparin  SQ for VTE Prophylaxis  -Transfuse for Hgb <7  #Acute Metabolic Encephalopathy #Dementia -Treatment of metabolic derangements as outlined above -Provide supportive care -Promote normal sleep/wake cycle and family presence -Avoid sedating medications as able -Continue Aricept      Best Practice (right click and Reselect all SmartList Selections daily)   Diet/type: Regular DVT prophylaxis: Lovenox  GI prophylaxis: N/A Lines: N/A Foley:  N/A Code Status:  DNR Last date of multidisciplinary goals of care discussion [2/4]  2/4: Updated pt's daughter at bedside on plan of care  Labs    CBC: Recent Labs  Lab 01/29/24 1004 01/30/24 0437 01/31/24 0342 02/01/24 0459 02/02/24 0334 02/03/24 0421  WBC 6.5 8.5 6.5 5.9 6.0 9.2  NEUTROABS 4.7  --   --  4.6  --   --   HGB 14.0 12.8 11.3* 11.8* 10.6* 10.2*  HCT 42.3 38.5 33.2* 35.9* 31.3* 30.5*  MCV 96.8 97.5  95.1 98.4 95.7 95.6  PLT 161 124* 113* 135* 135* 149*    Basic Metabolic Panel: Recent Labs  Lab 01/30/24 0437 01/31/24 0342 02/01/24 0459 02/02/24 0334 02/02/24 1129 02/03/24 0421  NA 136 140 144 140  --  140  K 3.5 3.9 4.2 3.4*  --  3.4*  CL 99 100 101 99  --  99  CO2 28 30 28  32  --  30  GLUCOSE 109* 101* 105* 146*  --  146*  BUN 13 15 19  24*  --  35*  CREATININE 0.79 0.79 0.83 0.83  --  0.96  CALCIUM 8.5* 8.3* 8.7* 8.0*  --  8.3*  MG  --   --  1.8 2.1  --  2.3  PHOS  --   --   --  3.1 3.4 2.8   GFR: Estimated Creatinine Clearance: 38.8 mL/min (by C-G formula based on SCr of 0.96 mg/dL). Recent Labs  Lab 01/29/24 0959 01/29/24 1004 01/29/24 1212 01/29/24 1819 01/30/24 0437 01/31/24 0342 02/01/24 0459 02/02/24 0334 02/03/24 0421  PROCALCITON  --   --   --  <0.10  --  2.03 1.59  --   --   WBC  --    < >  --   --    < > 6.5 5.9 6.0 9.2  LATICACIDVEN 1.7  --  1.4  --   --   --   --   --   --    < > = values in this interval not displayed.    Liver Function Tests: Recent Labs  Lab 01/29/24 0946 01/30/24 0437 02/01/24 0459  AST 27 52* 70*  ALT 16 21 28   ALKPHOS 45 38 39  BILITOT 0.9 0.8 0.6  PROT 7.1 6.3* 5.9*  ALBUMIN 4.0 3.4* 2.8*   No results for input(s): LIPASE, AMYLASE in the last 168 hours. No results for input(s): AMMONIA in the last 168 hours.  ABG    Component Value Date/Time   HCO3 35.1 (H) 01/29/2024 1007   O2SAT 36.4 01/29/2024 1007     Coagulation Profile: Recent Labs  Lab 01/29/24 0946 01/29/24 1004  INR 1.0 1.0    Cardiac Enzymes: Recent Labs  Lab 01/31/24 0341  CKTOTAL 1,709*    HbA1C: No results found for: HGBA1C  CBG: Recent Labs   Lab 02/02/24 1633 02/02/24 1934 02/03/24 0007 02/03/24 0331 02/03/24 0719  GLUCAP 128* 162* 183* 134* 130*    Review of Systems:   Unable to assess due to AMS and dementia    Past Medical History:  She,  has a past medical history of Anxiety, Hyperlipidemia, and Hypertension.   Surgical History:   Past Surgical History:  Procedure Laterality Date   BREAST SURGERY     TONSILLECTOMY AND ADENOIDECTOMY     TUBAL LIGATION       Social History:   reports that she has never smoked. She has never used smokeless tobacco. She reports current alcohol use. She reports that she does not use drugs.   Family History:  Her family history includes Anxiety disorder in her daughter; Asthma in her maternal grandfather; Breast cancer in her paternal grandmother; Coronary artery disease in her mother; Diabetes in her brother; Heart attack in her paternal grandfather; Heart disease in her mother; Hypothyroidism in her daughter and maternal grandmother; Lupus in her sister.   Allergies No Known Allergies   Home Medications  Prior to Admission medications   Medication Sig Start Date End Date Taking? Authorizing  Provider  ARIPiprazole  (ABILIFY ) 2 MG tablet Take 1 tablet (2 mg total) by mouth at bedtime. Patient taking differently: Take 2 mg by mouth daily. 06/26/18  Yes Chrismon, Marinda BRAVO, PA-C  cholecalciferol (VITAMIN D3) 25 MCG (1000 UNIT) tablet Take 2,000 Units by mouth daily.   Yes [provider]  divalproex  (DEPAKOTE  SPRINKLE) 125 MG capsule Take 125 mg by mouth 2 (two) times daily. 01/21/24  Yes [provider]  donepezil  (ARICEPT ) 10 MG tablet Take 1 tablet (10 mg total) by mouth at bedtime. Patient taking differently: Take 10 mg by mouth in the morning. 01/10/17  Yes Chrismon, Marinda BRAVO, PA-C  hydrochlorothiazide (MICROZIDE) 12.5 MG capsule Take 12.5 mg by mouth daily. 01/21/24  Yes [provider]  melatonin 5 MG TABS Take 5 mg by mouth at bedtime.   Yes  [provider]  sertraline  (ZOLOFT ) 50 MG tablet Take 75 mg by mouth daily. 01/21/24  Yes [provider]  simvastatin  (ZOCOR ) 40 MG tablet Take 1 tablet (40 mg total) by mouth at bedtime. 07/22/16  Yes Chrismon, Marinda BRAVO, PA-C     Critical care time: 40 minutes     Inge Lecher, AGACNP-BC [facility] Prefer epic messenger for cross cover needs If after hours, please call E-link

## 2024-02-03 NOTE — Evaluation (Signed)
 Occupational Therapy Evaluation Patient Details Name: Kathryn Morales MRN: [id number] DOB: 01-15-1946 Today's Date: 02/03/2024   History of Present Illness 78 year old female with history of dementia who is presenting for respiratory failure and encephalopathy secondary to aspiration pneumonia with complete left lung white out requiring intubation and bronchoscopy for therapeutic aspiration of secretions. She's since improved but continues with acute hypoxic respiratory failure requiring HFNC alternating with BiPAP for support.   Clinical Impression   Ms Veazey was seen for OT evaluation this date. Prior to hospital admission, pt was IND for mobility, assist for ADLs. Pt lives at [facility] memory care unit. Pt oriented to self only, speaks in word salad. Mobility attempts limited by cognition and poor command following  Pt currently requires MAX A exit bed and hand over hand face washing in sitting. MAX A sit<>stand, improves with hand held assist from daughter. MAX A bed>chair squat pivot t/f. Pt would benefit from skilled OT to address noted impairments and functional limitations (see below for any additional details). Upon hospital discharge, recommend OT on return to memory care facility, anticipate improved participation in familiar environment.     If plan is discharge home, recommend the following: A lot of help with walking and/or transfers;A lot of help with bathing/dressing/bathroom;Supervision due to cognitive status    Functional Status Assessment  Patient has had a recent decline in their functional status and demonstrates the ability to make significant improvements in function in a reasonable and predictable amount of time.  Equipment Recommendations  Wheelchair (measurements OT)    Recommendations for Other Services       Precautions / Restrictions Precautions Precautions: Fall Restrictions Weight Bearing Restrictions Per Provider Order: No      Mobility Bed  Mobility Overal bed mobility: Needs Assistance Bed Mobility: Supine to Sit     Supine to sit: Max assist          Transfers Overall transfer level: Needs assistance   Transfers: Bed to chair/wheelchair/BSC, Sit to/from Stand Sit to Stand: Mod assist, +2 physical assistance   Squat pivot transfers: Max assist              Balance Overall balance assessment: Needs assistance Sitting-balance support: No upper extremity supported, Feet supported Sitting balance-Leahy Scale: Fair     Standing balance support: Bilateral upper extremity supported Standing balance-Leahy Scale: Poor                             ADL either performed or assessed with clinical judgement   ADL Overall ADL's : Needs assistance/impaired                                       General ADL Comments: MAX A hand over hand face washing in sitting.SABRA MAX A simulated BSC t/f, pt limited by cognition                  Pertinent Vitals/Pain Pain Assessment Pain Assessment: No/denies pain     Extremity/Trunk Assessment Upper Extremity Assessment Upper Extremity Assessment: Generalized weakness   Lower Extremity Assessment Lower Extremity Assessment: Generalized weakness       Communication Communication Communication: No apparent difficulties   Cognition Arousal: Alert Behavior During Therapy: WFL for tasks assessed/performed Overall Cognitive Status: Within Functional Limits for tasks assessed  Home Living Family/patient expects to be discharged to:: Assisted living                             Home Equipment: None   Additional Comments: [facility]      Prior Functioning/Environment Prior Level of Function : Needs assist             Mobility Comments: ambulatory no AD ADLs Comments: assist for all ADLs as needed        OT Problem List: Decreased range  of motion;Decreased activity tolerance;Impaired balance (sitting and/or standing)      OT Treatment/Interventions: Self-care/ADL training;Therapeutic exercise;Energy conservation;DME and/or AE instruction;Therapeutic activities    OT Goals(Current goals can be found in the care plan section) Acute Rehab OT Goals Patient Stated Goal: to return to [facility] OT Goal Formulation: With family Time For Goal Achievement: 02/17/24 Potential to Achieve Goals: Fair ADL Goals Pt Will Perform Grooming: sitting;with set-up;with supervision Pt Will Perform Lower Body Dressing: with min assist;with caregiver independent in assisting;sit to/from stand Pt Will Transfer to Toilet: with contact guard assist;ambulating;regular height toilet  OT Frequency: Min 1X/week    Co-evaluation              AM-PAC OT 6 Clicks Daily Activity     Outcome Measure Help from another person eating meals?: None Help from another person taking care of personal grooming?: A Little Help from another person toileting, which includes using toliet, bedpan, or urinal?: A Lot Help from another person bathing (including washing, rinsing, drying)?: A Lot Help from another person to put on and taking off regular upper body clothing?: A Lot Help from another person to put on and taking off regular lower body clothing?: A Lot 6 Click Score: 15   End of Session Equipment Utilized During Treatment: Gait belt Nurse Communication: Mobility status  Activity Tolerance: Patient tolerated treatment well Patient left: in chair;with call bell/phone within reach;with family/visitor present  OT Visit Diagnosis: Other abnormalities of gait and mobility (R26.89);Muscle weakness (generalized) (M62.81)                Time: 8886-8860 OT Time Calculation (min): 26 min Charges:  OT General Charges $OT Visit: 1 Visit OT Evaluation $OT Eval Moderate Complexity: 1 Mod OT Treatments $Self Care/Home Management : 8-22 mins  Elston Slot,  M.S. OTR/L  02/03/24, 2:34 PM  ascom [phone]

## 2024-02-03 NOTE — Progress Notes (Addendum)
 Speech Language Pathology Treatment: Dysphagia  Patient Details Name: Kathryn Morales MRN: [id number] DOB: Dec 22, 1946 Today's Date: 02/03/2024 Time: 8849-8789 SLP Time Calculation (min) (ACUTE ONLY): 20 min  Assessment / Plan / Recommendation Clinical Impression  Pt seen at bedside for follow up after BSE completed 02/02/24. Pt seated upright in recliner. Daughter present. Pt continues to require significant O2 via HFNC (80%). RN and daughter report pt has tolerated meals of Dys2 (fine chop) and thin liquids without overt s/s aspiration. Pt accepted trials of thin liquid via straw and puree. Timely oral prep and clearing noted. No overt s/s aspiration observed following PO trials. Given ongoing high O2 requirement, will continue current diet at this time. SLP will continue to follow to assess readiness to advance textures as O2 requirement lessens. Safe swallow precautions posted in pt room.   HPI HPI: Pr MD progress note, 78 year old female past medical history of dementia, hypertension. She presents to the hospital with fever, hypoxia and acute metabolic encephalopathy. She has worsening fatigue and malaise over the past 1 to 2 days found to be leaning in her wheelchair to the left side not responding normally. Patient had a fever of 103 in the emergency room. Lumbar puncture was negative. Patient started empirically on antibiotics. CT scan of the chest shows complete atelectasis/collapse of the left lower lobe. Left lower lobe bronchus is completely occluded. Findings favor mucous plugging and postobstructive atelectasis. Follow-up to resolution is recommended as an obstructing bronchial mass is not entirely excluded. Head CT 1/30: No acute intracranial abnormality. Chronic cerebral volume loss and white matter changes. Pt currently on 5L high flow nasal canula and NPO.      SLP Plan  Continue with current plan of care      Recommendations for follow up therapy are one component of a  multi-disciplinary discharge planning process, led by the attending physician.  Recommendations may be updated based on patient status, additional functional criteria and insurance authorization.    Recommendations  Diet recommendations: Dysphagia 2 (fine chop);Thin liquid Liquids provided via: Cup;Straw Medication Administration: Crushed with puree Supervision: Full supervision/cueing for compensatory strategies Compensations: Minimize environmental distractions;Slow rate;Small sips/bites Postural Changes and/or Swallow Maneuvers: Seated upright 90 degrees;Upright 30-60 min after meal                  Oral care BID   Frequent or constant Supervision/Assistance Dysphagia, unspecified (R13.10)     Continue with current plan of care    Grainne Knights B. Dory, MSP, CCC-SLP Speech Language Pathologist  Dory Caprice Daring 02/03/2024, 12:18 PM

## 2024-02-03 NOTE — Progress Notes (Addendum)
 0800 Patient confused with tremors of arms and hands. Unable to feed self breakfast. Most of her speech is garbled and unrecognizable but one of her 4 words actually answers the question asked.  0830 Fed about 25% of her breakfast. Chews and swallows well. 1000 Daughter in to visit. Patient extremely happy. 1200 Ate 50% of lunch being fed by daughter.Chatty in her gabled speech. 1600 Placed back in bed against her will. Patient not happy. 1800 PureWic malfunction. Bed changed and peri care done. Patient talkative.

## 2024-02-03 NOTE — Plan of Care (Signed)
  Problem: Clinical Measurements: Goal: Signs and symptoms of infection will decrease Outcome: Progressing   Problem: Clinical Measurements: Goal: Ability to maintain clinical measurements within normal limits will improve Outcome: Progressing Goal: Will remain free from infection Outcome: Progressing   Problem: Nutrition: Goal: Adequate nutrition will be maintained Outcome: Progressing   Problem: Elimination: Goal: Will not experience complications related to urinary retention Outcome: Progressing   Problem: Pain Managment: Goal: General experience of comfort will improve and/or be controlled Outcome: Progressing   Problem: Respiratory: Goal: Ability to maintain adequate ventilation will improve Outcome: Not Progressing   Problem: Clinical Measurements: Goal: Respiratory complications will improve Outcome: Not Progressing

## 2024-02-03 NOTE — Plan of Care (Signed)
  Problem: SLP Dysphagia Goals Goal: Patient will utilize recommended strategies Description: Patient will utilize recommended strategies during swallow to increase swallowing safety with Outcome: Progressing Goal: Patient will demonstrate readiness for PO's Description: Patient will demonstrate readiness for PO's and/or instrumental swallow study as evidenced by: Outcome: Progressing

## 2024-02-04 DIAGNOSIS — D649 Anemia, unspecified: Secondary | ICD-10-CM | POA: Diagnosis not present

## 2024-02-04 DIAGNOSIS — J9601 Acute respiratory failure with hypoxia: Secondary | ICD-10-CM | POA: Diagnosis not present

## 2024-02-04 DIAGNOSIS — E876 Hypokalemia: Secondary | ICD-10-CM | POA: Diagnosis not present

## 2024-02-04 DIAGNOSIS — A419 Sepsis, unspecified organism: Secondary | ICD-10-CM | POA: Diagnosis not present

## 2024-02-04 LAB — GLUCOSE, CAPILLARY
Glucose-Capillary: 121 mg/dL — ABNORMAL HIGH (ref 70–99)
Glucose-Capillary: 113 mg/dL — ABNORMAL HIGH (ref 70–99)
Glucose-Capillary: 163 mg/dL — ABNORMAL HIGH (ref 70–99)
Glucose-Capillary: 116 mg/dL — ABNORMAL HIGH (ref 70–99)
Glucose-Capillary: 105 mg/dL — ABNORMAL HIGH (ref 70–99)
Glucose-Capillary: 97 mg/dL (ref 70–99)

## 2024-02-04 LAB — CBC
HCT: 28.6 % — ABNORMAL LOW (ref 36.0–46.0)
Hemoglobin: 9.8 g/dL — ABNORMAL LOW (ref 12.0–15.0)
MCH: 32.3 pg (ref 26.0–34.0)
MCHC: 34.3 g/dL (ref 30.0–36.0)
MCV: 94.4 fL (ref 80.0–100.0)
Platelets: 161 10*3/uL (ref 150–400)
RBC: 3.03 MIL/uL — ABNORMAL LOW (ref 3.87–5.11)
RDW: 12.8 % (ref 11.5–15.5)
WBC: 8.7 10*3/uL (ref 4.0–10.5)
nRBC: 0 % (ref 0.0–0.2)

## 2024-02-04 LAB — MAGNESIUM: Magnesium: 2.2 mg/dL (ref 1.7–2.4)

## 2024-02-04 LAB — BASIC METABOLIC PANEL
Anion gap: 13 (ref 5–15)
BUN: 28 mg/dL — ABNORMAL HIGH (ref 8–23)
CO2: 29 mmol/L (ref 22–32)
Calcium: 8.1 mg/dL — ABNORMAL LOW (ref 8.9–10.3)
Chloride: 97 mmol/L — ABNORMAL LOW (ref 98–111)
Creatinine, Ser: 0.82 mg/dL (ref 0.44–1.00)
GFR, Estimated: >60 mL/min (ref >60)
Glucose, Bld: 120 mg/dL — ABNORMAL HIGH (ref 70–99)
Potassium: 2.8 mmol/L — ABNORMAL LOW (ref 3.5–5.1)
Sodium: 139 mmol/L (ref 135–145)

## 2024-02-04 LAB — CULTURE, BAL-QUANTITATIVE W GRAM STAIN: Culture: NO GROWTH

## 2024-02-04 LAB — POTASSIUM: Potassium: 3 mmol/L — ABNORMAL LOW (ref 3.5–5.1)

## 2024-02-04 LAB — PHOSPHORUS: Phosphorus: 3.2 mg/dL (ref 2.5–4.6)

## 2024-02-04 MED ORDER — POTASSIUM CHLORIDE 10 MEQ/100ML IV SOLN
10.0000 meq | INTRAVENOUS | Status: AC
  Administered 2024-02-04 – 2024-02-05 (×4): 10 meq via INTRAVENOUS
  Filled 2024-02-04 (×4): qty 100

## 2024-02-04 MED ORDER — POTASSIUM CHLORIDE 20 MEQ PO PACK
40.0000 meq | PACK | Freq: Once | ORAL | Status: DC
  Filled 2024-02-04: qty 2

## 2024-02-04 MED ORDER — HYDROCORTISONE SOD SUC (PF) 100 MG IJ SOLR
50.0000 mg | Freq: Every day | INTRAMUSCULAR | Status: DC
Start: 2024-02-05 — End: 2024-02-05

## 2024-02-04 MED ORDER — POTASSIUM CHLORIDE 10 MEQ/100ML IV SOLN
10.0000 meq | INTRAVENOUS | Status: AC
  Administered 2024-02-04 (×4): 10 meq via INTRAVENOUS
  Filled 2024-02-04 (×4): qty 100

## 2024-02-04 MED ORDER — POTASSIUM CHLORIDE 20 MEQ PO PACK
40.0000 meq | PACK | Freq: Once | ORAL | Status: AC
  Administered 2024-02-04: 40 meq via ORAL
  Filled 2024-02-04: qty 2

## 2024-02-04 NOTE — Plan of Care (Signed)

## 2024-02-04 NOTE — Evaluation (Signed)
 Physical Therapy Evaluation Patient Details Name: Kathryn Morales MRN: [id number] DOB: Feb 10, 1946 Today's Date: 02/04/2024  History of Present Illness  Patient is a 78 year old female admitted with acute metabolic encephalopathy, acute respiratory failure in setting of suspected pneumonia. Developed severe mucus plugging with completed atelectasis requiring bronchoscopy, septic shock. History of dementia.  Clinical Impression  Patient is cooperative during PT evaluation. She is confused but able to follow most single step commands with multi modal cues with increased time and repetition. The patient lives in memory care unit at baseline and and is ambulatory.  Today the patient is on 50L HHFNC. She requires assistance for bed mobility. Sitting balance in poor initially progressing to fair. Sp02 down to 89% with sitting upright and activity tolerance limited by fatigue. Recommend to continue PT to maximize independence and facilitate return to prior level of function and decrease caregiver buren. Anticipate patient will do better with return to familiar environment at her memory care unit.       If plan is discharge home, recommend the following: A little help with walking and/or transfers;A little help with bathing/dressing/bathroom;Assist for transportation;Supervision due to cognitive status   Can travel by private vehicle        Equipment Recommendations  (to be determined at the facility)  Recommendations for Other Services       Functional Status Assessment Patient has had a recent decline in their functional status and demonstrates the ability to make significant improvements in function in a reasonable and predictable amount of time.     Precautions / Restrictions Precautions Precautions: Fall Restrictions Weight Bearing Restrictions Per Provider Order: No      Mobility  Bed Mobility Overal bed mobility: Needs Assistance Bed Mobility: Supine to Sit, Sit to Supine      Supine to sit: Max assist Sit to supine: Max assist   General bed mobility comments: assistance for LE and trunk support    Transfers                   General transfer comment: not attempted today. Sp02 down to 89% on 50L HHFNC with sitting and patient fatigued with activity    Ambulation/Gait                  Stairs            Wheelchair Mobility     Tilt Bed    Modified Rankin (Stroke Patients Only)       Balance Overall balance assessment: Needs assistance Sitting-balance support: No upper extremity supported, Feet supported Sitting balance-Leahy Scale: Fair Sitting balance - Comments: initial assistance provided with sitting. Min A progressing to close CGA with increased sitting time                                     Pertinent Vitals/Pain Pain Assessment Breathing: normal Negative Vocalization: none Facial Expression: smiling or inexpressive Body Language: relaxed Consolability: no need to console PAINAD Score: 0    Home Living Family/patient expects to be discharged to:: Assisted living                 Home Equipment: None Additional Comments: [facility]    Prior Function Prior Level of Function : Needs assist             Mobility Comments: ambulatory no AD, slow ADLs Comments: assist for all ADLs  as needed     Extremity/Trunk Assessment   Upper Extremity Assessment Upper Extremity Assessment: Generalized weakness    Lower Extremity Assessment Lower Extremity Assessment: Generalized weakness       Communication   Communication Communication: No apparent difficulties  Cognition Arousal: Alert Behavior During Therapy: WFL for tasks assessed/performed Overall Cognitive Status: History of cognitive impairments - at baseline                                 General Comments: patient is able to follow single step commnads with multi modal cues most of the time. she is  cooperative but confused with baseline dementia        General Comments General comments (skin integrity, edema, etc.): Sp02 down to 89% on 50L HHNFC with sitting and up to 94% at rest while in bed    Exercises     Assessment/Plan    PT Assessment Patient needs continued PT services  PT Problem List Decreased strength;Decreased range of motion;Decreased activity tolerance;Decreased balance;Decreased mobility;Decreased safety awareness;Decreased cognition       PT Treatment Interventions DME instruction;Gait training;Functional mobility training;Therapeutic activities;Therapeutic exercise;Balance training;Neuromuscular re-education;Cognitive remediation;Patient/family education;Wheelchair mobility training    PT Goals (Current goals can be found in the Care Plan section)  Acute Rehab PT Goals Patient Stated Goal: to return to memory care PT Goal Formulation: With family Time For Goal Achievement: 02/18/24 Potential to Achieve Goals: Fair    Frequency Min 1X/week     Co-evaluation               AM-PAC PT 6 Clicks Mobility  Outcome Measure Help needed turning from your back to your side while in a flat bed without using bedrails?: A Lot Help needed moving from lying on your back to sitting on the side of a flat bed without using bedrails?: A Lot Help needed moving to and from a bed to a chair (including a wheelchair)?: A Lot Help needed standing up from a chair using your arms (e.g., wheelchair or bedside chair)?: A Lot Help needed to walk in hospital room?: Total Help needed climbing 3-5 steps with a railing? : Total 6 Click Score: 10    End of Session Equipment Utilized During Treatment: Oxygen Activity Tolerance: Patient limited by fatigue Patient left: in bed;with call bell/phone within reach;with bed alarm set Nurse Communication: Mobility status PT Visit Diagnosis: Muscle weakness (generalized) (M62.81);Unsteadiness on feet (R26.81)    Time: 9146-9094 PT  Time Calculation (min) (ACUTE ONLY): 12 min   Charges:   PT Evaluation $PT Eval High Complexity: 1 High   PT General Charges $$ ACUTE PT VISIT: 1 Visit         Randine Essex, PT, MPT   Randine LULLA Essex 02/04/2024, 10:05 AM

## 2024-02-04 NOTE — Progress Notes (Addendum)
 NAME:  Kathryn Morales, MRN:  [id number], DOB:  08-Mar-1946, LOS: 6 ADMISSION DATE:  01/29/2024, CONSULTATION DATE:  02/01/2024 REFERRING MD:  Dr. Josette, CHIEF COMPLAINT:  Hypotension, worsening hypoxia   Brief Pt Description / Synopsis:  78 y.o female, DNR/DNI, with PMHx significant for dementia admitted with Acute Metabolic Encephalopathy and Acute Hypoxic Respiratory Failure in the setting of suspected pneumonia.  Course complicated by severe mucus plugging with complete atelectasis requiring Bronchoscopy and development of septic shock.  History of Present Illness:  This is a 78 yo F with hx of HTN, dementia who came in with acute hypoxemic respiratory failure.  I was unable to get history from patient due to severe hypoxemia on BIPAP and met with her son Kathryn Morales and daughter Kathryn Morales.  They explain that she had outpatient treatment with abx and failure of therapy for pneumonia with 2 abx courses over past 2 weeks.  The son and daughter are medical POA for patient.  We reviewed her CT scan with findings of complete atelectasis/collapse of the left lower lobe. Left lower lobe bronchus is completely occluded. Her blood work reveals chronic thrombocytopenia with platelets at 127 appx 1 year ago. She required 80% FiO2 on bipap and had labored breathing.  We discussed code status at length. There was discussion regarding intubation and possible bronchoscopy due to CT chest findings with goal of unplugging of left lung with therapeutic aspiration.  Family wanted to discuss among themselves and share that they wish to have full scope of therapy but keep DNR status.   Please see Significant Hospital Events section below for full detailed hospital course.  Pertinent  Medical History   Past Medical History:  Diagnosis Date   Anxiety    Hyperlipidemia    Hypertension     Micro Data:  1/30: SARS-CoV-2/Flu/RSV PCR>>negative 1/30: Blood culture>> no growth to date 1/30: Meningitis/Encephalitis  panel CSF>> negative 2/2: MRSA PCR>> negative 2/2: BAL>>NGTD  Antimicrobials:   Anti-infectives (From admission, onward)    Start     Dose/Rate Route Frequency Ordered Stop   02/02/24 0700  vancomycin  (VANCOCIN ) IVPB 1000 mg/200 mL premix  Status:  Discontinued        1,000 mg 200 mL/hr over 60 Minutes Intravenous Every 24 hours 02/01/24 0557 02/01/24 1102   02/01/24 1000  doxycycline  (VIBRA -TABS) tablet 100 mg  Status:  Discontinued        100 mg Oral Every 12 hours 01/31/24 1315 02/01/24 0439   02/01/24 1000  doxycycline  (VIBRA -TABS) tablet 100 mg  Status:  Discontinued        100 mg Oral Every 12 hours 02/01/24 0435 02/01/24 0439   02/01/24 0700  piperacillin -tazobactam (ZOSYN ) IVPB 3.375 g        3.375 g 12.5 mL/hr over 240 Minutes Intravenous Every 8 hours 02/01/24 0600 02/05/24 2359   02/01/24 0600  ceFEPIme  (MAXIPIME ) 2 g in sodium chloride  0.9 % 100 mL IVPB  Status:  Discontinued        2 g 200 mL/hr over 30 Minutes Intravenous Every 12 hours 02/01/24 0512 02/01/24 0559   02/01/24 0600  vancomycin  (VANCOREADY) IVPB 1500 mg/300 mL        1,500 mg 150 mL/hr over 120 Minutes Intravenous  Once 02/01/24 0512 02/01/24 0818   02/01/24 0530  doxycycline  (VIBRAMYCIN ) 100 mg in sodium chloride  0.9 % 250 mL IVPB  Status:  Discontinued        100 mg 125 mL/hr over 120 Minutes Intravenous Every 12  hours 02/01/24 0439 02/01/24 0500   01/31/24 1000  azithromycin  (ZITHROMAX ) tablet 250 mg  Status:  Discontinued       Placed in Followed by Linked Group   250 mg Oral Daily 01/30/24 0716 01/30/24 0855   01/30/24 1800  vancomycin  (VANCOCIN ) IVPB 750 mg/150 ml premix  Status:  Discontinued        750 mg 150 mL/hr over 60 Minutes Intravenous Every 24 hours 01/29/24 1711 01/30/24 0949   01/30/24 1800  vancomycin  (VANCOCIN ) IVPB 1000 mg/200 mL premix  Status:  Discontinued        1,000 mg 200 mL/hr over 60 Minutes Intravenous Every 24 hours 01/30/24 0950 01/30/24 0953   01/30/24 1800   Ampicillin -Sulbactam (UNASYN ) 3 g in sodium chloride  0.9 % 100 mL IVPB  Status:  Discontinued        3 g 200 mL/hr over 30 Minutes Intravenous Every 6 hours 01/30/24 0953 02/01/24 0508   01/30/24 1000  azithromycin  (ZITHROMAX ) tablet 500 mg  Status:  Discontinued       Placed in Followed by Linked Group   500 mg Oral Daily 01/30/24 0716 01/30/24 0855   01/30/24 1000  azithromycin  (ZITHROMAX ) 500 mg in sodium chloride  0.9 % 250 mL IVPB  Status:  Discontinued        500 mg 250 mL/hr over 60 Minutes Intravenous Every 24 hours 01/30/24 0855 01/31/24 1315   01/29/24 2200  metroNIDAZOLE  (FLAGYL ) IVPB 500 mg  Status:  Discontinued        500 mg 100 mL/hr over 60 Minutes Intravenous Every 12 hours 01/29/24 1645 01/30/24 0717   01/29/24 2200  ceFEPIme  (MAXIPIME ) 2 g in sodium chloride  0.9 % 100 mL IVPB  Status:  Discontinued        2 g 200 mL/hr over 30 Minutes Intravenous Every 12 hours 01/29/24 1702 01/30/24 0953   01/29/24 1800  vancomycin  (VANCOREADY) IVPB 500 mg/100 mL        500 mg 100 mL/hr over 60 Minutes Intravenous  Once 01/29/24 1704 01/29/24 1903   01/29/24 1000  ceFEPIme  (MAXIPIME ) 2 g in sodium chloride  0.9 % 100 mL IVPB        2 g 200 mL/hr over 30 Minutes Intravenous  Once 01/29/24 0959 01/29/24 1059   01/29/24 1000  metroNIDAZOLE  (FLAGYL ) IVPB 500 mg        500 mg 100 mL/hr over 60 Minutes Intravenous  Once 01/29/24 0959 01/29/24 1207   01/29/24 1000  vancomycin  (VANCOCIN ) IVPB 1000 mg/200 mL premix        1,000 mg 200 mL/hr over 60 Minutes Intravenous  Once 01/29/24 0959 01/29/24 1356      Significant Hospital Events: Including procedures, antibiotic start and stop dates in addition to other pertinent events   1/31.  Antibiotics changed order Unasyn  and Zithromax .  Patient not alert enough this morning to be placed on a diet. 2/1.  Patient's mental status better than yesterday.  Able to be placed back on a diet.  Patient does have a history of dementia.  Will restart oral  medications. 2/2. Transfer to the ICU secondary to increasing oxygen requirements. This morning on 100% oxygen on the BiPAP. 1 dose of Lasix  given with x-ray showing worsening aeration with a mix of airspace disease, volume loss and pulmonary edema. Case discussed with critical care specialist and then patient required to go on Levophed  so critical care team will take over. Intubated and underwent Bronchoscopy, extubated post procedure. 2/3: Weaned off vasopressors.  Weaned from BiPAP to [facility]. Passed Speech evaluation . BAL cultures pending. 2/4: Remains off vasopressors, decrease stress dose steroids.  On 80% FiO2 via HHFNC and tolerating, weaning as able.  Consult PT/OT to mobilize as able. 2/5: No acute events overnight tolerating HHFNC @50L /55%  Interim History / Subjective:  As outlined above in significant hospital events  Objective   Blood pressure 139/88, pulse (!) 59, temperature 98.2 F (36.8 C), temperature source Axillary, resp. rate 13, height 5' 2 (1.575 m), weight 56.7 kg, SpO2 95%.    FiO2 (%):  [50 %-80 %] 60 % PEEP:  [5 cmH20] 5 cmH20 Pressure Support:  [5 cmH20] 5 cmH20   Intake/Output Summary (Last 24 hours) at 02/04/2024 0732 Last data filed at 02/04/2024 0600 Gross per 24 hour  Intake 963.22 ml  Output 1200 ml  Net -236.78 ml   Filed Weights   02/02/24 0630 02/03/24 0500 02/04/24 0600  Weight: 55.5 kg 55.7 kg 56.7 kg    Examination: General: Acute on chronically ill appearing female, laying in bed, on HFNC, in NAD HENT: Atraumatic, normocephalic, neck supple, no JVD Lungs: Clear throughout, even, non labored  Cardiovascular: NSR, s1s2, no m/r/g, 2+ radial/2+ distal pulses, no edema  Abdomen: +BS x4, soft, non distended, non tender  Extremities: Normal bulk and tone Neuro: Awake but disoriented x4 (severe dementia at baseline), intermittently follows commands, PERRLA  GU: External female catheter in place draining yellow urine   Resolved Hospital Problem list    Septic Shock  Assessment & Plan:   #Acute hypoxic respiratory failure in the setting of pneumonia and mucus plugging with atelectasis EXTUBATED 2/2 S/P BRONCHOSCOPY 2/2 - Supplemental O2 as needed to maintain O2 sats >92% - BiPAP qhs (pt is DNR/DNI) - Follow intermittent CXR & ABG's as needed - Continue scheduled and prn bronchodilator therapy  - Hypertonic saline  - IV steroids: wean as tolerated  - Diuresis as BP and renal function permits - Pulmonary toilet as able  #Septic shock ~ RESOLVED #Elevated troponin, suspect demand ischemia - Continuous telemetry monitoring  - Maintain MAP 65 or higher  - Weaning stress dose steroids - Lactic acid is normalized - HS Troponin peaked at 89 - Diuresis as BP and renal function permits  #Severe sepsis (Met SIRS Criteria: fever and tachycardia) #Pneumonia - Trend WBC and monitor fever curve  - Tend PCT  - Follow cultures  - Continue empiric zosyn  pending cultures & sensitivities  #Hypokalemia - Trend BMP  - Replace electrolytes as indicated  - Strict I&O's  #Mild thrombocytopenia #Anemia without signs of obvious bleeding  - Trend CBC  - Monitor for s/sx of bleeding  - Transfuse for hgb <7 - VTE px: subcutaneous lovenox    #Acute metabolic encephalopathy #Dementia - Treat metabolic derangements  - Provide supportive care - Promote normal sleep/wake cycle and family presence - Avoid sedating medications as able - Continue aricept  - PT/OT   Best Practice (right click and Reselect all SmartList Selections daily)   Diet/type: DYS 2 DVT prophylaxis: Lovenox  GI prophylaxis: N/A Lines: N/A Foley:  N/A Code Status:  DNR Last date of multidisciplinary goals of care discussion [02/04/24]  2/5: Will update pt's daughter when she arrives at bedside   Labs   CBC: Recent Labs  Lab 01/29/24 1004 01/30/24 0437 01/31/24 0342 02/01/24 0459 02/02/24 0334 02/03/24 0421 02/04/24 0416  WBC 6.5   < > 6.5 5.9 6.0 9.2 8.7   NEUTROABS 4.7  --   --  4.6  --   --   --  HGB 14.0   < > 11.3* 11.8* 10.6* 10.2* 9.8*  HCT 42.3   < > 33.2* 35.9* 31.3* 30.5* 28.6*  MCV 96.8   < > 95.1 98.4 95.7 95.6 94.4  PLT 161   < > 113* 135* 135* 149* 161   < > = values in this interval not displayed.    Basic Metabolic Panel: Recent Labs  Lab 01/31/24 0342 02/01/24 0459 02/02/24 0334 02/02/24 1129 02/03/24 0421 02/04/24 0416  NA 140 144 140  --  140 139  K 3.9 4.2 3.4*  --  3.4* 2.8*  CL 100 101 99  --  99 97*  CO2 30 28 32  --  30 29  GLUCOSE 101* 105* 146*  --  146* 120*  BUN 15 19 24*  --  35* 28*  CREATININE 0.79 0.83 0.83  --  0.96 0.82  CALCIUM 8.3* 8.7* 8.0*  --  8.3* 8.1*  MG  --  1.8 2.1  --  2.3 2.2  PHOS  --   --  3.1 3.4 2.8 3.2   GFR: Estimated Creatinine Clearance: 45.4 mL/min (by C-G formula based on SCr of 0.82 mg/dL). Recent Labs  Lab 01/29/24 0959 01/29/24 1004 01/29/24 1212 01/29/24 1819 01/30/24 0437 01/31/24 0342 02/01/24 0459 02/02/24 0334 02/03/24 0421 02/04/24 0416  PROCALCITON  --   --   --  <0.10  --  2.03 1.59  --   --   --   WBC  --    < >  --   --    < > 6.5 5.9 6.0 9.2 8.7  LATICACIDVEN 1.7  --  1.4  --   --   --   --   --   --   --    < > = values in this interval not displayed.    Liver Function Tests: Recent Labs  Lab 01/29/24 0946 01/30/24 0437 02/01/24 0459  AST 27 52* 70*  ALT 16 21 28   ALKPHOS 45 38 39  BILITOT 0.9 0.8 0.6  PROT 7.1 6.3* 5.9*  ALBUMIN 4.0 3.4* 2.8*   No results for input(s): LIPASE, AMYLASE in the last 168 hours. No results for input(s): AMMONIA in the last 168 hours.  ABG    Component Value Date/Time   HCO3 35.1 (H) 01/29/2024 1007   O2SAT 36.4 01/29/2024 1007     Coagulation Profile: Recent Labs  Lab 01/29/24 0946 01/29/24 1004  INR 1.0 1.0    Cardiac Enzymes: Recent Labs  Lab 01/31/24 0341  CKTOTAL 1,709*    HbA1C: No results found for: HGBA1C  CBG: Recent Labs  Lab 02/03/24 0719 02/03/24 1543  02/03/24 2004 02/03/24 2334 02/04/24 0406  GLUCAP 130* 128* 171* 146* 121*    Review of Systems:   Unable to assess due to AMS and dementia   Past Medical History:  She,  has a past medical history of Anxiety, Hyperlipidemia, and Hypertension.   Surgical History:   Past Surgical History:  Procedure Laterality Date   BREAST SURGERY     TONSILLECTOMY AND ADENOIDECTOMY     TUBAL LIGATION       Social History:   reports that she has never smoked. She has never used smokeless tobacco. She reports current alcohol use. She reports that she does not use drugs.   Family History:  Her family history includes Anxiety disorder in her daughter; Asthma in her maternal grandfather; Breast cancer in her paternal grandmother; Coronary artery disease in her mother; Diabetes  in her brother; Heart attack in her paternal grandfather; Heart disease in her mother; Hypothyroidism in her daughter and maternal grandmother; Lupus in her sister.   Allergies No Known Allergies   Home Medications  Prior to Admission medications   Medication Sig Start Date End Date Taking? Authorizing Provider  ARIPiprazole  (ABILIFY ) 2 MG tablet Take 1 tablet (2 mg total) by mouth at bedtime. Patient taking differently: Take 2 mg by mouth daily. 06/26/18  Yes Chrismon, Marinda BRAVO, PA-C  cholecalciferol (VITAMIN D3) 25 MCG (1000 UNIT) tablet Take 2,000 Units by mouth daily.   Yes [provider]  divalproex  (DEPAKOTE  SPRINKLE) 125 MG capsule Take 125 mg by mouth 2 (two) times daily. 01/21/24  Yes [provider]  donepezil  (ARICEPT ) 10 MG tablet Take 1 tablet (10 mg total) by mouth at bedtime. Patient taking differently: Take 10 mg by mouth in the morning. 01/10/17  Yes Chrismon, Marinda BRAVO, PA-C  hydrochlorothiazide (MICROZIDE) 12.5 MG capsule Take 12.5 mg by mouth daily. 01/21/24  Yes [provider]  melatonin 5 MG TABS Take 5 mg by mouth at bedtime.   Yes [provider]  sertraline  (ZOLOFT )  50 MG tablet Take 75 mg by mouth daily. 01/21/24  Yes [provider]  simvastatin  (ZOCOR ) 40 MG tablet Take 1 tablet (40 mg total) by mouth at bedtime. 07/22/16  Yes Chrismon, Marinda BRAVO, PA-C     Critical care time: 36 minutes    Lonell Moose, AGNP  Pulmonary/Critical Care Pager [phone] (please enter 7 digits) PCCM Consult Pager [phone] (please enter 7 digits)

## 2024-02-04 NOTE — Plan of Care (Signed)
  Problem: Fluid Volume: Goal: Hemodynamic stability will improve Outcome: Progressing   Problem: Clinical Measurements: Goal: Diagnostic test results will improve Outcome: Progressing Goal: Signs and symptoms of infection will decrease Outcome: Progressing   Problem: Respiratory: Goal: Ability to maintain adequate ventilation will improve Outcome: Progressing   Problem: Clinical Measurements: Goal: Ability to maintain clinical measurements within normal limits will improve Outcome: Progressing Goal: Will remain free from infection Outcome: Progressing Goal: Diagnostic test results will improve Outcome: Progressing Goal: Cardiovascular complication will be avoided Outcome: Progressing   Problem: Activity: Goal: Risk for activity intolerance will decrease Outcome: Progressing   Problem: Nutrition: Goal: Adequate nutrition will be maintained Outcome: Progressing   Problem: Coping: Goal: Level of anxiety will decrease Outcome: Progressing   Problem: Elimination: Goal: Will not experience complications related to bowel motility Outcome: Progressing Goal: Will not experience complications related to urinary retention Outcome: Progressing   Problem: Pain Managment: Goal: General experience of comfort will improve and/or be controlled Outcome: Progressing   Problem: Safety: Goal: Ability to remain free from injury will improve Outcome: Progressing   Problem: Skin Integrity: Goal: Risk for impaired skin integrity will decrease Outcome: Progressing

## 2024-02-05 DIAGNOSIS — J189 Pneumonia, unspecified organism: Secondary | ICD-10-CM | POA: Diagnosis not present

## 2024-02-05 DIAGNOSIS — J9601 Acute respiratory failure with hypoxia: Secondary | ICD-10-CM | POA: Diagnosis not present

## 2024-02-05 DIAGNOSIS — G9341 Metabolic encephalopathy: Secondary | ICD-10-CM | POA: Diagnosis not present

## 2024-02-05 LAB — GLUCOSE, CAPILLARY
Glucose-Capillary: 107 mg/dL — ABNORMAL HIGH (ref 70–99)
Glucose-Capillary: 124 mg/dL — ABNORMAL HIGH (ref 70–99)
Glucose-Capillary: 125 mg/dL — ABNORMAL HIGH (ref 70–99)
Glucose-Capillary: 87 mg/dL (ref 70–99)
Glucose-Capillary: 91 mg/dL (ref 70–99)
Glucose-Capillary: 94 mg/dL (ref 70–99)

## 2024-02-05 LAB — BASIC METABOLIC PANEL
Anion gap: 7 (ref 5–15)
BUN: 16 mg/dL (ref 8–23)
CO2: 30 mmol/L (ref 22–32)
Calcium: 8.2 mg/dL — ABNORMAL LOW (ref 8.9–10.3)
Chloride: 100 mmol/L (ref 98–111)
Creatinine, Ser: 0.72 mg/dL (ref 0.44–1.00)
GFR, Estimated: >60 mL/min (ref >60)
Glucose, Bld: 90 mg/dL (ref 70–99)
Potassium: 3.8 mmol/L (ref 3.5–5.1)
Sodium: 137 mmol/L (ref 135–145)

## 2024-02-05 LAB — CBC
HCT: 33.6 % — ABNORMAL LOW (ref 36.0–46.0)
Hemoglobin: 11.4 g/dL — ABNORMAL LOW (ref 12.0–15.0)
MCH: 32 pg (ref 26.0–34.0)
MCHC: 33.9 g/dL (ref 30.0–36.0)
MCV: 94.4 fL (ref 80.0–100.0)
Platelets: 204 10*3/uL (ref 150–400)
RBC: 3.56 MIL/uL — ABNORMAL LOW (ref 3.87–5.11)
RDW: 12.7 % (ref 11.5–15.5)
WBC: 8.4 10*3/uL (ref 4.0–10.5)
nRBC: 0.2 % (ref 0.0–0.2)

## 2024-02-05 LAB — MAGNESIUM: Magnesium: 2 mg/dL (ref 1.7–2.4)

## 2024-02-05 LAB — PHOSPHORUS: Phosphorus: 2.8 mg/dL (ref 2.5–4.6)

## 2024-02-05 NOTE — Progress Notes (Addendum)
 Occupational Therapy Treatment Patient Details Name: Kathryn Morales MRN: [id number] DOB: 09/30/1946 Today's Date: 02/05/2024   History of present illness Patient is a 78 year old female admitted with acute metabolic encephalopathy, acute respiratory failure in setting of suspected pneumonia. Developed severe mucus plugging with completed atelectasis requiring bronchoscopy, septic shock. History of dementia.   OT comments  Chart reviewed to date, pt greeted in bed, oriented to self only, will follow commands with increased time and step by step multi modal cues. Co tx completed with PT on this date- pt with limited tolerance (although improved from evaluation). MAX A +2 required for bed mobility, STS with MAX A +2 via HHA with step by step multi modal cues throughout. Hand over hand required for self feeding, and fading HOH A for washing face at bedside however pt generally requires MAX A. Pt will continue to benefit from acute OT to address functional deficits and to facilitate return to PLOF. It is likely pt will benefit from discharge to familiar environment with OT in familiar setting. OT will continue to follow.       If plan is discharge home, recommend the following:  A lot of help with walking and/or transfers;A lot of help with bathing/dressing/bathroom;Supervision due to cognitive status   Equipment Recommendations  Wheelchair (measurements OT)    Recommendations for Other Services      Precautions / Restrictions Precautions Precautions: Fall Precaution Comments: watch spo2 Restrictions Weight Bearing Restrictions Per Provider Order: No       Mobility Bed Mobility Overal bed mobility: Needs Assistance Bed Mobility: Supine to Sit, Sit to Supine     Supine to sit: Max assist, +2 for physical assistance Sit to supine: Max assist, +2 for physical assistance   General bed mobility comments: step by step multi modal cueing    Transfers Overall transfer level: Needs  assistance Equipment used: 2 person hand held assist Transfers: Sit to/from Stand Sit to Stand: Max assist, +2 physical assistance (multiple attempts)                 Balance Overall balance assessment: Needs assistance Sitting-balance support: Feet supported Sitting balance-Leahy Scale: Poor   Postural control: Posterior lean   Standing balance-Leahy Scale: Poor                             ADL either performed or assessed with clinical judgement   ADL Overall ADL's : Needs assistance/impaired   Eating/Feeding Details (indicate cue type and reason): HOH A to drink OJ Grooming: Sitting;Wash/dry face Grooming Details (indicate cue type and reason): initial HOH A, pt then able to bring hand to face to attempt to wash face             Lower Body Dressing: Maximal assistance Lower Body Dressing Details (indicate cue type and reason): socks                    Extremity/Trunk Assessment              Vision       Perception     Praxis      Cognition Arousal: Alert Behavior During Therapy: Anxious Overall Cognitive Status: No family/caregiver present to determine baseline cognitive functioning Area of Impairment: Orientation, Attention, Memory, Following commands, Safety/judgement, Awareness, Problem solving                 Orientation Level: Disoriented to, Place, Time, Situation (  responds to first name) Current Attention Level: Focused Memory: Decreased short-term memory Following Commands: Follows one step commands with increased time (step by step multi modal cues) Safety/Judgement: Decreased awareness of deficits, Decreased awareness of safety Awareness: Intellectual Problem Solving: Slow processing, Decreased initiation, Difficulty sequencing, Requires verbal cues, Requires tactile cues          Exercises Other Exercises Other Exercises: edu re: role of OT, importance of progress mobility, limited carry over due to  cognitive status    Shoulder Instructions       General Comments spo2 >90% on 6 L via HFNC throughout mobility, intermittent vcs for PLB    Pertinent Vitals/ Pain       Pain Assessment Pain Assessment: PAINAD Breathing: normal Negative Vocalization: none Facial Expression: sad, frightened, frown Body Language: relaxed Consolability: no need to console PAINAD Score: 1 Pain Intervention(s): Monitored during session  Home Living                                          Prior Functioning/Environment              Frequency  Min 1X/week        Progress Toward Goals  OT Goals(current goals can now be found in the care plan section)  Progress towards OT goals: Progressing toward goals  Acute Rehab OT Goals Time For Goal Achievement: 02/17/24  Plan      Co-evaluation    PT/OT/SLP Co-Evaluation/Treatment: Yes Reason for Co-Treatment: Complexity of the patient's impairments (multi-system involvement);Necessary to address cognition/behavior during functional activity;To address functional/ADL transfers;For patient/therapist safety   OT goals addressed during session: ADL's and self-care      AM-PAC OT 6 Clicks Daily Activity     Outcome Measure   Help from another person eating meals?: A Lot Help from another person taking care of personal grooming?: A Lot Help from another person toileting, which includes using toliet, bedpan, or urinal?: A Lot Help from another person bathing (including washing, rinsing, drying)?: A Lot Help from another person to put on and taking off regular upper body clothing?: A Lot Help from another person to put on and taking off regular lower body clothing?: A Lot 6 Click Score: 12    End of Session Equipment Utilized During Treatment: Oxygen  OT Visit Diagnosis: Other abnormalities of gait and mobility (R26.89);Muscle weakness (generalized) (M62.81)   Activity Tolerance Patient tolerated treatment well    Patient Left in bed;with call bell/phone within reach   Nurse Communication Mobility status        Time: 9084-9066 OT Time Calculation (min): 18 min  Charges: OT General Charges $OT Visit: 1 Visit  Therisa Sheffield, OTD OTR/L  02/05/24, 10:58 AM

## 2024-02-05 NOTE — Progress Notes (Signed)
 Physical Therapy Treatment Patient Details Name: Kathryn Morales MRN: [id number] DOB: 1946/11/20 Today's Date: 02/05/2024   History of Present Illness Patient is a 78 year old female admitted with acute metabolic encephalopathy, acute respiratory failure in setting of suspected pneumonia. Developed severe mucus plugging with completed atelectasis requiring bronchoscopy, septic shock. History of dementia.    PT Comments  Patient confused and mildly anxious. She was cooperative with session. She continues to require assistance with bed mobility. Sitting balance initially poor progressing to fair with increased sitting time. She initiated standing x 3 bouts with +2 person assistance required. Unable to progress walking due to poor standing tolerance and generalized weakness. Sp02 remained in the 90's with activity on 6 L02. Recommend to continue PT to maximize independence and decrease caregiver burden.    If plan is discharge home, recommend the following: A little help with walking and/or transfers;A little help with bathing/dressing/bathroom;Assist for transportation;Supervision due to cognitive status   Can travel by private vehicle        Equipment Recommendations  None recommended by PT    Recommendations for Other Services       Precautions / Restrictions Precautions Precautions: Fall Restrictions Weight Bearing Restrictions Per Provider Order: No     Mobility  Bed Mobility Overal bed mobility: Needs Assistance Bed Mobility: Supine to Sit, Sit to Supine     Supine to sit: Max assist, +2 for physical assistance Sit to supine: Max assist, +2 for physical assistance   General bed mobility comments: assistance for trunk and BLE support. cues for task initiation    Transfers Overall transfer level: Needs assistance Equipment used: 2 person hand held assist Transfers: Sit to/from Stand Sit to Stand: Max assist, +2 physical assistance           General transfer comment:  3 standing bouts performed that patient initiated. +2 person assistance is required for safety. cues for anterior weight shifting and technique to facilitate safety and independence    Ambulation/Gait         Gait velocity: not attempted due to poor standing tolerance, generalized weakness         Stairs             Wheelchair Mobility     Tilt Bed    Modified Rankin (Stroke Patients Only)       Balance Overall balance assessment: Needs assistance Sitting-balance support: Feet supported Sitting balance-Leahy Scale: Poor Sitting balance - Comments: poor initially with posterior lean, progressing to fair with increased sitting time. presume some assistance is required due to air bed                                    Cognition Arousal: Alert Behavior During Therapy: Anxious Overall Cognitive Status: History of cognitive impairments - at baseline                                 General Comments: patient remains confused. she is able to follow some single step commands with increased time today. multi modal cues and increased time required        Exercises      General Comments General comments (skin integrity, edema, etc.): Sp02 in the 90's on 6 L02 with sitting and standing activity. occasional cues for breathing techniques      Pertinent Vitals/Pain Pain Assessment Pain  Assessment: No/denies pain    Home Living                          Prior Function            PT Goals (current goals can now be found in the care plan section) Acute Rehab PT Goals Patient Stated Goal: to return to memory care (daughter's goal) PT Goal Formulation: With family Time For Goal Achievement: 02/18/24 Potential to Achieve Goals: Fair Progress towards PT goals: Progressing toward goals    Frequency    Min 1X/week      PT Plan      Co-evaluation              AM-PAC PT 6 Clicks Mobility   Outcome Measure   Help needed turning from your back to your side while in a flat bed without using bedrails?: A Lot Help needed moving from lying on your back to sitting on the side of a flat bed without using bedrails?: A Lot Help needed moving to and from a bed to a chair (including a wheelchair)?: A Lot Help needed standing up from a chair using your arms (e.g., wheelchair or bedside chair)?: A Lot Help needed to walk in hospital room?: Total Help needed climbing 3-5 steps with a railing? : Total 6 Click Score: 10    End of Session   Activity Tolerance: Patient limited by fatigue Patient left: in bed;with call bell/phone within reach;with bed alarm set Nurse Communication: Mobility status PT Visit Diagnosis: Muscle weakness (generalized) (M62.81);Unsteadiness on feet (R26.81)     Time: 9084-9063 PT Time Calculation (min) (ACUTE ONLY): 21 min  Charges:    $Therapeutic Activity: 8-22 mins PT General Charges $$ ACUTE PT VISIT: 1 Visit                     Randine Essex, PT, MPT    Randine LULLA Essex 02/05/2024, 10:41 AM

## 2024-02-05 NOTE — Progress Notes (Signed)
 Speech Language Pathology Treatment: Dysphagia  Patient Details Name: Kathryn Morales MRN: [id number] DOB: [birth year] Today's Date: 02/05/2024 Time: 9144-9084 SLP Time Calculation (min) (ACUTE ONLY): 20 min  Assessment / Plan / Recommendation Clinical Impression  Pt seen for dysphagia intervention targeting trials of advanced solids, skilled observation, and compensatory strategy training. Pt drowsy initially, requiring verbal/tactile cued to achieve alertness for PO intake. NT reporting minimal appetite in last 24 hours. Total assist (bringing cup/straw/utensil to pt's lips) provided. Pt with congested cough noted in the absence of PO and once delayed following completion of regular solids trial. No other s/sx of aspiration. O2 saturations maintained at 93-97 for duration of trials. Oral phase notable for reduced awareness of bolus for both soft solids and regular solids, leading to perseverative mastication pattern and use of liquid wash to ensure oral clearance. Suspect that low endurance, baseline cognitive deficits exacerbated in ICU setting, and lethargy during AM session all impacted oral phase. Recommend continued Dys 2 (fine chop) to aid mastication/oral clearance and conserve energy with thin liquids. Continue with aspiration precautions (slow rate, small bites, elevated HOB, and alert for PO intake). RN aware of recommendations. SLP will continue to follow.     HPI HPI: Pr MD progress note, 78 year old female past medical history of dementia, hypertension. She presents to the hospital with fever, hypoxia and acute metabolic encephalopathy. She has worsening fatigue and malaise over the past 1 to 2 days found to be leaning in her wheelchair to the left side not responding normally. Patient had a fever of 103 in the emergency room. Lumbar puncture was negative. Patient started empirically on antibiotics. Chest XRay 2/4: Cardiomegaly with vascular congestion and probable edema.  Superimposed  pneumonia is not excluded. Head CT 1/30: No acute intracranial abnormality. Chronic cerebral volume loss and white matter changes. Pt currently on 7L nasal canula and on dys 2; thin liquid diet.      SLP Plan  Continue with current plan of care      Recommendations for follow up therapy are one component of a multi-disciplinary discharge planning process, led by the attending physician.  Recommendations may be updated based on patient status, additional functional criteria and insurance authorization.    Recommendations  Diet recommendations: Dysphagia 2 (fine chop);Thin liquid Liquids provided via: Cup;Straw Medication Administration: Crushed with puree Supervision: Full supervision/cueing for compensatory strategies Compensations: Minimize environmental distractions;Slow rate;Small sips/bites Postural Changes and/or Swallow Maneuvers: Seated upright 90 degrees;Upright 30-60 min after meal                  Oral care BID   Frequent or constant Supervision/Assistance Dysphagia, unspecified (R13.10)     Continue with current plan of care    Darrold Bezek Clapp  MS [facility] SLP   Jhaniya Briski J Clapp  02/05/2024, 10:01 AM

## 2024-02-05 NOTE — Progress Notes (Signed)
 NAME:  Kathryn Morales, MRN:  [id number], DOB:  04-10-1946, LOS: 7 ADMISSION DATE:  01/29/2024, CONSULTATION DATE:  02/01/2024 REFERRING MD:  Dr. Josette, CHIEF COMPLAINT:  Hypotension, worsening hypoxia   Brief Pt Description / Synopsis:  78 y.o female, DNR/DNI, with PMHx significant for dementia admitted with Acute Metabolic Encephalopathy and Acute Hypoxic Respiratory Failure in the setting of suspected pneumonia.  Course complicated by severe mucus plugging with complete atelectasis requiring Bronchoscopy and development of septic shock.  History of Present Illness:  This is a 78 yo F with hx of HTN, dementia who came in with acute hypoxemic respiratory failure.  I was unable to get history from patient due to severe hypoxemia on BIPAP and met with her son Seirra Kos and daughter [name].  They explain that she had outpatient treatment with abx and failure of therapy for pneumonia with 2 abx courses over past 2 weeks.  The son and daughter are medical POA for patient.  We reviewed her CT scan with findings of complete atelectasis/collapse of the left lower lobe. Left lower lobe bronchus is completely occluded. Her blood work reveals chronic thrombocytopenia with platelets at 127 appx 1 year ago. She required 80% FiO2 on bipap and had labored breathing.  We discussed code status at length. There was discussion regarding intubation and possible bronchoscopy due to CT chest findings with goal of unplugging of left lung with therapeutic aspiration.  Family wanted to discuss among themselves and share that they wish to have full scope of therapy but keep DNR status.   Please see Significant Hospital Events section below for full detailed hospital course.  Pertinent  Medical History   Past Medical History:  Diagnosis Date   Anxiety    Hyperlipidemia    Hypertension     Micro Data:  1/30: SARS-CoV-2/Flu/RSV PCR>>negative 1/30: Blood culture>> no growth to date 1/30: Meningitis/Encephalitis  panel CSF>> negative 2/2: MRSA PCR>> negative 2/2: BAL>>NGTD  Antimicrobials:   Anti-infectives (From admission, onward)    Start     Dose/Rate Route Frequency Ordered Stop   02/02/24 0700  vancomycin  (VANCOCIN ) IVPB 1000 mg/200 mL premix  Status:  Discontinued        1,000 mg 200 mL/hr over 60 Minutes Intravenous Every 24 hours 02/01/24 0557 02/01/24 1102   02/01/24 1000  doxycycline  (VIBRA -TABS) tablet 100 mg  Status:  Discontinued        100 mg Oral Every 12 hours 01/31/24 1315 02/01/24 0439   02/01/24 1000  doxycycline  (VIBRA -TABS) tablet 100 mg  Status:  Discontinued        100 mg Oral Every 12 hours 02/01/24 0435 02/01/24 0439   02/01/24 0700  piperacillin -tazobactam (ZOSYN ) IVPB 3.375 g        3.375 g 12.5 mL/hr over 240 Minutes Intravenous Every 8 hours 02/01/24 0600 02/05/24 2359   02/01/24 0600  ceFEPIme  (MAXIPIME ) 2 g in sodium chloride  0.9 % 100 mL IVPB  Status:  Discontinued        2 g 200 mL/hr over 30 Minutes Intravenous Every 12 hours 02/01/24 0512 02/01/24 0559   02/01/24 0600  vancomycin  (VANCOREADY) IVPB 1500 mg/300 mL        1,500 mg 150 mL/hr over 120 Minutes Intravenous  Once 02/01/24 0512 02/01/24 0818   02/01/24 0530  doxycycline  (VIBRAMYCIN ) 100 mg in sodium chloride  0.9 % 250 mL IVPB  Status:  Discontinued        100 mg 125 mL/hr over 120 Minutes Intravenous Every 12  hours 02/01/24 0439 02/01/24 0500   01/31/24 1000  azithromycin  (ZITHROMAX ) tablet 250 mg  Status:  Discontinued       Placed in Followed by Linked Group   250 mg Oral Daily 01/30/24 0716 01/30/24 0855   01/30/24 1800  vancomycin  (VANCOCIN ) IVPB 750 mg/150 ml premix  Status:  Discontinued        750 mg 150 mL/hr over 60 Minutes Intravenous Every 24 hours 01/29/24 1711 01/30/24 0949   01/30/24 1800  vancomycin  (VANCOCIN ) IVPB 1000 mg/200 mL premix  Status:  Discontinued        1,000 mg 200 mL/hr over 60 Minutes Intravenous Every 24 hours 01/30/24 0950 01/30/24 0953   01/30/24 1800   Ampicillin -Sulbactam (UNASYN ) 3 g in sodium chloride  0.9 % 100 mL IVPB  Status:  Discontinued        3 g 200 mL/hr over 30 Minutes Intravenous Every 6 hours 01/30/24 0953 02/01/24 0508   01/30/24 1000  azithromycin  (ZITHROMAX ) tablet 500 mg  Status:  Discontinued       Placed in Followed by Linked Group   500 mg Oral Daily 01/30/24 0716 01/30/24 0855   01/30/24 1000  azithromycin  (ZITHROMAX ) 500 mg in sodium chloride  0.9 % 250 mL IVPB  Status:  Discontinued        500 mg 250 mL/hr over 60 Minutes Intravenous Every 24 hours 01/30/24 0855 01/31/24 1315   01/29/24 2200  metroNIDAZOLE  (FLAGYL ) IVPB 500 mg  Status:  Discontinued        500 mg 100 mL/hr over 60 Minutes Intravenous Every 12 hours 01/29/24 1645 01/30/24 0717   01/29/24 2200  ceFEPIme  (MAXIPIME ) 2 g in sodium chloride  0.9 % 100 mL IVPB  Status:  Discontinued        2 g 200 mL/hr over 30 Minutes Intravenous Every 12 hours 01/29/24 1702 01/30/24 0953   01/29/24 1800  vancomycin  (VANCOREADY) IVPB 500 mg/100 mL        500 mg 100 mL/hr over 60 Minutes Intravenous  Once 01/29/24 1704 01/29/24 1903   01/29/24 1000  ceFEPIme  (MAXIPIME ) 2 g in sodium chloride  0.9 % 100 mL IVPB        2 g 200 mL/hr over 30 Minutes Intravenous  Once 01/29/24 0959 01/29/24 1059   01/29/24 1000  metroNIDAZOLE  (FLAGYL ) IVPB 500 mg        500 mg 100 mL/hr over 60 Minutes Intravenous  Once 01/29/24 0959 01/29/24 1207   01/29/24 1000  vancomycin  (VANCOCIN ) IVPB 1000 mg/200 mL premix        1,000 mg 200 mL/hr over 60 Minutes Intravenous  Once 01/29/24 0959 01/29/24 1356      Significant Hospital Events: Including procedures, antibiotic start and stop dates in addition to other pertinent events   1/31.  Antibiotics changed order Unasyn  and Zithromax .  Patient not alert enough this morning to be placed on a diet. 2/1.  Patient's mental status better than yesterday.  Able to be placed back on a diet.  Patient does have a history of dementia.  Will restart oral  medications. 2/2. Transfer to the ICU secondary to increasing oxygen requirements. This morning on 100% oxygen on the BiPAP. 1 dose of Lasix  given with x-ray showing worsening aeration with a mix of airspace disease, volume loss and pulmonary edema. Case discussed with critical care specialist and then patient required to go on Levophed  so critical care team will take over. Intubated and underwent Bronchoscopy, extubated post procedure. 2/3: Weaned off vasopressors.  Weaned from BiPAP to [facility]. Passed Speech evaluation . BAL cultures pending. 2/4: Remains off vasopressors, decrease stress dose steroids.  On 80% FiO2 via HHFNC and tolerating, weaning as able.  Consult PT/OT to mobilize as able. 2/5: No acute events overnight tolerating HHFNC @50L /55% 2/6: Pt weaned to 7L HFNC and tolerating well.  Currently working with PT and oxygenation remains stable.  Will downgrade to medsurg and transfer service to [facility] once a bed becomes available   Interim History / Subjective:  As outlined above in significant hospital events  Objective   Blood pressure 104/65, pulse (!) 47, temperature 98.1 F (36.7 C), temperature source Oral, resp. rate 12, height 5' 2 (1.575 m), weight 54.5 kg, SpO2 94%.    FiO2 (%):  [40 %-60 %] 60 % PEEP:  [5 cmH20] 5 cmH20 Pressure Support:  [5 cmH20] 5 cmH20   Intake/Output Summary (Last 24 hours) at 02/05/2024 9075 Last data filed at 02/05/2024 9081 Gross per 24 hour  Intake 796.55 ml  Output 1850 ml  Net -1053.45 ml   Filed Weights   02/03/24 0500 02/04/24 0600 02/05/24 0500  Weight: 55.7 kg 56.7 kg 54.5 kg    Examination: General: Acute on chronically-ill appearing female, NAD on HFNC HENT: Atraumatic, normocephalic, neck supple, no JVD Lungs: Clear throughout, even, non labored  Cardiovascular: NSR, s1s2, no m/r/g, 2+ radial/2+ distal pulses, no edema  Abdomen: +BS x4, soft, non distended, non tender  Extremities: Normal bulk and tone Neuro: Awake but disoriented  x4 (severe dementia at baseline), intermittently follows commands, PERRLA  GU: External female catheter in place draining yellow urine   Resolved Hospital Problem list   Septic Shock  Assessment & Plan:   #Acute hypoxic respiratory failure in the setting of pneumonia and mucus plugging with atelectasis EXTUBATED 2/2 S/P BRONCHOSCOPY 2/2 - Supplemental O2 as needed to maintain O2 sats >92% - BiPAP qhs (pt is DNR/DNI) - Follow intermittent CXR & ABG's as needed - Continue scheduled and prn bronchodilator therapy  - Hypertonic saline  - IV steroids: wean as tolerated  - Diuresis as BP and renal function permits - Pulmonary toilet as able  #Septic shock ~ RESOLVED #Elevated troponin, suspect demand ischemia - Continuous telemetry monitoring  - Maintain MAP 65 or higher  - Stress steroids discontinued 02/6 - Lactic acid is normalized - HS Troponin peaked at 89 - Diuresis as BP and renal function permits  #Severe sepsis (Met SIRS Criteria: fever and tachycardia) #Pneumonia - Trend WBC and monitor fever curve  - Tend PCT  - Follow cultures  - Continue empiric zosyn  pending cultures & sensitivities  #Hypokalemia - Trend BMP  - Replace electrolytes as indicated  - Strict I&O's  #Mild thrombocytopenia~resolved  #Anemia without signs of obvious bleeding  - Trend CBC  - Monitor for s/sx of bleeding  - Transfuse for hgb <7 - VTE px: subcutaneous lovenox    #Acute metabolic encephalopathy #Dementia - Treat metabolic derangements  - Provide supportive care - Promote normal sleep/wake cycle and family presence - Avoid sedating medications as able - Continue aricept  - PT/OT   Best Practice (right click and Reselect all SmartList Selections daily)   Diet/type: DYS 2 DVT prophylaxis: Lovenox  GI prophylaxis: N/A Lines: N/A Foley:  N/A Code Status:  DNR Last date of multidisciplinary goals of care discussion [02/05/24]  2/6: Will update pt's daughter when she arrives at  bedside   Labs   CBC: Recent Labs  Lab 01/29/24 1004 01/30/24 0437 02/01/24 0459 02/02/24 0334  02/03/24 0421 02/04/24 0416 02/05/24 0408  WBC 6.5   < > 5.9 6.0 9.2 8.7 8.4  NEUTROABS 4.7  --  4.6  --   --   --   --   HGB 14.0   < > 11.8* 10.6* 10.2* 9.8* 11.4*  HCT 42.3   < > 35.9* 31.3* 30.5* 28.6* 33.6*  MCV 96.8   < > 98.4 95.7 95.6 94.4 94.4  PLT 161   < > 135* 135* 149* 161 204   < > = values in this interval not displayed.    Basic Metabolic Panel: Recent Labs  Lab 02/01/24 0459 02/02/24 0334 02/02/24 1129 02/03/24 0421 02/04/24 0416 02/04/24 1955 02/05/24 0408  NA 144 140  --  140 139  --  137  K 4.2 3.4*  --  3.4* 2.8* 3.0* 3.8  CL 101 99  --  99 97*  --  100  CO2 28 32  --  30 29  --  30  GLUCOSE 105* 146*  --  146* 120*  --  90  BUN 19 24*  --  35* 28*  --  16  CREATININE 0.83 0.83  --  0.96 0.82  --  0.72  CALCIUM 8.7* 8.0*  --  8.3* 8.1*  --  8.2*  MG 1.8 2.1  --  2.3 2.2  --  2.0  PHOS  --  3.1 3.4 2.8 3.2  --  2.8   GFR: Estimated Creatinine Clearance: 46.6 mL/min (by C-G formula based on SCr of 0.72 mg/dL). Recent Labs  Lab 01/29/24 0959 01/29/24 1004 01/29/24 1212 01/29/24 1819 01/30/24 0437 01/31/24 0342 02/01/24 0459 02/02/24 0334 02/03/24 0421 02/04/24 0416 02/05/24 0408  PROCALCITON  --   --   --  <0.10  --  2.03 1.59  --   --   --   --   WBC  --    < >  --   --    < > 6.5 5.9 6.0 9.2 8.7 8.4  LATICACIDVEN 1.7  --  1.4  --   --   --   --   --   --   --   --    < > = values in this interval not displayed.    Liver Function Tests: Recent Labs  Lab 01/29/24 0946 01/30/24 0437 02/01/24 0459  AST 27 52* 70*  ALT 16 21 28   ALKPHOS 45 38 39  BILITOT 0.9 0.8 0.6  PROT 7.1 6.3* 5.9*  ALBUMIN 4.0 3.4* 2.8*   No results for input(s): LIPASE, AMYLASE in the last 168 hours. No results for input(s): AMMONIA in the last 168 hours.  ABG    Component Value Date/Time   HCO3 35.1 (H) 01/29/2024 1007   O2SAT 36.4 01/29/2024  1007     Coagulation Profile: Recent Labs  Lab 01/29/24 0946 01/29/24 1004  INR 1.0 1.0    Cardiac Enzymes: Recent Labs  Lab 01/31/24 0341  CKTOTAL 1,709*    HbA1C: No results found for: HGBA1C  CBG: Recent Labs  Lab 02/04/24 1555 02/04/24 1945 02/04/24 2325 02/05/24 0404 02/05/24 0739  GLUCAP 116* 105* 97 87 91    Review of Systems:   Unable to assess due to AMS and dementia   Past Medical History:  She,  has a past medical history of Anxiety, Hyperlipidemia, and Hypertension.   Surgical History:   Past Surgical History:  Procedure Laterality Date   BREAST SURGERY     TONSILLECTOMY AND ADENOIDECTOMY  TUBAL LIGATION       Social History:   reports that she has never smoked. She has never used smokeless tobacco. She reports current alcohol use. She reports that she does not use drugs.   Family History:  Her family history includes Anxiety disorder in her daughter; Asthma in her maternal grandfather; Breast cancer in her paternal grandmother; Coronary artery disease in her mother; Diabetes in her brother; Heart attack in her paternal grandfather; Heart disease in her mother; Hypothyroidism in her daughter and maternal grandmother; Lupus in her sister.   Allergies No Known Allergies   Home Medications  Prior to Admission medications   Medication Sig Start Date End Date Taking? Authorizing Provider  ARIPiprazole  (ABILIFY ) 2 MG tablet Take 1 tablet (2 mg total) by mouth at bedtime. Patient taking differently: Take 2 mg by mouth daily. 06/26/18  Yes Chrismon, Marinda BRAVO, PA-C  cholecalciferol (VITAMIN D3) 25 MCG (1000 UNIT) tablet Take 2,000 Units by mouth daily.   Yes [provider]  divalproex  (DEPAKOTE  SPRINKLE) 125 MG capsule Take 125 mg by mouth 2 (two) times daily. 01/21/24  Yes [provider]  donepezil  (ARICEPT ) 10 MG tablet Take 1 tablet (10 mg total) by mouth at bedtime. Patient taking differently: Take 10 mg by mouth in the  morning. 01/10/17  Yes Chrismon, Marinda BRAVO, PA-C  hydrochlorothiazide (MICROZIDE) 12.5 MG capsule Take 12.5 mg by mouth daily. 01/21/24  Yes [provider]  melatonin 5 MG TABS Take 5 mg by mouth at bedtime.   Yes [provider]  sertraline  (ZOLOFT ) 50 MG tablet Take 75 mg by mouth daily. 01/21/24  Yes [provider]  simvastatin  (ZOCOR ) 40 MG tablet Take 1 tablet (40 mg total) by mouth at bedtime. 07/22/16  Yes Chrismon, Marinda BRAVO, PA-C     Critical care time: 36 minutes    Lonell Moose, AGNP  Pulmonary/Critical Care Pager [phone] (please enter 7 digits) PCCM Consult Pager [phone] (please enter 7 digits)

## 2024-02-05 NOTE — Progress Notes (Signed)
 Updated pts daughter Kathryn Morales via telephone regarding pts condition and current plan of care.  All questions were answered.  Will continue to monitor and assess pt.  Kathryn Morales, AGNP  Pulmonary/Critical Care Pager [phone] (please enter 7 digits) PCCM Consult Pager [phone] (please enter 7 digits)

## 2024-02-06 LAB — CBC
HCT: 33 % — ABNORMAL LOW (ref 36.0–46.0)
Hemoglobin: 11.3 g/dL — ABNORMAL LOW (ref 12.0–15.0)
MCH: 32.5 pg (ref 26.0–34.0)
MCHC: 34.2 g/dL (ref 30.0–36.0)
MCV: 94.8 fL (ref 80.0–100.0)
Platelets: 227 10*3/uL (ref 150–400)
RBC: 3.48 MIL/uL — ABNORMAL LOW (ref 3.87–5.11)
RDW: 13.1 % (ref 11.5–15.5)
WBC: 8.2 10*3/uL (ref 4.0–10.5)
nRBC: 0.4 % — ABNORMAL HIGH (ref 0.0–0.2)

## 2024-02-06 LAB — GLUCOSE, CAPILLARY
Glucose-Capillary: 100 mg/dL — ABNORMAL HIGH (ref 70–99)
Glucose-Capillary: 108 mg/dL — ABNORMAL HIGH (ref 70–99)
Glucose-Capillary: 101 mg/dL — ABNORMAL HIGH (ref 70–99)
Glucose-Capillary: 101 mg/dL — ABNORMAL HIGH (ref 70–99)
Glucose-Capillary: 117 mg/dL — ABNORMAL HIGH (ref 70–99)

## 2024-02-06 LAB — BLOOD GAS, ARTERIAL
Acid-Base Excess: 8.7 mmol/L — ABNORMAL HIGH (ref 0.0–2.0)
Bicarbonate: 32.8 mmol/L — ABNORMAL HIGH (ref 20.0–28.0)
O2 Content: 4 L/min
O2 Saturation: 92.4 %
Patient temperature: 37
pCO2 arterial: 42 mm[Hg] (ref 32–48)
pH, Arterial: 7.5 — ABNORMAL HIGH (ref 7.35–7.45)
pO2, Arterial: 62 mm[Hg] — ABNORMAL LOW (ref 83–108)

## 2024-02-06 LAB — BASIC METABOLIC PANEL
Anion gap: 12 (ref 5–15)
BUN: 18 mg/dL (ref 8–23)
CO2: 30 mmol/L (ref 22–32)
Calcium: 8.6 mg/dL — ABNORMAL LOW (ref 8.9–10.3)
Chloride: 96 mmol/L — ABNORMAL LOW (ref 98–111)
Creatinine, Ser: 0.86 mg/dL (ref 0.44–1.00)
GFR, Estimated: >60 mL/min (ref >60)
Glucose, Bld: 109 mg/dL — ABNORMAL HIGH (ref 70–99)
Potassium: 3.7 mmol/L (ref 3.5–5.1)
Sodium: 138 mmol/L (ref 135–145)

## 2024-02-06 LAB — BLOOD GAS, VENOUS
Acid-Base Excess: 9.9 mmol/L — ABNORMAL HIGH (ref 0.0–2.0)
Bicarbonate: 36 mmol/L — ABNORMAL HIGH (ref 20.0–28.0)
O2 Saturation: 52.8 %
Patient temperature: 37
pCO2, Ven: 53 mm[Hg] (ref 44–60)
pH, Ven: 7.44 — ABNORMAL HIGH (ref 7.25–7.43)
pO2, Ven: 32 mm[Hg] (ref 32–45)

## 2024-02-06 LAB — PHOSPHORUS: Phosphorus: 4.3 mg/dL (ref 2.5–4.6)

## 2024-02-06 LAB — MAGNESIUM: Magnesium: 2.1 mg/dL (ref 1.7–2.4)

## 2024-02-06 NOTE — Plan of Care (Signed)

## 2024-02-06 NOTE — Progress Notes (Signed)
 Occupational Therapy Treatment Patient Details Name: Kathryn Morales MRN: [id number] DOB: 1946-09-08 Today's Date: 02/06/2024   History of present illness Patient is a 78 year old female admitted with acute metabolic encephalopathy, acute respiratory failure in setting of suspected pneumonia. Developed severe mucus plugging with completed atelectasis requiring bronchoscopy, septic shock. History of dementia.   OT comments  Ms Seifried was seen for OT/PT co-treatment on this date. Upon arrival to room pt asleep in bed, arouses with touch and mobility. Pt requires MAX A x2 bed mobility. MAX A x2 + HHA bed<>chair with ~70ft x2 steps. MAX A hand over hand face washing and hair brushing in sitting. Further mobility and ADL participation limited by cognition. Pt making progress toward goals, will continue to follow POC. Discharge recommendation remains appropriate.        If plan is discharge home, recommend the following:  A lot of help with walking and/or transfers;A lot of help with bathing/dressing/bathroom;Supervision due to cognitive status   Equipment Recommendations  Wheelchair (measurements OT)    Recommendations for Other Services      Precautions / Restrictions Precautions Precautions: Fall Restrictions Weight Bearing Restrictions Per Provider Order: No       Mobility Bed Mobility Overal bed mobility: Needs Assistance Bed Mobility: Supine to Sit, Sit to Supine     Supine to sit: Max assist, +2 for physical assistance Sit to supine: Max assist, +2 for physical assistance   General bed mobility comments: +2 person assistance required for bed mobility. cues for sequencing and task initiation    Transfers Overall transfer level: Needs assistance Equipment used: 2 person hand held assist Transfers: Sit to/from Stand, Bed to chair/wheelchair/BSC Sit to Stand: Max assist, +2 physical assistance     Step pivot transfers: Max assist, +2 physical assistance     General  transfer comment: several standing bouts performed. patient does initiate standing.     Balance Overall balance assessment: Needs assistance Sitting-balance support: Feet supported Sitting balance-Leahy Scale: Poor Sitting balance - Comments: poor initially with posterior lean with periods of CGA with increased activity Postural control: Posterior lean (with left gaze preference and head turn) Standing balance support: Bilateral upper extremity supported Standing balance-Leahy Scale: Poor Standing balance comment: external support required to maintain standing balance                           ADL either performed or assessed with clinical judgement   ADL Overall ADL's : Needs assistance/impaired                                       General ADL Comments: MAX A hand over hand face washing and hair brushing in sitting. MAX A simulated BSC t/f, pt limited by cognition.      Cognition Arousal: Lethargic Behavior During Therapy: WFL for tasks assessed/performed Overall Cognitive Status: History of cognitive impairments - at baseline Area of Impairment: Orientation, Following commands, Safety/judgement, Awareness, Problem solving                 Orientation Level:  (responds to her name) Current Attention Level: Focused Memory: Decreased short-term memory Following Commands: Follows one step commands with increased time (multi modal cues required) Safety/Judgement: Decreased awareness of deficits, Decreased awareness of safety   Problem Solving: Slow processing, Decreased initiation, Difficulty sequencing, Requires verbal cues, Requires tactile cues General Comments:  initially lethargic with increased arousal with mobility efforts. maintains prefered L gaze        Exercises      Shoulder Instructions       General Comments patient up to the recliner chair for part of the session.    Pertinent Vitals/ Pain       Pain Assessment Pain  Assessment: No/denies pain   Frequency  Min 1X/week        Progress Toward Goals  OT Goals(current goals can now be found in the care plan section)  Progress towards OT goals: Progressing toward goals  Acute Rehab OT Goals OT Goal Formulation: With family Time For Goal Achievement: 02/17/24 Potential to Achieve Goals: Fair ADL Goals Pt Will Perform Grooming: sitting;with set-up;with supervision Pt Will Perform Lower Body Dressing: with min assist;with caregiver independent in assisting;sit to/from stand Pt Will Transfer to Toilet: with contact guard assist;ambulating;regular height toilet  Plan      Co-evaluation    PT/OT/SLP Co-Evaluation/Treatment: Yes Reason for Co-Treatment: Complexity of the patient's impairments (multi-system involvement);Necessary to address cognition/behavior during functional activity;To address functional/ADL transfers;For patient/therapist safety PT goals addressed during session: Mobility/safety with mobility OT goals addressed during session: ADL's and self-care      AM-PAC OT 6 Clicks Daily Activity     Outcome Measure   Help from another person eating meals?: A Lot Help from another person taking care of personal grooming?: A Lot Help from another person toileting, which includes using toliet, bedpan, or urinal?: A Lot Help from another person bathing (including washing, rinsing, drying)?: A Lot Help from another person to put on and taking off regular upper body clothing?: A Lot Help from another person to put on and taking off regular lower body clothing?: A Lot 6 Click Score: 12    End of Session    OT Visit Diagnosis: Other abnormalities of gait and mobility (R26.89);Muscle weakness (generalized) (M62.81)   Activity Tolerance Patient tolerated treatment well   Patient Left in bed;with call bell/phone within reach;with bed alarm set   Nurse Communication Mobility status        Time: 8641-8576 OT Time Calculation (min):  25 min  Charges: OT General Charges $OT Visit: 1 Visit OT Treatments $Self Care/Home Management : 8-22 mins  Elston Slot, M.S. OTR/L  02/06/24, 3:49 PM  ascom [phone]

## 2024-02-06 NOTE — Plan of Care (Signed)
  Problem: Fluid Volume: Goal: Hemodynamic stability will improve Outcome: Progressing   Problem: Clinical Measurements: Goal: Diagnostic test results will improve Outcome: Progressing   Problem: Clinical Measurements: Goal: Signs and symptoms of infection will decrease Outcome: Progressing   Problem: Respiratory: Goal: Ability to maintain adequate ventilation will improve Outcome: Progressing   Problem: Education: Goal: Knowledge of General Education information will improve Description: Including pain rating scale, medication(s)/side effects and non-pharmacologic comfort measures Outcome: Not Progressing   Problem: Health Behavior/Discharge Planning: Goal: Ability to manage health-related needs will improve Outcome: Not Progressing   Problem: Health Behavior/Discharge Planning: Goal: Ability to manage health-related needs will improve Outcome: Not Progressing   Problem: Health Behavior/Discharge Planning: Goal: Ability to manage health-related needs will improve Outcome: Not Progressing

## 2024-02-06 NOTE — Progress Notes (Signed)
 NAME:  Kathryn Morales, MRN:  [id number], DOB:  1946-01-15, LOS: 8 ADMISSION DATE:  01/29/2024, CONSULTATION DATE:  02/01/2024 REFERRING MD:  Dr. Josette, CHIEF COMPLAINT:  Hypotension, worsening hypoxia   Brief Pt Description / Synopsis:  78 y.o female, DNR/DNI, with PMHx significant for dementia admitted with Acute Metabolic Encephalopathy and Acute Hypoxic Respiratory Failure in the setting of suspected pneumonia.  Course complicated by severe mucus plugging with complete atelectasis requiring Bronchoscopy and development of septic shock.  History of Present Illness:  This is a 78 yo F with hx of HTN, dementia who came in with acute hypoxemic respiratory failure.  I was unable to get history from patient due to severe hypoxemia on BIPAP and met with her son Anelly Samarin and daughter [name].  They explain that she had outpatient treatment with abx and failure of therapy for pneumonia with 2 abx courses over past 2 weeks.  The son and daughter are medical POA for patient.  We reviewed her CT scan with findings of complete atelectasis/collapse of the left lower lobe. Left lower lobe bronchus is completely occluded. Her blood work reveals chronic thrombocytopenia with platelets at 127 appx 1 year ago. She required 80% FiO2 on bipap and had labored breathing.  We discussed code status at length. There was discussion regarding intubation and possible bronchoscopy due to CT chest findings with goal of unplugging of left lung with therapeutic aspiration.  Family wanted to discuss among themselves and share that they wish to have full scope of therapy but keep DNR status.   Please see Significant Hospital Events section below for full detailed hospital course.  Pertinent  Medical History   Past Medical History:  Diagnosis Date   Anxiety    Hyperlipidemia    Hypertension     Micro Data:  1/30: SARS-CoV-2/Flu/RSV PCR>>negative 1/30: Blood culture>> no growth to date 1/30: Meningitis/Encephalitis  panel CSF>> negative 2/2: MRSA PCR>> negative 2/2: BAL>>No growth  Antimicrobials:   Anti-infectives (From admission, onward)    Start     Dose/Rate Route Frequency Ordered Stop   02/02/24 0700  vancomycin  (VANCOCIN ) IVPB 1000 mg/200 mL premix  Status:  Discontinued        1,000 mg 200 mL/hr over 60 Minutes Intravenous Every 24 hours 02/01/24 0557 02/01/24 1102   02/01/24 1000  doxycycline  (VIBRA -TABS) tablet 100 mg  Status:  Discontinued        100 mg Oral Every 12 hours 01/31/24 1315 02/01/24 0439   02/01/24 1000  doxycycline  (VIBRA -TABS) tablet 100 mg  Status:  Discontinued        100 mg Oral Every 12 hours 02/01/24 0435 02/01/24 0439   02/01/24 0700  piperacillin -tazobactam (ZOSYN ) IVPB 3.375 g        3.375 g 12.5 mL/hr over 240 Minutes Intravenous Every 8 hours 02/01/24 0600 02/05/24 2359   02/01/24 0600  ceFEPIme  (MAXIPIME ) 2 g in sodium chloride  0.9 % 100 mL IVPB  Status:  Discontinued        2 g 200 mL/hr over 30 Minutes Intravenous Every 12 hours 02/01/24 0512 02/01/24 0559   02/01/24 0600  vancomycin  (VANCOREADY) IVPB 1500 mg/300 mL        1,500 mg 150 mL/hr over 120 Minutes Intravenous  Once 02/01/24 0512 02/01/24 0818   02/01/24 0530  doxycycline  (VIBRAMYCIN ) 100 mg in sodium chloride  0.9 % 250 mL IVPB  Status:  Discontinued        100 mg 125 mL/hr over 120 Minutes Intravenous Every  12 hours 02/01/24 0439 02/01/24 0500   01/31/24 1000  azithromycin  (ZITHROMAX ) tablet 250 mg  Status:  Discontinued       Placed in Followed by Linked Group   250 mg Oral Daily 01/30/24 0716 01/30/24 0855   01/30/24 1800  vancomycin  (VANCOCIN ) IVPB 750 mg/150 ml premix  Status:  Discontinued        750 mg 150 mL/hr over 60 Minutes Intravenous Every 24 hours 01/29/24 1711 01/30/24 0949   01/30/24 1800  vancomycin  (VANCOCIN ) IVPB 1000 mg/200 mL premix  Status:  Discontinued        1,000 mg 200 mL/hr over 60 Minutes Intravenous Every 24 hours 01/30/24 0950 01/30/24 0953   01/30/24 1800   Ampicillin -Sulbactam (UNASYN ) 3 g in sodium chloride  0.9 % 100 mL IVPB  Status:  Discontinued        3 g 200 mL/hr over 30 Minutes Intravenous Every 6 hours 01/30/24 0953 02/01/24 0508   01/30/24 1000  azithromycin  (ZITHROMAX ) tablet 500 mg  Status:  Discontinued       Placed in Followed by Linked Group   500 mg Oral Daily 01/30/24 0716 01/30/24 0855   01/30/24 1000  azithromycin  (ZITHROMAX ) 500 mg in sodium chloride  0.9 % 250 mL IVPB  Status:  Discontinued        500 mg 250 mL/hr over 60 Minutes Intravenous Every 24 hours 01/30/24 0855 01/31/24 1315   01/29/24 2200  metroNIDAZOLE  (FLAGYL ) IVPB 500 mg  Status:  Discontinued        500 mg 100 mL/hr over 60 Minutes Intravenous Every 12 hours 01/29/24 1645 01/30/24 0717   01/29/24 2200  ceFEPIme  (MAXIPIME ) 2 g in sodium chloride  0.9 % 100 mL IVPB  Status:  Discontinued        2 g 200 mL/hr over 30 Minutes Intravenous Every 12 hours 01/29/24 1702 01/30/24 0953   01/29/24 1800  vancomycin  (VANCOREADY) IVPB 500 mg/100 mL        500 mg 100 mL/hr over 60 Minutes Intravenous  Once 01/29/24 1704 01/29/24 1903   01/29/24 1000  ceFEPIme  (MAXIPIME ) 2 g in sodium chloride  0.9 % 100 mL IVPB        2 g 200 mL/hr over 30 Minutes Intravenous  Once 01/29/24 0959 01/29/24 1059   01/29/24 1000  metroNIDAZOLE  (FLAGYL ) IVPB 500 mg        500 mg 100 mL/hr over 60 Minutes Intravenous  Once 01/29/24 0959 01/29/24 1207   01/29/24 1000  vancomycin  (VANCOCIN ) IVPB 1000 mg/200 mL premix        1,000 mg 200 mL/hr over 60 Minutes Intravenous  Once 01/29/24 0959 01/29/24 1356      Significant Hospital Events: Including procedures, antibiotic start and stop dates in addition to other pertinent events   1/31.  Antibiotics changed order Unasyn  and Zithromax .  Patient not alert enough this morning to be placed on a diet. 2/1.  Patient's mental status better than yesterday.  Able to be placed back on a diet.  Patient does have a history of dementia.  Will restart oral  medications. 2/2. Transfer to the ICU secondary to increasing oxygen requirements. This morning on 100% oxygen on the BiPAP. 1 dose of Lasix  given with x-ray showing worsening aeration with a mix of airspace disease, volume loss and pulmonary edema. Case discussed with critical care specialist and then patient required to go on Levophed  so critical care team will take over. Intubated and underwent Bronchoscopy, extubated post procedure. 2/3: Weaned off vasopressors.  Weaned from BiPAP to [facility]. Passed Speech evaluation . BAL cultures pending. 2/4: Remains off vasopressors, decrease stress dose steroids.  On 80% FiO2 via HHFNC and tolerating, weaning as able.  Consult PT/OT to mobilize as able. 2/5: No acute events overnight tolerating HHFNC @50L /55% 2/6: Pt weaned to 7L HFNC and tolerating well.  Currently working with PT and oxygenation remains stable.  Will downgrade to medsurg and transfer service to [facility] once a bed becomes available  2/7: No events noted overnight, Remains stable, tolerated BiPAP overnight.  Weaning back to HFNC, was placed on 4L by nursing.  Continue to wean down FiO2 as able.  Awaiting transfer out to med-surg unit, considering discharge back to Nursing Facility with supplemental oxygen.  Interim History / Subjective:  As outlined above in significant hospital events  Objective   Blood pressure 104/63, pulse 72, temperature 99.4 F (37.4 C), temperature source Axillary, resp. rate 14, height 5' 2 (1.575 m), weight 54.7 kg, SpO2 99%.    FiO2 (%):  [60 %] 60 % PEEP:  [5 cmH20] 5 cmH20 Pressure Support:  [5 cmH20] 5 cmH20   Intake/Output Summary (Last 24 hours) at 02/06/2024 0754 Last data filed at 02/06/2024 0600 Gross per 24 hour  Intake 450 ml  Output 1100 ml  Net -650 ml   Filed Weights   02/04/24 0600 02/05/24 0500 02/06/24 0500  Weight: 56.7 kg 54.5 kg 54.7 kg    Examination: General: Acute on chronically-ill appearing female, NAD on HFNC HENT: Atraumatic,  normocephalic, neck supple, no JVD Lungs: Clear throughout, even, non labored  Cardiovascular: NSR, s1s2, no m/r/g, 2+ radial/2+ distal pulses, no edema  Abdomen: +BS x4, soft, non distended, non tender  Extremities: Normal bulk and tone Neuro: Awake but disoriented x4 (severe dementia at baseline), intermittently follows commands, PERRLA  GU: External female catheter in place draining yellow urine   Resolved Hospital Problem list   Septic Shock  Assessment & Plan:   #Acute hypoxic respiratory failure in the setting of pneumonia and mucus plugging with atelectasis S/P BRONCHOSCOPY 2/2 ~ EXTUBATED POST PROCEDURE - Supplemental O2 as needed to maintain O2 sats >92% - BiPAP qhs (pt is DNR/DNI) - Follow intermittent CXR & ABG's as needed - Continue scheduled and prn bronchodilator therapy - IV steroids weaned off  - Diuresis as BP and renal function permits - Pulmonary toilet as able - Completed course of ABX as outlined above  #Septic shock ~ RESOLVED #Elevated troponin, suspect demand ischemia - Continuous telemetry monitoring  - Maintain MAP 65 or higher  - Stress steroids discontinued 02/6 - Lactic acid is normalized - HS Troponin peaked at 89 - Diuresis as BP and renal function permits  #Severe sepsis (Met SIRS Criteria: fever and tachycardia) ~ RESOLVED #Pneumonia ~ TREATED - Trend WBC and monitor fever curve  - Tend PCT  - Follow cultures  - Completed 5 day course of Zosyn   #Hypokalemia ~ RESOLVED -Monitor I&O's / urinary output -Follow BMP -Ensure adequate renal perfusion -Avoid nephrotoxic agents as able -Replace electrolytes as indicated ~ Pharmacy following for assistance with electrolyte replacement  #Mild thrombocytopenia~resolved  #Anemia without signs of obvious bleeding  - Trend CBC  - Monitor for s/sx of bleeding  - Transfuse for hgb <7 - VTE px: subcutaneous lovenox    #Acute metabolic encephalopathy #Dementia - Treat metabolic derangements  -  Provide supportive care - Promote normal sleep/wake cycle and family presence - Avoid sedating medications as able - Continue aricept  - PT/OT  Best Practice (right click and Reselect all SmartList Selections daily)   Diet/type: DYS 2 DVT prophylaxis: Lovenox  GI prophylaxis: N/A Lines: N/A Foley:  N/A Code Status:  DNR Last date of multidisciplinary goals of care discussion [02/06/24]  2/7: Will update pt's daughter when she arrives at bedside   Labs   CBC: Recent Labs  Lab 02/01/24 0459 02/02/24 0334 02/03/24 0421 02/04/24 0416 02/05/24 0408 02/06/24 0520  WBC 5.9 6.0 9.2 8.7 8.4 8.2  NEUTROABS 4.6  --   --   --   --   --   HGB 11.8* 10.6* 10.2* 9.8* 11.4* 11.3*  HCT 35.9* 31.3* 30.5* 28.6* 33.6* 33.0*  MCV 98.4 95.7 95.6 94.4 94.4 94.8  PLT 135* 135* 149* 161 204 227    Basic Metabolic Panel: Recent Labs  Lab 02/02/24 0334 02/02/24 1129 02/03/24 0421 02/04/24 0416 02/04/24 1955 02/05/24 0408 02/06/24 0520  NA 140  --  140 139  --  137 138  K 3.4*  --  3.4* 2.8* 3.0* 3.8 3.7  CL 99  --  99 97*  --  100 96*  CO2 32  --  30 29  --  30 30  GLUCOSE 146*  --  146* 120*  --  90 109*  BUN 24*  --  35* 28*  --  16 18  CREATININE 0.83  --  0.96 0.82  --  0.72 0.86  CALCIUM 8.0*  --  8.3* 8.1*  --  8.2* 8.6*  MG 2.1  --  2.3 2.2  --  2.0 2.1  PHOS 3.1 3.4 2.8 3.2  --  2.8 4.3   GFR: Estimated Creatinine Clearance: 43.3 mL/min (by C-G formula based on SCr of 0.86 mg/dL). Recent Labs  Lab 01/31/24 0342 02/01/24 0459 02/02/24 0334 02/03/24 0421 02/04/24 0416 02/05/24 0408 02/06/24 0520  PROCALCITON 2.03 1.59  --   --   --   --   --   WBC 6.5 5.9   < > 9.2 8.7 8.4 8.2   < > = values in this interval not displayed.    Liver Function Tests: Recent Labs  Lab 02/01/24 0459  AST 70*  ALT 28  ALKPHOS 39  BILITOT 0.6  PROT 5.9*  ALBUMIN 2.8*   No results for input(s): LIPASE, AMYLASE in the last 168 hours. No results for input(s): AMMONIA in  the last 168 hours.  ABG    Component Value Date/Time   HCO3 35.1 (H) 01/29/2024 1007   O2SAT 36.4 01/29/2024 1007     Coagulation Profile: No results for input(s): INR, PROTIME in the last 168 hours.   Cardiac Enzymes: Recent Labs  Lab 01/31/24 0341  CKTOTAL 1,709*    HbA1C: No results found for: HGBA1C  CBG: Recent Labs  Lab 02/05/24 1545 02/05/24 1959 02/05/24 2354 02/06/24 0402 02/06/24 0729  GLUCAP 125* 124* 107* 100* 108*    Review of Systems:   Unable to assess due to AMS and dementia   Past Medical History:  She,  has a past medical history of Anxiety, Hyperlipidemia, and Hypertension.   Surgical History:   Past Surgical History:  Procedure Laterality Date   BREAST SURGERY     TONSILLECTOMY AND ADENOIDECTOMY     TUBAL LIGATION       Social History:   reports that she has never smoked. She has never used smokeless tobacco. She reports current alcohol use. She reports that she does not use drugs.   Family History:  Her family  history includes Anxiety disorder in her daughter; Asthma in her maternal grandfather; Breast cancer in her paternal grandmother; Coronary artery disease in her mother; Diabetes in her brother; Heart attack in her paternal grandfather; Heart disease in her mother; Hypothyroidism in her daughter and maternal grandmother; Lupus in her sister.   Allergies No Known Allergies   Home Medications  Prior to Admission medications   Medication Sig Start Date End Date Taking? Authorizing Provider  ARIPiprazole  (ABILIFY ) 2 MG tablet Take 1 tablet (2 mg total) by mouth at bedtime. Patient taking differently: Take 2 mg by mouth daily. 06/26/18  Yes Chrismon, Marinda BRAVO, PA-C  cholecalciferol (VITAMIN D3) 25 MCG (1000 UNIT) tablet Take 2,000 Units by mouth daily.   Yes [provider]  divalproex  (DEPAKOTE  SPRINKLE) 125 MG capsule Take 125 mg by mouth 2 (two) times daily. 01/21/24  Yes [provider]  donepezil   (ARICEPT ) 10 MG tablet Take 1 tablet (10 mg total) by mouth at bedtime. Patient taking differently: Take 10 mg by mouth in the morning. 01/10/17  Yes Chrismon, Marinda BRAVO, PA-C  hydrochlorothiazide (MICROZIDE) 12.5 MG capsule Take 12.5 mg by mouth daily. 01/21/24  Yes [provider]  melatonin 5 MG TABS Take 5 mg by mouth at bedtime.   Yes [provider]  sertraline  (ZOLOFT ) 50 MG tablet Take 75 mg by mouth daily. 01/21/24  Yes [provider]  simvastatin  (ZOCOR ) 40 MG tablet Take 1 tablet (40 mg total) by mouth at bedtime. 07/22/16  Yes Chrismon, Marinda BRAVO, PA-C     Critical care time: 36 minutes    Inge Lecher, AGACNP-BC [facility] Prefer epic messenger for cross cover needs If after hours, please call E-link

## 2024-02-06 NOTE — Progress Notes (Signed)
 Physical Therapy Treatment Patient Details Name: Kathryn Morales MRN: [id number] DOB: 1946/03/19 Today's Date: 02/06/2024   History of Present Illness Patient is a 78 year old female admitted with acute metabolic encephalopathy, acute respiratory failure in setting of suspected pneumonia. Developed severe mucus plugging with completed atelectasis requiring bronchoscopy, septic shock. History of dementia.    PT Comments  The patient is lethargic initially with increased alertness with progression of activity. The patient walked today, 92ft with +2 person assistance. She sat up in the chair briefly to participate with ADL activity. Sp02 remained in the 90's on 2 L02 with activity. She continues to require assistance with bed mobility and transfers. Due to her baseline cognition and living in memory care prior to this hospital stay, patient would likely do better with return to familiar environment with staff assistance and therapy follow up if possible. PT will continue to follow while in the hospital.    If plan is discharge home, recommend the following: A little help with walking and/or transfers;A little help with bathing/dressing/bathroom;Assist for transportation;Supervision due to cognitive status   Can travel by private vehicle        Equipment Recommendations  None recommended by PT    Recommendations for Other Services       Precautions / Restrictions Precautions Precautions: Fall Restrictions Weight Bearing Restrictions Per Provider Order: No     Mobility  Bed Mobility Overal bed mobility: Needs Assistance Bed Mobility: Supine to Sit, Sit to Supine     Supine to sit: Max assist, +2 for physical assistance Sit to supine: Max assist, +2 for physical assistance   General bed mobility comments: +2 person assistance required for bed mobility. cues for sequencing and task initiation    Transfers Overall transfer level: Needs assistance Equipment used: 2 person hand held  assist Transfers: Sit to/from Stand, Bed to chair/wheelchair/BSC Sit to Stand: Max assist, +2 physical assistance   Step pivot transfers: Max assist, +2 physical assistance       General transfer comment: several standing bouts performed. patient does initiate standing. cues for task initiation, foot positioning, anterior weight shifting    Ambulation/Gait Ambulation/Gait assistance: Max assist, +2 physical assistance (periods of less assistance required) Gait Distance (Feet): 10 Feet Assistive device: 2 person hand held assist Gait Pattern/deviations: Step-through pattern, Trunk flexed, Narrow base of support Gait velocity: decreased     General Gait Details: patient walked a short distance with cues for initiation. +2 person assistance required for safety. no significant dyspnea noted. Sp02 appears to remain in the 90's on 2 L02 with activity   Stairs             Wheelchair Mobility     Tilt Bed    Modified Rankin (Stroke Patients Only)       Balance Overall balance assessment: Needs assistance Sitting-balance support: Feet supported Sitting balance-Leahy Scale: Poor Sitting balance - Comments: poor initially with posterior lean with periods of CGA with increased activity Postural control: Posterior lean (with left gaze preference and head turn) Standing balance support: Bilateral upper extremity supported Standing balance-Leahy Scale: Poor Standing balance comment: external support required to maintain standing balance                            Cognition Arousal: Lethargic Behavior During Therapy: WFL for tasks assessed/performed Overall Cognitive Status: History of cognitive impairments - at baseline Area of Impairment: Orientation, Following commands, Safety/judgement, Awareness, Problem solving  Orientation Level:  (responds to her name) Current Attention Level: Focused Memory: Decreased short-term memory Following  Commands: Follows one step commands with increased time (multi modal cues required) Safety/Judgement: Decreased awareness of deficits, Decreased awareness of safety   Problem Solving: Slow processing, Decreased initiation, Difficulty sequencing, Requires verbal cues, Requires tactile cues General Comments: initially lethargic with increased arousal with mobility efforts        Exercises      General Comments General comments (skin integrity, edema, etc.): patient up to the recliner chair for part of the session.      Pertinent Vitals/Pain Pain Assessment Pain Assessment: No/denies pain    Home Living                          Prior Function            PT Goals (current goals can now be found in the care plan section) Acute Rehab PT Goals Patient Stated Goal: to return to memory care (daughter's goal) PT Goal Formulation: With family Time For Goal Achievement: 02/18/24 Potential to Achieve Goals: Fair Progress towards PT goals: Progressing toward goals    Frequency    Min 1X/week      PT Plan      Co-evaluation PT/OT/SLP Co-Evaluation/Treatment: Yes Reason for Co-Treatment: Complexity of the patient's impairments (multi-system involvement);Necessary to address cognition/behavior during functional activity;To address functional/ADL transfers;For patient/therapist safety PT goals addressed during session: Mobility/safety with mobility OT goals addressed during session: ADL's and self-care      AM-PAC PT 6 Clicks Mobility   Outcome Measure  Help needed turning from your back to your side while in a flat bed without using bedrails?: A Lot Help needed moving from lying on your back to sitting on the side of a flat bed without using bedrails?: A Lot Help needed moving to and from a bed to a chair (including a wheelchair)?: A Lot Help needed standing up from a chair using your arms (e.g., wheelchair or bedside chair)?: A Lot Help needed to walk in  hospital room?: Total Help needed climbing 3-5 steps with a railing? : Total 6 Click Score: 10    End of Session Equipment Utilized During Treatment: Oxygen;Gait belt Activity Tolerance: Patient limited by fatigue Patient left: in bed;with call bell/phone within reach;with bed alarm set Nurse Communication: Mobility status PT Visit Diagnosis: Muscle weakness (generalized) (M62.81);Unsteadiness on feet (R26.81)     Time: 8640-8574 PT Time Calculation (min) (ACUTE ONLY): 26 min  Charges:    $Therapeutic Activity: 8-22 mins PT General Charges $$ ACUTE PT VISIT: 1 Visit                     Randine Essex, PT, MPT    Randine LULLA Essex 02/06/2024, 2:38 PM

## 2024-02-06 NOTE — TOC Initial Note (Addendum)
 Transition of Care [facility]) - Initial/Assessment Note    Patient Details  Name: Kathryn Morales MRN: [id number] Date of Birth: 12-28-1946  Transition of Care [facility]) CM/SW Contact:    Lauraine JAYSON Carpen, LCSW Phone Number: 02/06/2024, 4:28 PM  Clinical Narrative:  Patient only oriented to self. No family at bedside. CSW called [facility] and spoke to [place] [phone]). She confirmed patient on their memory care side. She stated patient was not receiving home health prior to admission and was able to ambulate without DME. No oxygen use prior to admission. They do not need to assess patient prior to return as long as patient can at least help with standing and pivoting. CSW sent secure chat to RN and therapy team to see if they can work on this, this weekend. CSW called daughter. She believes patient is getting home health services through [facility]. Liaison is checking. Daughter is agreeable to DME recommendation for wheelchair but hopes she improve enough to not need it. Patient is currently on acute oxygen. Will follow for this potential discharge need. Based on ABG, patient does not qualify for home bipap machine. CSW updated team.              4:36 pm: Patient was not active with [facility] prior to admission but they can accept her for PT and OT. They do not have speech. Daughter and/or husband will likely pick up patient at discharge but it will need to be in the later afternoon (around 4:00) because of work.  Expected Discharge Plan: Memory Care (with home health) Barriers to Discharge: Continued Medical Work up   Patient Goals and CMS Choice            Expected Discharge Plan and Services     Post Acute Care Choice: Durable Medical Equipment, Home Health Living arrangements for the past 2 months: Assisted Living Facility                                      Prior Living Arrangements/Services Living arrangements for the past 2 months: Assisted Living  Facility Lives with:: Facility Resident Patient language and need for interpreter reviewed:: Yes Do you feel safe going back to the place where you live?: Yes      Need for Family Participation in Patient Care: Yes (Comment) Care giver support system in place?: Yes (comment)   Criminal Activity/Legal Involvement Pertinent to Current Situation/Hospitalization: No - Comment as needed  Activities of Daily Living   ADL Screening (condition at time of admission) Independently performs ADLs?: No Does the patient have a NEW difficulty with bathing/dressing/toileting/self-feeding that is expected to last >3 days?: No Does the patient have a NEW difficulty with getting in/out of bed, walking, or climbing stairs that is expected to last >3 days?: No Does the patient have a NEW difficulty with communication that is expected to last >3 days?: No Is the patient deaf or have difficulty hearing?: No Does the patient have difficulty seeing, even when wearing glasses/contacts?: No Does the patient have difficulty concentrating, remembering, or making decisions?: Yes  Permission Sought/Granted Permission sought to share information with : Facility Medical Sales Representative, Family Supports    Share Information with NAME: [name]  Permission granted to share info w AGENCY: [facility], [phone]  Permission granted to share info w Relationship: Daughter  Permission granted to share info w Contact Information: [phone]  Emotional Assessment  Appearance:: Appears stated age Attitude/Demeanor/Rapport: Unable to Assess Affect (typically observed): Unable to Assess Orientation: : Oriented to Self Alcohol / Substance Use: Not Applicable Psych Involvement: No (comment)  Admission diagnosis:  Acute respiratory failure with hypoxia (HCC) [J96.01] Sepsis (HCC) [A41.9] Fever, unspecified fever cause [R50.9] Altered mental status, unspecified altered mental status type [R41.82] Sepsis, due to unspecified  organism, unspecified whether acute organ dysfunction present [employer]) [A41.9] Patient Active Problem List   Diagnosis Date Noted   Fever 02/03/2024   Altered mental status 02/02/2024   Myocardial injury 02/01/2024   Rhabdomyolysis 02/01/2024   Acute metabolic encephalopathy 01/30/2024   Atelectasis 01/30/2024   Hypotension 01/30/2024   Dementia without behavioral disturbance (HCC) 01/30/2024   Septic shock (HCC) 01/29/2024   Acute respiratory failure with hypoxia (HCC) 01/29/2024   Arthritis of right hip 06/26/2018   Degenerative disc disease, lumbar 06/26/2018   Impaired memory 11/26/2016   Anxiety and depression 05/07/2016   Carotid artery narrowing 05/07/2016   Hypercholesteremia 02/14/2016   History of colon polyps 06/09/2008   BP (high blood pressure) 02/23/2008   ADD (attention deficit disorder) 06/16/2007   Clinical depression 03/31/2007   HLD (hyperlipidemia) 03/31/2007   PCP:  [place] Pharmacy:   [facility] [place] - [address] [place] [address] Phone: [phone] Fax: [phone]     Social Drivers of Health (SDOH) Social History: SDOH Screenings   Food Insecurity: No Food Insecurity (01/30/2024)  Housing: Low Risk  (01/30/2024)  Transportation Needs: Patient Unable To Answer (01/30/2024)  Utilities: Patient Unable To Answer (01/30/2024)  Financial Resource Strain: Low Risk  (08/07/2018)  Social Connections: Patient Unable To Answer (01/30/2024)  Stress: No Stress Concern Present (08/07/2018)  Tobacco Use: Low Risk  (02/03/2024)   SDOH Interventions:     Readmission Risk Interventions     No data to display

## 2024-02-06 NOTE — Progress Notes (Signed)
   02/06/24 1045  Spiritual Encounters  Type of Visit Follow up  Care provided to: Patient  Referral source Chaplain assessment  Reason for visit Routine spiritual support  OnCall Visit No  Spiritual Framework  Presenting Themes Other (comment) (Pt is unable to respond coherently)  Interventions  Spiritual Care Interventions Made Compassionate presence;Prayer  Intervention Outcomes  Outcomes Other (comment) (Pt unable to respond)

## 2024-02-06 NOTE — Progress Notes (Signed)
 Updated pt's son Jeanette Milks at bedside on plan of care.  All questions answered.    Cherylann Corpus, AGACNP-BC [facility] Prefer epic messenger for cross cover needs If after hours, please call E-link

## 2024-02-07 DIAGNOSIS — R6521 Severe sepsis with septic shock: Secondary | ICD-10-CM | POA: Diagnosis not present

## 2024-02-07 DIAGNOSIS — A419 Sepsis, unspecified organism: Secondary | ICD-10-CM | POA: Diagnosis not present

## 2024-02-07 LAB — GLUCOSE, CAPILLARY
Glucose-Capillary: 105 mg/dL — ABNORMAL HIGH (ref 70–99)
Glucose-Capillary: 105 mg/dL — ABNORMAL HIGH (ref 70–99)
Glucose-Capillary: 120 mg/dL — ABNORMAL HIGH (ref 70–99)
Glucose-Capillary: 142 mg/dL — ABNORMAL HIGH (ref 70–99)
Glucose-Capillary: 155 mg/dL — ABNORMAL HIGH (ref 70–99)

## 2024-02-07 LAB — RENAL FUNCTION PANEL
Albumin: 2.7 g/dL — ABNORMAL LOW (ref 3.5–5.0)
Anion gap: 10 (ref 5–15)
BUN: 28 mg/dL — ABNORMAL HIGH (ref 8–23)
CO2: 30 mmol/L (ref 22–32)
Calcium: 8.5 mg/dL — ABNORMAL LOW (ref 8.9–10.3)
Chloride: 100 mmol/L (ref 98–111)
Creatinine, Ser: 0.83 mg/dL (ref 0.44–1.00)
GFR, Estimated: >60 mL/min (ref >60)
Glucose, Bld: 106 mg/dL — ABNORMAL HIGH (ref 70–99)
Phosphorus: 4.2 mg/dL (ref 2.5–4.6)
Potassium: 3.7 mmol/L (ref 3.5–5.1)
Sodium: 140 mmol/L (ref 135–145)

## 2024-02-07 LAB — CBC
HCT: 32.3 % — ABNORMAL LOW (ref 36.0–46.0)
Hemoglobin: 10.8 g/dL — ABNORMAL LOW (ref 12.0–15.0)
MCH: 32 pg (ref 26.0–34.0)
MCHC: 33.4 g/dL (ref 30.0–36.0)
MCV: 95.8 fL (ref 80.0–100.0)
Platelets: 249 10*3/uL (ref 150–400)
RBC: 3.37 MIL/uL — ABNORMAL LOW (ref 3.87–5.11)
RDW: 13.3 % (ref 11.5–15.5)
WBC: 9.4 10*3/uL (ref 4.0–10.5)
nRBC: 0 % (ref 0.0–0.2)

## 2024-02-07 MED ORDER — ZINC OXIDE 11.3 % EX CREA: TOPICAL_CREAM | Freq: Two times a day (BID) | CUTANEOUS | Status: DC | PRN

## 2024-02-07 MED ORDER — BARRIER CREAM NON-SPECIFIED: 1.0000 | TOPICAL_CREAM | Freq: Two times a day (BID) | TOPICAL | Status: DC | PRN

## 2024-02-07 NOTE — Plan of Care (Signed)
   Problem: Fluid Volume: Goal: Hemodynamic stability will improve Outcome: Progressing   Problem: Clinical Measurements: Goal: Diagnostic test results will improve Outcome: Progressing Goal: Signs and symptoms of infection will decrease Outcome: Progressing   Problem: Respiratory: Goal: Ability to maintain adequate ventilation will improve Outcome: Progressing

## 2024-02-07 NOTE — Progress Notes (Signed)
       CROSS COVER NOTE  NAME: Kathryn Morales MRN: [id number] DOB : 10/16/1946 ATTENDING PHYSICIAN: Jhonny Calvin NOVAK, MD    Date of Service   02/07/2024   HPI/Events of Note   Message received from nurse via secure chat  Pt is admitted for septic shock and metabolic encephalopathy on 1/30. Currently, pt is q 4 VS. Pt has no sliding scale and BG is stable. Can pt's BG accuchecks be changed to ACHS?   Interventions   Assessment/Plan: No cbg above 180  Stress dose steroids stopped 2/6 D/c glucose monitoring        Erminio LITTIE Cone NP [facility] Cross Cover 7pm-7am - check amion for availability Pager [phone]

## 2024-02-07 NOTE — Progress Notes (Signed)
 PROGRESS NOTE    Kathryn Morales  FMW:[id number] DOB: 07-25-1946 DOA: 01/29/2024 PCP: [place]    Brief Narrative:   78 year old female with history of dementia presenting with respiratory failure and encephalopathy secondary to aspiration pneumonia. She developed complete left lung white out requiring intubation and bronchoscopy for therapeutic aspiration of secretions last weekend. She's since improved but continues with acute hypoxic respiratory failure. Her oxygen requirements have improved and she is weaned to nasal cannula. Will discontinue BiPAP as she would not qualify for this without sleep study. Plan for discharge back to nursing facility early next week.    Assessment & Plan:   Principal Problem:   Septic shock (HCC) Active Problems:   Hypotension   Acute respiratory failure with hypoxia (HCC)   Acute metabolic encephalopathy   Atelectasis   Dementia without behavioral disturbance (HCC)   Myocardial injury   Rhabdomyolysis   Altered mental status   Fever  Acute hypoxic respiratory failure in the setting of pneumonia and mucus plugging with atelectasis  Required bronchoscopy and extubation.  This occurred 2/2.  Patient was successfully extubated post procedurally.  Remains on BiPAP nightly.  Oxygen weaned down to 2 L.  Steroids initially required, now weaned off.  Completed course of antibiotic. Plan: Continue to wean oxygen as tolerated currently on 2 L Plan for discharge back to skilled nursing facility early  Septic shock, resolved Severe sepsis, resolved Pneumonia, treated Patient completed a course of Zosyn  for presumed aspiration. Stress dose steroids discontinued 2/6.  Lactic acid normalized. Continue with oxygen, wean as tolerated, bronchodilators as needed  Elevated troponin suspect demand ischemia At bedtime troponin peaked at 89.  No chest pain.  Low suspicion for ACS  Hypokalemia ~ RESOLVED Replace electrolytes as needed   #Mild  thrombocytopenia~resolved  #Anemia without signs of obvious bleeding  No indication of acute blood loss.  No indication for transfusion at this time   #Acute metabolic encephalopathy #Dementia Mental status waxing and waning. Avoid nonessential sedatives Precautions  DVT prophylaxis: SQ Lovenox  Code Status: DNR Family Communication: Daughter at bedside 2/8 Disposition Plan: Status is: Inpatient Remains inpatient appropriate because: Resolving respiratory failure   Level of care: Med-Surg  Consultants:  None  Procedures:  Bronchoscopy  Antimicrobials: None   Subjective: Seen and examined.  Patient lethargic.  Unable to provide history.  Objective: Vitals:   02/06/24 2156 02/06/24 2329 02/07/24 0300 02/07/24 0736  BP: 135/71   125/74  Pulse: 91 86  89  Resp: 18   16  Temp: 99.3 F (37.4 C)   98.4 F (36.9 C)  TempSrc: Oral     SpO2: 92% 96%  97%  Weight:   53.2 kg   Height:        Intake/Output Summary (Last 24 hours) at 02/07/2024 1238 Last data filed at 02/06/2024 1300 Gross per 24 hour  Intake 240 ml  Output --  Net 240 ml   Filed Weights   02/05/24 0500 02/06/24 0500 02/07/24 0300  Weight: 54.5 kg 54.7 kg 53.2 kg    Examination:  General exam: Appears frail Respiratory system: Bibasilar crackles.  Normal work of breathing.  2 L Cardiovascular system: S1-S2, RRR, no murmurs, no pedal edema Gastrointestinal system: Thin, soft, NT/ND, normal bowel sounds Central nervous system: Alert but lethargic.  Aphasic.  Unable to assess orientation Extremities: Symmetrically decreased power.  Gait not assessed Skin: No rashes, lesions or ulcers Psychiatry: Unable to assess    Data Reviewed: I have personally  reviewed following labs and imaging studies  CBC: Recent Labs  Lab 02/01/24 0459 02/02/24 0334 02/03/24 0421 02/04/24 0416 02/05/24 0408 02/06/24 0520 02/07/24 0449  WBC 5.9   < > 9.2 8.7 8.4 8.2 9.4  NEUTROABS 4.6  --   --   --   --   --   --    HGB 11.8*   < > 10.2* 9.8* 11.4* 11.3* 10.8*  HCT 35.9*   < > 30.5* 28.6* 33.6* 33.0* 32.3*  MCV 98.4   < > 95.6 94.4 94.4 94.8 95.8  PLT 135*   < > 149* 161 204 227 249   < > = values in this interval not displayed.   Basic Metabolic Panel: Recent Labs  Lab 02/02/24 0334 02/02/24 1129 02/03/24 0421 02/04/24 0416 02/04/24 1955 02/05/24 0408 02/06/24 0520 02/07/24 0449  NA 140  --  140 139  --  137 138 140  K 3.4*  --  3.4* 2.8* 3.0* 3.8 3.7 3.7  CL 99  --  99 97*  --  100 96* 100  CO2 32  --  30 29  --  30 30 30   GLUCOSE 146*  --  146* 120*  --  90 109* 106*  BUN 24*  --  35* 28*  --  16 18 28*  CREATININE 0.83  --  0.96 0.82  --  0.72 0.86 0.83  CALCIUM 8.0*  --  8.3* 8.1*  --  8.2* 8.6* 8.5*  MG 2.1  --  2.3 2.2  --  2.0 2.1  --   PHOS 3.1   < > 2.8 3.2  --  2.8 4.3 4.2   < > = values in this interval not displayed.   GFR: Estimated Creatinine Clearance: 44.9 mL/min (by C-G formula based on SCr of 0.83 mg/dL). Liver Function Tests: Recent Labs  Lab 02/01/24 0459 02/07/24 0449  AST 70*  --   ALT 28  --   ALKPHOS 39  --   BILITOT 0.6  --   PROT 5.9*  --   ALBUMIN 2.8* 2.7*   No results for input(s): LIPASE, AMYLASE in the last 168 hours. No results for input(s): AMMONIA in the last 168 hours. Coagulation Profile: No results for input(s): INR, PROTIME in the last 168 hours. Cardiac Enzymes: No results for input(s): CKTOTAL, CKMB, CKMBINDEX, TROPONINI in the last 168 hours. BNP (last 3 results) No results for input(s): PROBNP in the last 8760 hours. HbA1C: No results for input(s): HGBA1C in the last 72 hours. CBG: Recent Labs  Lab 02/06/24 1947 02/07/24 0000 02/07/24 0354 02/07/24 0900 02/07/24 1214  GLUCAP 117* 105* 120* 105* 155*   Lipid Profile: No results for input(s): CHOL, HDL, LDLCALC, TRIG, CHOLHDL, LDLDIRECT in the last 72 hours. Thyroid Function Tests: No results for input(s): TSH, T4TOTAL, FREET4,  T3FREE, THYROIDAB in the last 72 hours. Anemia Panel: No results for input(s): VITAMINB12, FOLATE, FERRITIN, TIBC, IRON, RETICCTPCT in the last 72 hours. Sepsis Labs: Recent Labs  Lab 02/01/24 0459  PROCALCITON 1.59    Recent Results (from the past 240 hours)  Resp panel by RT-PCR (RSV, Flu A&B, Covid) Anterior Nasal Swab     Status: None   Collection Time: 01/29/24  9:59 AM   Specimen: Anterior Nasal Swab  Result Value Ref Range Status   SARS Coronavirus 2 by RT PCR NEGATIVE NEGATIVE Final    Comment: (NOTE) SARS-CoV-2 target nucleic acids are NOT DETECTED.  The SARS-CoV-2 RNA is generally detectable in  upper respiratory specimens during the acute phase of infection. The lowest concentration of SARS-CoV-2 viral copies this assay can detect is 138 copies/mL. A negative result does not preclude SARS-Cov-2 infection and should not be used as the sole basis for treatment or other patient management decisions. A negative result may occur with  improper specimen collection/handling, submission of specimen other than nasopharyngeal swab, presence of viral mutation(s) within the areas targeted by this assay, and inadequate number of viral copies(<138 copies/mL). A negative result must be combined with clinical observations, patient history, and epidemiological information. The expected result is Negative.  Fact Sheet for Patients:  bloggercourse.com  Fact Sheet for Healthcare Providers:  seriousbroker.it  This test is no t yet approved or cleared by the [place]  FDA and  has been authorized for detection and/or diagnosis of SARS-CoV-2 by FDA under an Emergency Use Authorization (EUA). This EUA will remain  in effect (meaning this test can be used) for the duration of the COVID-19 declaration under Section 564(b)(1) of the Act, 21 U.S.C.section 360bbb-3(b)(1), unless the authorization is terminated  or revoked  sooner.       Influenza A by PCR NEGATIVE NEGATIVE Final   Influenza B by PCR NEGATIVE NEGATIVE Final    Comment: (NOTE) The Xpert Xpress SARS-CoV-2/FLU/RSV plus assay is intended as an aid in the diagnosis of influenza from Nasopharyngeal swab specimens and should not be used as a sole basis for treatment. Nasal washings and aspirates are unacceptable for Xpert Xpress SARS-CoV-2/FLU/RSV testing.  Fact Sheet for Patients: bloggercourse.com  Fact Sheet for Healthcare Providers: seriousbroker.it  This test is not yet approved or cleared by the [place]  FDA and has been authorized for detection and/or diagnosis of SARS-CoV-2 by FDA under an Emergency Use Authorization (EUA). This EUA will remain in effect (meaning this test can be used) for the duration of the COVID-19 declaration under Section 564(b)(1) of the Act, 21 U.S.C. section 360bbb-3(b)(1), unless the authorization is terminated or revoked.     Resp Syncytial Virus by PCR NEGATIVE NEGATIVE Final    Comment: (NOTE) Fact Sheet for Patients: bloggercourse.com  Fact Sheet for Healthcare Providers: seriousbroker.it  This test is not yet approved or cleared by the [place]  FDA and has been authorized for detection and/or diagnosis of SARS-CoV-2 by FDA under an Emergency Use Authorization (EUA). This EUA will remain in effect (meaning this test can be used) for the duration of the COVID-19 declaration under Section 564(b)(1) of the Act, 21 U.S.C. section 360bbb-3(b)(1), unless the authorization is terminated or revoked.  Performed at [facility], [address] [place], [place] [address]   Blood Culture (routine x 2)     Status: None   Collection Time: 01/29/24 10:04 AM   Specimen: BLOOD RIGHT ARM  Result Value Ref Range Status   Specimen Description BLOOD RIGHT ARM  Final   Special Requests    Final    BOTTLES DRAWN AEROBIC AND ANAEROBIC Blood Culture results may not be optimal due to an inadequate volume of blood received in culture bottles   Culture   Final    NO GROWTH 5 DAYS Performed at [facility], [address] [place], [place] [address]    Report Status 02/03/2024 FINAL  Final  Blood Culture (routine x 2)     Status: None   Collection Time: 01/29/24 10:04 AM   Specimen: BLOOD LEFT ARM  Result Value Ref Range Status   Specimen Description BLOOD LEFT ARM  Final  Special Requests   Final    BOTTLES DRAWN AEROBIC AND ANAEROBIC Blood Culture adequate volume   Culture   Final    NO GROWTH 5 DAYS Performed at [facility], [address] [place], [place] [address]    Report Status 02/03/2024 FINAL  Final  CSF culture w Gram Stain     Status: None   Collection Time: 01/29/24  2:57 PM   Specimen: PATH Cytology CSF; Cerebrospinal Fluid  Result Value Ref Range Status   Specimen Description   Final    CSF Performed at [facility], [address], [place], [place] [address]    Special Requests   Final    NONE Performed at [facility], [address] [place], [place] [address]    Gram Stain   Final    NO ORGANISMS SEEN RED BLOOD CELLS PRESENT WBC SEEN    Culture   Final    NO GROWTH 3 DAYS Performed at [facility], [address] [place], [place] [address]    Report Status 02/01/2024 FINAL  Final  MRSA Next Gen by PCR, Nasal     Status: None   Collection Time: 02/01/24  5:03 AM   Specimen: Nasal Mucosa; Nasal Swab  Result Value Ref Range Status   MRSA by PCR Next Gen NOT DETECTED NOT DETECTED Final    Comment: (NOTE) The GeneXpert MRSA Assay (FDA approved for NASAL specimens only), is one component of a comprehensive MRSA colonization surveillance program. It is not intended to diagnose MRSA infection nor to guide or monitor treatment for MRSA infections. Test performance is not FDA approved in patients  less than 72 years old. Performed at [facility], [address] [place], [place] [address]   Culture, BAL-quantitative w Gram Stain     Status: None   Collection Time: 02/01/24  2:49 PM   Specimen: Bronchoalveolar Lavage; Respiratory  Result Value Ref Range Status   Specimen Description   Final    BRONCHIAL ALVEOLAR LAVAGE Performed at [facility], [address] [place], [place] [address]    Special Requests   Final    NONE Performed at [facility], [address] [place], [place] [address]    Gram Stain   Final    MODERATE WBC PRESENT,BOTH PMN AND MONONUCLEAR NO ORGANISMS SEEN    Culture   Final    NO GROWTH 2 DAYS Performed at [facility], [address], [place], [place] [address]    Report Status 02/04/2024 FINAL  Final         Radiology Studies: No results found.      Scheduled Meds:  Chlorhexidine  Gluconate Cloth  6 each Topical Nightly   donepezil   10 mg Oral q AM   enoxaparin  (LOVENOX ) injection  40 mg Subcutaneous QHS   feeding supplement  237 mL Oral BID BM   melatonin  5 mg Oral QHS   multivitamin with minerals  1 tablet Oral Daily   Continuous Infusions:   LOS: 9 days  \    Calvin KATHEE Robson, MD [facility]   If 7PM-7AM, please contact night-coverage  02/07/2024, 12:38 PM

## 2024-02-07 NOTE — Plan of Care (Signed)
   Problem: Education: Goal: Knowledge of General Education information will improve Description: Including pain rating scale, medication(s)/side effects and non-pharmacologic comfort measures Outcome: Progressing   Problem: Nutrition: Goal: Adequate nutrition will be maintained Outcome: Progressing

## 2024-02-07 NOTE — Progress Notes (Signed)
 Speech Language Pathology Treatment: Dysphagia  Patient Details Name: Kathryn Morales MRN: [id number] DOB: June 15, 1946 Today's Date: 02/07/2024 Time: 8899-8874 SLP Time Calculation (min) (ACUTE ONLY): 25 min  Assessment / Plan / Recommendation Clinical Impression  Pt seen for follow up dysphagia intervention, with request from MD for trials of advanced solids. Pt's daughter, Kathryn Morales, present for session. Trials completed of thin liquid via straw and regular solids. No overt or subtle s/sx pharyngeal dysphagia noted. No change to vocal quality across trials. Oral phase was mildly prolonged for mastication of regular solids with eventual independent oral clearance. Education provided to [facility] regarding monitoring alertness and endurance for PO intake and self selection of soft solids for energy conservation. Kathryn Morales reported understanding. Recommend advancement to regular solids and thin liquids. Continue with aspiration precautions (slow rate, small bites, elevated HOB, and alert for PO intake). MD/RN aware of recommendations. No further SLP services indicated at this time.    HPI HPI: Pr MD progress note, 78 year old female past medical history of dementia, hypertension. She presents to the hospital with fever, hypoxia and acute metabolic encephalopathy. She has worsening fatigue and malaise over the past 1 to 2 days found to be leaning in her wheelchair to the left side not responding normally. Patient had a fever of 103 in the emergency room. Lumbar puncture was negative. Patient started empirically on antibiotics. Chest XRay 2/4: Cardiomegaly with vascular congestion and probable edema.  Superimposed pneumonia is not excluded. Head CT 1/30: No acute intracranial abnormality. Chronic cerebral volume loss and white matter changes. Pt currently on 2L nasal canula and on dys 2; thin liquid diet.      SLP Plan  All goals met      Recommendations for follow up therapy are one component of a  multi-disciplinary discharge planning process, led by the attending physician.  Recommendations may be updated based on patient status, additional functional criteria and insurance authorization.    Recommendations  Diet recommendations: Regular;Thin liquid Liquids provided via: Cup;Straw Medication Administration: Crushed with puree (per baseline) Supervision: Full supervision/cueing for compensatory strategies Compensations: Minimize environmental distractions;Slow rate;Small sips/bites Postural Changes and/or Swallow Maneuvers: Seated upright 90 degrees;Upright 30-60 min after meal                 Oral care BID   Frequent or constant Supervision/Assistance Dysphagia, unspecified (R13.10)     All goals met    Kathryn Haughn Clapp, MS, CCC-SLP Speech Language Pathologist Rehab Services; [facility] [phone] (ascom)   Kathryn Morales  02/07/2024, 1:07 PM

## 2024-02-08 DIAGNOSIS — A419 Sepsis, unspecified organism: Secondary | ICD-10-CM | POA: Diagnosis not present

## 2024-02-08 DIAGNOSIS — R6521 Severe sepsis with septic shock: Secondary | ICD-10-CM | POA: Diagnosis not present

## 2024-02-08 LAB — RENAL FUNCTION PANEL
Albumin: 2.9 g/dL — ABNORMAL LOW (ref 3.5–5.0)
Anion gap: 7 (ref 5–15)
BUN: 28 mg/dL — ABNORMAL HIGH (ref 8–23)
CO2: 30 mmol/L (ref 22–32)
Calcium: 8.8 mg/dL — ABNORMAL LOW (ref 8.9–10.3)
Chloride: 103 mmol/L (ref 98–111)
Creatinine, Ser: 0.84 mg/dL (ref 0.44–1.00)
GFR, Estimated: >60 mL/min (ref >60)
Glucose, Bld: 103 mg/dL — ABNORMAL HIGH (ref 70–99)
Phosphorus: 3.5 mg/dL (ref 2.5–4.6)
Potassium: 3.8 mmol/L (ref 3.5–5.1)
Sodium: 140 mmol/L (ref 135–145)

## 2024-02-08 LAB — CBC
HCT: 32.4 % — ABNORMAL LOW (ref 36.0–46.0)
Hemoglobin: 10.9 g/dL — ABNORMAL LOW (ref 12.0–15.0)
MCH: 32.2 pg (ref 26.0–34.0)
MCHC: 33.6 g/dL (ref 30.0–36.0)
MCV: 95.9 fL (ref 80.0–100.0)
Platelets: 271 10*3/uL (ref 150–400)
RBC: 3.38 MIL/uL — ABNORMAL LOW (ref 3.87–5.11)
RDW: 13.2 % (ref 11.5–15.5)
WBC: 8.5 10*3/uL (ref 4.0–10.5)
nRBC: 0 % (ref 0.0–0.2)

## 2024-02-08 NOTE — Progress Notes (Signed)
    Durable Medical Equipment  (From admission, onward)           Start     Ordered   02/08/24 1022  For home use only DME oxygen  Once       Question Answer Comment  Length of Need 6 Months   Mode or (Route) Nasal cannula   Liters per Minute 2   Frequency Continuous (stationary and portable oxygen unit needed)   Oxygen conserving device Yes   Oxygen delivery system Gas      02/08/24 1021   02/08/24 0944  For home use only DME lightweight manual wheelchair with seat cushion  Once       Comments: Patient suffers from weakness, functional decline which impairs their ability to perform daily activities like bathing, dressing, feeding, grooming, and toileting in the home.  A cane, crutch, or walker will not resolve  issue with performing activities of daily living. A wheelchair will allow patient to safely perform daily activities. Patient is not able to propel themselves in the home using a standard weight wheelchair due to arm weakness, endurance, and general weakness. Patient can self propel in the lightweight wheelchair. Length of need Lifetime. Accessories: elevating leg rests (ELRs), wheel locks, extensions and anti-tippers.   02/08/24 9056

## 2024-02-08 NOTE — Plan of Care (Signed)

## 2024-02-08 NOTE — Progress Notes (Signed)
 Oxygen saturations at rest on 2 liters O2 per nasal cannula 93%. Nurse placed patient on room air and oxygen saturations fell within two minutes to 87% on room air. Oxygen placed at 2 liters nasal cannula and patient recovered within 3 minutes to 93% on 2 liters O2.

## 2024-02-08 NOTE — TOC Progression Note (Signed)
 Transition of Care [facility]) - Progression Note    Patient Details  Name: Kathryn Morales MRN: [id number] Date of Birth: June 22, 1946  [facility] CM/SW Contact  Cyle Kenyon E Julious Langlois, LCSW Phone Number: 02/08/2024, 12:17 PM  Clinical Narrative:    Checked with MD - patient expected to DC tomorrow. CSW ordered home o2 and wheelchair through [employer] - she is aware of plan for DC tomorrow.    Expected Discharge Plan: Memory Care (with home health) Barriers to Discharge: Continued Medical Work up  Expected Discharge Plan and Services     Post Acute Care Choice: Durable Medical Equipment, Home Health Living arrangements for the past 2 months: Assisted Living Facility                                       Social Determinants of Health (SDOH) Interventions SDOH Screenings   Food Insecurity: No Food Insecurity (01/30/2024)  Housing: Low Risk  (01/30/2024)  Transportation Needs: Patient Unable To Answer (01/30/2024)  Utilities: Patient Unable To Answer (01/30/2024)  Financial Resource Strain: Low Risk  (08/07/2018)  Social Connections: Patient Unable To Answer (01/30/2024)  Stress: No Stress Concern Present (08/07/2018)  Tobacco Use: Low Risk  (02/03/2024)    Readmission Risk Interventions     No data to display

## 2024-02-08 NOTE — Progress Notes (Signed)
 PROGRESS NOTE    Kathryn Morales  FMW:[id number] DOB: 1946/02/01 DOA: 01/29/2024 PCP: [place]    Brief Narrative:   78 year old female with history of dementia presenting with respiratory failure and encephalopathy secondary to aspiration pneumonia. She developed complete left lung white out requiring intubation and bronchoscopy for therapeutic aspiration of secretions last weekend. She's since improved but continues with acute hypoxic respiratory failure. Her oxygen requirements have improved and she is weaned to nasal cannula. Will discontinue BiPAP as she would not qualify for this without sleep study. Plan for discharge back to nursing facility early next week.    Assessment & Plan:   Principal Problem:   Septic shock (HCC) Active Problems:   Hypotension   Acute respiratory failure with hypoxia (HCC)   Acute metabolic encephalopathy   Atelectasis   Dementia without behavioral disturbance (HCC)   Myocardial injury   Rhabdomyolysis   Altered mental status   Fever  Acute hypoxic respiratory failure in the setting of pneumonia and mucus plugging with atelectasis  Required bronchoscopy and extubation.  This occurred 2/2.  Patient was successfully extubated post procedurally.  Remains on BiPAP nightly.  Oxygen weaned down to 2 L.  Steroids initially required, now weaned off.  Completed course of antibiotic. Plan: Continue to wean oxygen as tolerated currently on 2 L Plan for discharge back to skilled nursing facility early Will qualify for home oxygen DME order placed TOC aware  Septic shock, resolved Severe sepsis, resolved Pneumonia, treated Patient completed a course of Zosyn  for presumed aspiration. Stress dose steroids discontinued 2/6.  Lactic acid normalized. Continue with oxygen, wean as tolerated, bronchodilators as needed  Elevated troponin suspect demand ischemia At bedtime troponin peaked at 89.  No chest pain.  Low suspicion for  ACS  Hypokalemia ~ RESOLVED Replace electrolytes as needed   #Mild thrombocytopenia~resolved  #Anemia without signs of obvious bleeding  No indication of acute blood loss.  No indication for transfusion at this time   #Acute metabolic encephalopathy #Dementia Mental status waxing and waning. Avoid nonessential sedatives Precautions  DVT prophylaxis: SQ Lovenox  Code Status: DNR Family Communication: Daughter at bedside 2/8, son at bedside 2/9 Disposition Plan: Status is: Inpatient Remains inpatient appropriate because: Resolving respiratory failure   Level of care: Med-Surg  Consultants:  None  Procedures:  Bronchoscopy  Antimicrobials: None   Subjective: Seen and examined.  More awake today.  Son at bedside  Objective: Vitals:   02/07/24 1458 02/08/24 0012 02/08/24 0132 02/08/24 0816  BP: 116/68 130/66  (!) 134/91  Pulse: 93 78 74 74  Resp: 16 18 19 18   Temp: 98.4 F (36.9 C) 98.1 F (36.7 C)  97.9 F (36.6 C)  TempSrc:  Oral    SpO2: 93% 96% 95% 98%  Weight:      Height:        Intake/Output Summary (Last 24 hours) at 02/08/2024 1130 Last data filed at 02/08/2024 0450 Gross per 24 hour  Intake 240 ml  Output 350 ml  Net -110 ml   Filed Weights   02/05/24 0500 02/06/24 0500 02/07/24 0300  Weight: 54.5 kg 54.7 kg 53.2 kg    Examination:  General exam: Frail appearing Respiratory system: Bibasilar crackles.  Normal work of breathing.  2 L Cardiovascular system: S1-S2, RRR, no murmurs, no pedal edema Gastrointestinal system: Thin, soft, NT/ND, normal bowel sounds Central nervous system: Alert but lethargic.  Aphasic.  Unable to assess orientation Extremities: Symmetrically decreased power.  Gait not assessed  Skin: No rashes, lesions or ulcers Psychiatry: Unable to assess    Data Reviewed: I have personally reviewed following labs and imaging studies  CBC: Recent Labs  Lab 02/04/24 0416 02/05/24 0408 02/06/24 0520 02/07/24 0449  02/08/24 0441  WBC 8.7 8.4 8.2 9.4 8.5  HGB 9.8* 11.4* 11.3* 10.8* 10.9*  HCT 28.6* 33.6* 33.0* 32.3* 32.4*  MCV 94.4 94.4 94.8 95.8 95.9  PLT 161 204 227 249 271   Basic Metabolic Panel: Recent Labs  Lab 02/02/24 0334 02/02/24 1129 02/03/24 0421 02/04/24 0416 02/04/24 1955 02/05/24 0408 02/06/24 0520 02/07/24 0449 02/08/24 0441  NA 140  --  140 139  --  137 138 140 140  K 3.4*  --  3.4* 2.8* 3.0* 3.8 3.7 3.7 3.8  CL 99  --  99 97*  --  100 96* 100 103  CO2 32  --  30 29  --  30 30 30 30   GLUCOSE 146*  --  146* 120*  --  90 109* 106* 103*  BUN 24*  --  35* 28*  --  16 18 28* 28*  CREATININE 0.83  --  0.96 0.82  --  0.72 0.86 0.83 0.84  CALCIUM 8.0*  --  8.3* 8.1*  --  8.2* 8.6* 8.5* 8.8*  MG 2.1  --  2.3 2.2  --  2.0 2.1  --   --   PHOS 3.1   < > 2.8 3.2  --  2.8 4.3 4.2 3.5   < > = values in this interval not displayed.   GFR: Estimated Creatinine Clearance: 44.4 mL/min (by C-G formula based on SCr of 0.84 mg/dL). Liver Function Tests: Recent Labs  Lab 02/07/24 0449 02/08/24 0441  ALBUMIN 2.7* 2.9*   No results for input(s): LIPASE, AMYLASE in the last 168 hours. No results for input(s): AMMONIA in the last 168 hours. Coagulation Profile: No results for input(s): INR, PROTIME in the last 168 hours. Cardiac Enzymes: No results for input(s): CKTOTAL, CKMB, CKMBINDEX, TROPONINI in the last 168 hours. BNP (last 3 results) No results for input(s): PROBNP in the last 8760 hours. HbA1C: No results for input(s): HGBA1C in the last 72 hours. CBG: Recent Labs  Lab 02/07/24 0000 02/07/24 0354 02/07/24 0900 02/07/24 1214 02/07/24 1952  GLUCAP 105* 120* 105* 155* 142*   Lipid Profile: No results for input(s): CHOL, HDL, LDLCALC, TRIG, CHOLHDL, LDLDIRECT in the last 72 hours. Thyroid Function Tests: No results for input(s): TSH, T4TOTAL, FREET4, T3FREE, THYROIDAB in the last 72 hours. Anemia Panel: No results for  input(s): VITAMINB12, FOLATE, FERRITIN, TIBC, IRON, RETICCTPCT in the last 72 hours. Sepsis Labs: No results for input(s): PROCALCITON, LATICACIDVEN in the last 168 hours.   Recent Results (from the past 240 hours)  CSF culture w Gram Stain     Status: None   Collection Time: 01/29/24  2:57 PM   Specimen: PATH Cytology CSF; Cerebrospinal Fluid  Result Value Ref Range Status   Specimen Description   Final    CSF Performed at [facility], [address], [place], [place] [address]    Special Requests   Final    NONE Performed at [facility], [address] [place], [place] [address]    Gram Stain   Final    NO ORGANISMS SEEN RED BLOOD CELLS PRESENT WBC SEEN    Culture   Final    NO GROWTH 3 DAYS Performed at [facility], [address] [place], [place]  [address]    Report Status 02/01/2024 FINAL  Final  MRSA Next Gen by PCR, Nasal     Status: None   Collection Time: 02/01/24  5:03 AM   Specimen: Nasal Mucosa; Nasal Swab  Result Value Ref Range Status   MRSA by PCR Next Gen NOT DETECTED NOT DETECTED Final    Comment: (NOTE) The GeneXpert MRSA Assay (FDA approved for NASAL specimens only), is one component of a comprehensive MRSA colonization surveillance program. It is not intended to diagnose MRSA infection nor to guide or monitor treatment for MRSA infections. Test performance is not FDA approved in patients less than 58 years old. Performed at [facility], [address] [place], [place] [address]   Culture, BAL-quantitative w Gram Stain     Status: None   Collection Time: 02/01/24  2:49 PM   Specimen: Bronchoalveolar Lavage; Respiratory  Result Value Ref Range Status   Specimen Description   Final    BRONCHIAL ALVEOLAR LAVAGE Performed at [facility], [address] [place], [place] [address]    Special Requests   Final    NONE Performed at [facility], [address]  [place], [place] [address]    Gram Stain   Final    MODERATE WBC PRESENT,BOTH PMN AND MONONUCLEAR NO ORGANISMS SEEN    Culture   Final    NO GROWTH 2 DAYS Performed at [facility], [address], [place], [place] [address]    Report Status 02/04/2024 FINAL  Final         Radiology Studies: No results found.      Scheduled Meds:  Chlorhexidine  Gluconate Cloth  6 each Topical Nightly   donepezil   10 mg Oral q AM   enoxaparin  (LOVENOX ) injection  40 mg Subcutaneous QHS   feeding supplement  237 mL Oral BID BM   melatonin  5 mg Oral QHS   multivitamin with minerals  1 tablet Oral Daily   Continuous Infusions:   LOS: 10 days  \    Calvin KATHEE Robson, MD [facility]   If 7PM-7AM, please contact night-coverage  02/08/2024, 11:30 AM

## 2024-02-09 DIAGNOSIS — R6521 Severe sepsis with septic shock: Secondary | ICD-10-CM | POA: Diagnosis not present

## 2024-02-09 DIAGNOSIS — A419 Sepsis, unspecified organism: Secondary | ICD-10-CM | POA: Diagnosis not present

## 2024-02-09 MED ORDER — ENSURE ENLIVE PO LIQD: 237.0000 mL | Freq: Two times a day (BID) | ORAL | Status: AC

## 2024-02-09 NOTE — Progress Notes (Signed)
 Report given to Melissa, LPN at [facility]. EMS here to transport pt to facility. Daughter notified.

## 2024-02-09 NOTE — Plan of Care (Signed)

## 2024-02-09 NOTE — NC FL2 (Signed)
 [place]  MEDICAID FL2 LEVEL OF CARE FORM     IDENTIFICATION  Patient Name: Kathryn Morales Birthdate: 1946/04/20 Sex: female Admission Date (Current Location): 01/29/2024  [facility] and [place] Number:  Chiropodist and Address:  [facility], [address] [place], [place] [address]      Provider Number: [id number]  Attending Physician Name and Address:  Tiajuana Fluke, MD  Relative Name and Phone Number:  [name] [phone]    Current Level of Care: Hospital Recommended Level of Care: Assisted Living Facility Prior Approval Number:    Date Approved/Denied:   PASRR Number:    Discharge Plan:  (Assisted Living facility)    Current Diagnoses: Patient Active Problem List   Diagnosis Date Noted   Fever 02/03/2024   Altered mental status 02/02/2024   Myocardial injury 02/01/2024   Rhabdomyolysis 02/01/2024   Acute metabolic encephalopathy 01/30/2024   Atelectasis 01/30/2024   Hypotension 01/30/2024   Dementia without behavioral disturbance (HCC) 01/30/2024   Septic shock (HCC) 01/29/2024   Acute respiratory failure with hypoxia (HCC) 01/29/2024   Arthritis of right hip 06/26/2018   Degenerative disc disease, lumbar 06/26/2018   Impaired memory 11/26/2016   Anxiety and depression 05/07/2016   Carotid artery narrowing 05/07/2016   Hypercholesteremia 02/14/2016   History of colon polyps 06/09/2008   BP (high blood pressure) 02/23/2008   ADD (attention deficit disorder) 06/16/2007   Clinical depression 03/31/2007   HLD (hyperlipidemia) 03/31/2007    Orientation RESPIRATION BLADDER Height & Weight     Self  O2 (02 2-3L per [place]) Incontinent Weight: 52.9 kg Height:  5\' 2"  (157.5 cm)  BEHAVIORAL SYMPTOMS/MOOD NEUROLOGICAL BOWEL NUTRITION STATUS      Incontinent  (See  Discharge Summary)  AMBULATORY STATUS COMMUNICATION OF NEEDS Skin   Extensive Assist Verbally Normal                       Personal Care Assistance  Level of Assistance  Bathing, Feeding, Dressing Bathing Assistance: Maximum assistance Feeding assistance: Limited assistance Dressing Assistance: Maximum assistance     Functional Limitations Info  Sight, Hearing, Speech Sight Info: Impaired (Wears glasses) Hearing Info: Adequate Speech Info: Adequate    SPECIAL CARE FACTORS FREQUENCY                       Contractures Contractures Info: Not present    Additional Factors Info  Code Status, Allergies Code Status Info: DNR-Limited Allergies Info: No Known Allergies           Current Medications (02/09/2024):  This is the current hospital active medication list Current Facility-Administered Medications  Medication Dose Route Frequency Provider Last Rate Last Admin   acetaminophen  (TYLENOL ) tablet 650 mg  650 mg Oral Q6H PRN Verla Glaze, MD   650 mg at 02/07/24 2101   Chlorhexidine  Gluconate Cloth 2 % PADS 6 each  6 each Topical Nightly Verla Glaze, MD   6 each at 02/08/24 2158   donepezil  (ARICEPT ) tablet 10 mg  10 mg Oral q AM Verla Glaze, MD   10 mg at 02/09/24 0652   enoxaparin  (LOVENOX ) injection 40 mg  40 mg Subcutaneous QHS Dgayli, Berneta Brightly, MD   40 mg at 02/08/24 2158   feeding supplement (ENSURE ENLIVE / ENSURE PLUS) liquid 237 mL  237 mL Oral BID BM Dgayli, Khabib, MD   237 mL at 02/09/24 1028   ipratropium-albuterol  (DUONEB) 0.5-2.5 (3) MG/3ML nebulizer solution 3  mL  3 mL Nebulization Q4H PRN Elisabeth Guild, NP   3 mL at 02/01/24 0351   melatonin tablet 5 mg  5 mg Oral QHS Verla Glaze, MD   5 mg at 02/08/24 2158   multivitamin with minerals tablet 1 tablet  1 tablet Oral Daily Vergia Glasgow, MD   1 tablet at 02/09/24 1030   ondansetron  (ZOFRAN ) tablet 4 mg  4 mg Oral Q6H PRN Corrinne Din, MD       Or   ondansetron  (ZOFRAN ) injection 4 mg  4 mg Intravenous Q6H PRN Newton, Steven J, MD       Oral care mouth rinse  15 mL Mouth Rinse PRN Gideon Kussmaul, NP       zinc  oxide  (BALMEX) 11.3 % cream   Topical BID PRN Sreenath, Sudheer B, MD         Discharge Medications: Please see discharge summary for a list of discharge medications.  TAKE these medications     ARIPiprazole  2 MG tablet Commonly known as: Abilify  Take 1 tablet (2 mg total) by mouth at bedtime. What changed: when to take this    cholecalciferol 25 MCG (1000 UNIT) tablet Commonly known as: VITAMIN D3 Take 2,000 Units by mouth daily.    divalproex  125 MG capsule Commonly known as: DEPAKOTE  SPRINKLE Take 125 mg by mouth 2 (two) times daily.    donepezil  10 MG tablet Commonly known as: ARICEPT  Take 1 tablet (10 mg total) by mouth at bedtime. What changed: when to take this    feeding supplement Liqd Take 237 mLs by mouth 2 (two) times daily between meals.    hydrochlorothiazide 12.5 MG capsule Commonly known as: MICROZIDE Take 12.5 mg by mouth daily.    melatonin 5 MG Tabs Take 5 mg by mouth at bedtime.    sertraline  50 MG tablet Commonly known as: ZOLOFT  Take 75 mg by mouth daily.    simvastatin  40 MG tablet Commonly known as: ZOCOR  Take 1 tablet (40 mg total) by mouth at bedtime.      Relevant Imaging Results:  Relevant Lab Results:   Additional Information [id number]  Beila Purdie A Amoni Scallan, RN

## 2024-02-09 NOTE — TOC Transition Note (Signed)
 Transition of Care [facility]) - Discharge Note   Patient Details  Name: Kathryn Morales MRN: [id number] Date of Birth: [birth year]  Transition of Care [facility]) CM/SW Contact:  Dean Goldner A Cari Burgo, RN Phone Number: 02/09/2024, 4:15 PM   Clinical Narrative:    Chart reviewed.  Noted that patient has orders for discharge today.    I have spoken with Melissa at [facility].  I have informed Melissa that patient will be a discharge back to the facility today.  I have informed Melissa that patient will be returning to today.  I have made Melissa aware that patient will need 02 on discharge an a wheelchair.  I have made Melissa aware that 02 and a wheelchair has been ordered via [employer].  I have faxed Melissa patient's Discharge Summary and Fl2.  I have informed Melissa that I will sent [name] once I confirm that 02 concentrator has been delivered to the facility.    I have made Acie Holiday patient's daughter aware of the above information. I have informed Acie Holiday that [name]'s wheelchair is at the nursing station and that her family would need to pick up the wheelchair from the nursing station for patient to use at the facility.    I have asked Mitch with [employer] to also make sure that patient has portable tanks that she will be able to use at the facility.    I will arranged EMS once portable concentrator has arrive at the facility.    I have informed staff nurse to call report to [phone].  I have also made staff nurse aware of the above information.    Final next level of care: Assisted Living Barriers to Discharge: No Barriers Identified   Patient Goals and CMS Choice   CMS Medicare.gov Compare Post Acute Care list provided to:: Patient Choice offered to / list presented to : Patient      Discharge Placement              Patient chooses bed at:  (Patient is a resident of [facility]) Patient to be transferred to facility by: [facility] Name of family member notified: Acie Holiday Patient  and family notified of of transfer: 02/09/24  Discharge Plan and Services Additional resources added to the After Visit Summary for       Post Acute Care Choice: Durable Medical Equipment, Home Health          DME Arranged: Oxygen, High strength lightweight manual wheelchair with seat cushion DME Agency: [employer] Date DME Agency Contacted: 02/08/24   Representative spoke with at DME Agency: Jon/Mitch            Social Drivers of Health (SDOH) Interventions SDOH Screenings   Food Insecurity: No Food Insecurity (01/30/2024)  Housing: Low Risk  (01/30/2024)  Transportation Needs: Patient Unable To Answer (01/30/2024)  Utilities: Patient Unable To Answer (01/30/2024)  Financial Resource Strain: Low Risk  (08/07/2018)  Social Connections: Patient Unable To Answer (01/30/2024)  Stress: No Stress Concern Present (08/07/2018)  Tobacco Use: Low Risk  (02/03/2024)     Readmission Risk Interventions     No data to display

## 2024-02-09 NOTE — Discharge Summary (Signed)
 Physician Discharge Summary  Kathryn Morales ZOX:[id number] DOB: 1946-03-13 DOA: 01/29/2024  PCP: Cole Daubs, [facility]  Admit date: 01/29/2024 Discharge date: 02/09/2024  Admitted From: Memory care Disposition:  Memory care [facility] hall)  Recommendations for Outpatient Follow-up:  Follow up with PCP in 1-2 weeks   Home Health:NA Equipment/Devices:Oxygen 2L via [place]   Discharge Condition:Stable  CODE STATUS:DNR  Diet recommendation: Regular  Brief/Interim Summary:  78 year old female with history of dementia presenting with respiratory failure and encephalopathy secondary to aspiration pneumonia. She developed complete left lung white out requiring intubation and bronchoscopy for therapeutic aspiration of secretions last weekend. She's since improved but continues with acute hypoxic respiratory failure. Her oxygen requirements have improved and she is weaned to nasal cannula. Will discontinue BiPAP as she would not qualify for this without sleep study.    Discharge Diagnoses:  Principal Problem:   Septic shock (HCC) Active Problems:   Hypotension   Acute respiratory failure with hypoxia (HCC)   Acute metabolic encephalopathy   Atelectasis   Dementia without behavioral disturbance (HCC)   Myocardial injury   Rhabdomyolysis   Altered mental status   Fever  Acute hypoxic respiratory failure in the setting of pneumonia and mucus plugging with atelectasis  Required bronchoscopy and extubation.  This occurred 2/2.  Patient was successfully extubated post procedurally.  Remains on BiPAP nightly.  Oxygen weaned down to 2 L.  Steroids initially required, now weaned off.  Completed course of antibiotic. Plan: Patient remains on minimal amount of oxygen at time of discharge.  Will plan to return back to likely follow-up memory care facility with oxygen via nasal cannula.  Recommend intermittent monitoring of oxygen saturation and titration of oxygen as tolerated.  Goal saturation  88%.  Septic shock, resolved Severe sepsis, resolved Pneumonia, treated Patient completed a course of Zosyn  for presumed aspiration. Stress dose steroids discontinued 2/6.  Lactic acid normalized. Continue with oxygen, wean as tolerated, bronchodilators as needed No abx on dc    Discharge Instructions  Discharge Instructions     Diet - low sodium heart healthy   Complete by: As directed    Increase activity slowly   Complete by: As directed       Allergies as of 02/09/2024   No Known Allergies      Medication List     TAKE these medications    ARIPiprazole  2 MG tablet Commonly known as: Abilify  Take 1 tablet (2 mg total) by mouth at bedtime. What changed: when to take this   cholecalciferol 25 MCG (1000 UNIT) tablet Commonly known as: VITAMIN D3 Take 2,000 Units by mouth daily.   divalproex  125 MG capsule Commonly known as: DEPAKOTE  SPRINKLE Take 125 mg by mouth 2 (two) times daily.   donepezil  10 MG tablet Commonly known as: ARICEPT  Take 1 tablet (10 mg total) by mouth at bedtime. What changed: when to take this   feeding supplement Liqd Take 237 mLs by mouth 2 (two) times daily between meals.   hydrochlorothiazide 12.5 MG capsule Commonly known as: MICROZIDE Take 12.5 mg by mouth daily.   melatonin 5 MG Tabs Take 5 mg by mouth at bedtime.   sertraline  50 MG tablet Commonly known as: ZOLOFT  Take 75 mg by mouth daily.   simvastatin  40 MG tablet Commonly known as: ZOCOR  Take 1 tablet (40 mg total) by mouth at bedtime.               Durable Medical Equipment  (From admission, onward)  Start     Ordered   02/08/24 1022  For home use only DME oxygen  Once       Question Answer Comment  Length of Need 6 Months   Mode or (Route) Nasal cannula   Liters per Minute 2   Frequency Continuous (stationary and portable oxygen unit needed)   Oxygen conserving device Yes   Oxygen delivery system Gas      02/08/24 1021   02/08/24  0944  For home use only DME lightweight manual wheelchair with seat cushion  Once       Comments: Patient suffers from weakness, functional decline which impairs their ability to perform daily activities like bathing, dressing, feeding, grooming, and toileting in the home.  A cane, crutch, or walker will not resolve  issue with performing activities of daily living. A wheelchair will allow patient to safely perform daily activities. Patient is not able to propel themselves in the home using a standard weight wheelchair due to arm weakness, endurance, and general weakness. Patient can self propel in the lightweight wheelchair. Length of need Lifetime. Accessories: elevating leg rests (ELRs), wheel locks, extensions and anti-tippers.   02/08/24 0943            No Known Allergies  Consultations: PCCM   Procedures/Studies: DG Chest Port 1 View Result Date: 02/03/2024 CLINICAL DATA:  Acute respiratory failure and hypoxia. EXAM: PORTABLE CHEST 1 VIEW COMPARISON:  Chest radiograph dated 02/01/2024. FINDINGS: Interval removal of the endotracheal tube. There is shallow inspiration. Cardiomegaly with vascular congestion and probable edema. Superimposed pneumonia is not excluded. Small left pleural effusion. No pneumothorax. No acute osseous pathology. IMPRESSION: Cardiomegaly with vascular congestion and probable edema. Superimposed pneumonia is not excluded. Electronically Signed   By: Angus Bark M.D.   On: 02/03/2024 09:50   DG Chest 1 View Result Date: 02/01/2024 CLINICAL DATA:  Intubation EXAM: CHEST  1 VIEW COMPARISON:  02/01/2024 FINDINGS: Endotracheal tube is 3.5 cm above the carina. Improved aeration with increasing lung volumes. Bilateral airspace disease. Suspect small layering left effusion. Heart and mediastinal contours within normal limits. Aortic atherosclerosis. IMPRESSION: Interval intubation with improving lung volumes. Bilateral airspace opacities could reflect edema or infection.  Suspect small left effusion. Electronically Signed   By: Janeece Mechanic M.D.   On: 02/01/2024 15:10   DG Chest Port 1 View Result Date: 02/01/2024 CLINICAL DATA:  Acute respiratory failure EXAM: PORTABLE CHEST 1 VIEW COMPARISON:  Chest CT from 01/29/2024 FINDINGS: Opacification of the left more than right chest, densely so on the left where there is volume loss. At visible aerated areas there is interlobular septal thickening. Heart size is largely obscured. No pneumothorax. Pleural fluid is likely on the left. IMPRESSION: Worsening aeration with a mix of airspace disease, volume loss, and pulmonary edema. Electronically Signed   By: Ronnette Coke M.D.   On: 02/01/2024 04:12   CT Chest Wo Contrast Result Date: 01/29/2024 CLINICAL DATA:  Respiratory illness, nondiagnostic xray EXAM: CT CHEST WITHOUT CONTRAST TECHNIQUE: Multidetector CT imaging of the chest was performed following the standard protocol without IV contrast. RADIATION DOSE REDUCTION: This exam was performed according to the departmental dose-optimization program which includes automated exposure control, adjustment of the mA and/or kV according to patient size and/or use of iterative reconstruction technique. COMPARISON:  X-ray 01/29/2024 FINDINGS: Cardiovascular: Heart size is upper limits of normal. No pericardial effusion. Thoracic aorta is nonaneurysmal. Atherosclerotic calcifications of the aorta and coronary arteries. Central pulmonary vasculature is nondilated. Mediastinum/Nodes: No axillary  or mediastinal lymphadenopathy. Evaluation of the hilar structures is limited in the absence of intravenous contrast. Within this limitation, no obvious hilar adenopathy or mass is identified. Thyroid gland, trachea, and esophagus within normal limits. Lungs/Pleura: There is complete atelectasis/collapse of the left lower lobe. Left lower lobe bronchus is completely occluded. Left upper lobe remains aerated. Mild right basilar subsegmental atelectasis.  Right lung is otherwise clear. No pleural effusion or pneumothorax. Upper Abdomen: No acute abnormality. Musculoskeletal: Saturated thoracic kyphosis. No acute osseous abnormality. No focal chest wall abnormality. IMPRESSION: 1. Complete atelectasis/collapse of the left lower lobe. Left lower lobe bronchus is completely occluded. Findings favor mucous plugging and postobstructive atelectasis. Follow-up to resolution is recommended as an obstructing bronchial mass is not entirely excluded. 2. Aortic and coronary artery atherosclerosis (ICD10-I70.0). Electronically Signed   By: Leverne Reading D.O.   On: 01/29/2024 18:09   DG FL GUIDED LUMBAR PUNCTURE Result Date: 01/29/2024 CLINICAL DATA:  78 year old female with history of dementia presents with fever and AMS. Lumbar puncture requested. EXAM: LUMBAR PUNCTURE UNDER FLUOROSCOPY PROCEDURE: An appropriate skin entry site was determined fluoroscopically. Operator donned sterile gloves and mask. Skin site was marked, then prepped with Betadine, draped in usual sterile fashion, and infiltrated locally with 1% lidocaine . A 20 gauge spinal needle advanced into the thecal sac at L3-L4 from a left interlaminar approach. Clear colorless CSF spontaneously returned, with opening pressure of 20 cm water. 13 ml CSF were collected and divided among 4 sterile vials for the requested laboratory studies. The needle was then removed. The patient tolerated the procedure well and there were no complications. FLUOROSCOPY: Radiation Exposure Index (as provided by the fluoroscopic device): 11.6 mGy Kerma IMPRESSION: Technically successful lumbar puncture under fluoroscopy. This exam was performed by Quintin Buckle PA-C, and was supervised and interpreted by Mannie Seek, MD. Electronically Signed   By: Mannie Seek M.D.   On: 01/29/2024 16:07   DG Chest Port 1 View Result Date: 01/29/2024 CLINICAL DATA:  Altered mental status.  Concern for sepsis. EXAM: PORTABLE CHEST 1 VIEW  COMPARISON:  Chest radiograph dated December 21, 2022. FINDINGS: Low lung volumes. Cardiomediastinal silhouette is otherwise unchanged. Aortic atherosclerosis. Streaky bibasilar opacities. No sizable pleural effusion or pneumothorax. Diffuse osseous demineralization. No acute osseous abnormality. IMPRESSION: Low lung volumes. Streaky bibasilar opacities favored to represent atelectasis. Electronically Signed   By: Mannie Seek M.D.   On: 01/29/2024 11:03   CT Head Wo Contrast Result Date: 01/29/2024 CLINICAL DATA:  78 year old female with altered mental status since noon yesterday. EXAM: CT HEAD WITHOUT CONTRAST TECHNIQUE: Contiguous axial images were obtained from the base of the skull through the vertex without intravenous contrast. RADIATION DOSE REDUCTION: This exam was performed according to the departmental dose-optimization program which includes automated exposure control, adjustment of the mA and/or kV according to patient size and/or use of iterative reconstruction technique. COMPARISON:  Head CT 08/11/2023 and earlier. FINDINGS: Brain: Stable cerebral volume. No midline shift, ventriculomegaly, mass effect, evidence of mass lesion, intracranial hemorrhage or evidence of cortically based acute infarction. Chronic generalized appearing cerebral volume loss, moderate patchy bilateral white matter hypodensity. Vascular: No suspicious intracranial vascular hyperdensity. Calcified atherosclerosis at the skull base. Skull: Stable, intact.  No acute osseous abnormality identified. Sinuses/Orbits: Visualized paranasal sinuses and mastoids are stable and well aerated. Other: No acute orbit or scalp soft tissue finding. IMPRESSION: 1. No acute intracranial abnormality. 2. Chronic cerebral volume loss and white matter changes. Electronically Signed   By: Marlise Simpers  M.D.   On: 01/29/2024 10:27      Subjective: Seen and examined on the day of discharge.  Stable no distress  Discharge Exam: Vitals:    02/08/24 2342 02/09/24 0842  BP: (!) 148/97 (!) 142/99  Pulse: 96 63  Resp: 17 16  Temp: 98.2 F (36.8 C) 98.4 F (36.9 C)  SpO2: 96% 95%   Vitals:   02/08/24 1536 02/08/24 2342 02/09/24 0500 02/09/24 0842  BP: 120/80 (!) 148/97  (!) 142/99  Pulse: 80 96  63  Resp: 18 17  16   Temp: 98.4 F (36.9 C) 98.2 F (36.8 C)  98.4 F (36.9 C)  TempSrc:      SpO2: 96% 96%  95%  Weight:   52.9 kg   Height:        General: Pt is alert, awake, not in acute distress Cardiovascular: RRR, S1/S2 +, no rubs, no gallops Respiratory: CTA bilaterally, no wheezing, no rhonchi Abdominal: Soft, NT, ND, bowel sounds + Extremities: no edema, no cyanosis    The results of significant diagnostics from this hospitalization (including imaging, microbiology, ancillary and laboratory) are listed below for reference.     Microbiology: Recent Results (from the past 240 hours)  MRSA Next Gen by PCR, Nasal     Status: None   Collection Time: 02/01/24  5:03 AM   Specimen: Nasal Mucosa; Nasal Swab  Result Value Ref Range Status   MRSA by PCR Next Gen NOT DETECTED NOT DETECTED Final    Comment: (NOTE) The GeneXpert MRSA Assay (FDA approved for NASAL specimens only), is one component of a comprehensive MRSA colonization surveillance program. It is not intended to diagnose MRSA infection nor to guide or monitor treatment for MRSA infections. Test performance is not FDA approved in patients less than 65 years old. Performed at [facility], [address] [place], [place] [address]   Culture, BAL-quantitative w Gram Stain     Status: None   Collection Time: 02/01/24  2:49 PM   Specimen: Bronchoalveolar Lavage; Respiratory  Result Value Ref Range Status   Specimen Description   Final    BRONCHIAL ALVEOLAR LAVAGE Performed at [facility], [address] [place], [place] [address]    Special Requests   Final    NONE Performed at [facility], [address]  [place], [place] [address]    Gram Stain   Final    MODERATE WBC PRESENT,BOTH PMN AND MONONUCLEAR NO ORGANISMS SEEN    Culture   Final    NO GROWTH 2 DAYS Performed at [facility], [address] [place], [place] [address]    Report Status 02/04/2024 FINAL  Final     Labs: BNP (last 3 results) Recent Labs    02/01/24 0459  BNP 366.9*   Basic Metabolic Panel: Recent Labs  Lab 02/03/24 0421 02/04/24 0416 02/04/24 1955 02/05/24 0408 02/06/24 0520 02/07/24 0449 02/08/24 0441  NA 140 139  --  137 138 140 140  K 3.4* 2.8* 3.0* 3.8 3.7 3.7 3.8  CL 99 97*  --  100 96* 100 103  CO2 30 29  --  30 30 30 30   GLUCOSE 146* 120*  --  90 109* 106* 103*  BUN 35* 28*  --  16 18 28* 28*  CREATININE 0.96 0.82  --  0.72 0.86 0.83 0.84  CALCIUM 8.3* 8.1*  --  8.2* 8.6* 8.5* 8.8*  MG 2.3 2.2  --  2.0 2.1  --   --  PHOS 2.8 3.2  --  2.8 4.3 4.2 3.5   Liver Function Tests: Recent Labs  Lab 02/07/24 0449 02/08/24 0441  ALBUMIN 2.7* 2.9*   No results for input(s): "LIPASE", "AMYLASE" in the last 168 hours. No results for input(s): "AMMONIA" in the last 168 hours. CBC: Recent Labs  Lab 02/04/24 0416 02/05/24 0408 02/06/24 0520 02/07/24 0449 02/08/24 0441  WBC 8.7 8.4 8.2 9.4 8.5  HGB 9.8* 11.4* 11.3* 10.8* 10.9*  HCT 28.6* 33.6* 33.0* 32.3* 32.4*  MCV 94.4 94.4 94.8 95.8 95.9  PLT 161 204 227 249 271   Cardiac Enzymes: No results for input(s): "CKTOTAL", "CKMB", "CKMBINDEX", "TROPONINI" in the last 168 hours. BNP: Invalid input(s): "POCBNP" CBG: Recent Labs  Lab 02/07/24 0000 02/07/24 0354 02/07/24 0900 02/07/24 1214 02/07/24 1952  GLUCAP 105* 120* 105* 155* 142*   D-Dimer No results for input(s): "DDIMER" in the last 72 hours. Hgb A1c No results for input(s): "HGBA1C" in the last 72 hours. Lipid Profile No results for input(s): "CHOL", "HDL", "LDLCALC", "TRIG", "CHOLHDL", "LDLDIRECT" in the last 72 hours. Thyroid function studies No results for input(s):  "TSH", "T4TOTAL", "T3FREE", "THYROIDAB" in the last 72 hours.  Invalid input(s): "FREET3" Anemia work up No results for input(s): "VITAMINB12", "FOLATE", "FERRITIN", "TIBC", "IRON", "RETICCTPCT" in the last 72 hours. Urinalysis    Component Value Date/Time   COLORURINE YELLOW (A) 01/29/2024 1734   APPEARANCEUR HAZY (A) 01/29/2024 1734   APPEARANCEUR Clear 07/26/2020 1210   LABSPEC 1.020 01/29/2024 1734   PHURINE 8.0 01/29/2024 1734   GLUCOSEU NEGATIVE 01/29/2024 1734   HGBUR NEGATIVE 01/29/2024 1734   BILIRUBINUR NEGATIVE 01/29/2024 1734   BILIRUBINUR Negative 07/26/2020 1210   KETONESUR NEGATIVE 01/29/2024 1734   PROTEINUR NEGATIVE 01/29/2024 1734   UROBILINOGEN 0.2 01/27/2020 1654   NITRITE NEGATIVE 01/29/2024 1734   LEUKOCYTESUR NEGATIVE 01/29/2024 1734   Sepsis Labs Recent Labs  Lab 02/05/24 0408 02/06/24 0520 02/07/24 0449 02/08/24 0441  WBC 8.4 8.2 9.4 8.5   Microbiology Recent Results (from the past 240 hours)  MRSA Next Gen by PCR, Nasal     Status: None   Collection Time: 02/01/24  5:03 AM   Specimen: Nasal Mucosa; Nasal Swab  Result Value Ref Range Status   MRSA by PCR Next Gen NOT DETECTED NOT DETECTED Final    Comment: (NOTE) The GeneXpert MRSA Assay (FDA approved for NASAL specimens only), is one component of a comprehensive MRSA colonization surveillance program. It is not intended to diagnose MRSA infection nor to guide or monitor treatment for MRSA infections. Test performance is not FDA approved in patients less than 51 years old. Performed at [facility], [address] [place], [place] [address]   Culture, BAL-quantitative w Gram Stain     Status: None   Collection Time: 02/01/24  2:49 PM   Specimen: Bronchoalveolar Lavage; Respiratory  Result Value Ref Range Status   Specimen Description   Final    BRONCHIAL ALVEOLAR LAVAGE Performed at [facility], [address] [place], [place] [address]    Special Requests   Final     NONE Performed at [facility], [address] [place], [place] [address]    Gram Stain   Final    MODERATE WBC PRESENT,BOTH PMN AND MONONUCLEAR NO ORGANISMS SEEN    Culture   Final    NO GROWTH 2 DAYS Performed at [facility], [address] [place], [place] [address]    Report Status 02/04/2024 FINAL  Final     Time coordinating discharge: Over 30 minutes  SIGNED:   Tiajuana Fluke, MD  [facility] 02/09/2024, 12:54 PM Pager   If 7PM-7AM, please contact night-coverage

## 2024-02-09 NOTE — Plan of Care (Signed)
   Problem: Fluid Volume: Goal: Hemodynamic stability will improve Outcome: Progressing   Problem: Clinical Measurements: Goal: Diagnostic test results will improve Outcome: Progressing Goal: Signs and symptoms of infection will decrease Outcome: Progressing   Problem: Respiratory: Goal: Ability to maintain adequate ventilation will improve Outcome: Progressing

## 2024-02-09 NOTE — Care Management Important Message (Signed)
 Important Message  Patient Details  Name: Kathryn Morales MRN: [id number] Date of Birth: May 21, 1946   Important Message Given:  Yes - Medicare IM     Felix Host 02/09/2024, 3:14 PM

## 2024-03-30 ENCOUNTER — Other Ambulatory Visit: Payer: Self-pay

## 2024-03-30 ENCOUNTER — Emergency Department
Admission: EM | Admit: 2024-03-30 | Discharge: 2024-03-30 | Disposition: A | Discharge status: Home / Self Care | Attending: Emergency Medicine | Admitting: Emergency Medicine

## 2024-03-30 ENCOUNTER — Emergency Department

## 2024-03-30 ENCOUNTER — Encounter: Payer: Self-pay | Admitting: Emergency Medicine

## 2024-03-30 DIAGNOSIS — F0390 Unspecified dementia without behavioral disturbance: Secondary | ICD-10-CM | POA: Diagnosis not present

## 2024-03-30 DIAGNOSIS — R569 Unspecified convulsions: Secondary | ICD-10-CM | POA: Diagnosis present

## 2024-03-30 LAB — COMPREHENSIVE METABOLIC PANEL WITH GFR
ALT: 17 U/L (ref 0–44)
AST: 27 U/L (ref 15–41)
Albumin: 3.8 g/dL (ref 3.5–5.0)
Alkaline Phosphatase: 63 U/L (ref 38–126)
Anion gap: 10 (ref 5–15)
BUN: 19 mg/dL (ref 8–23)
CO2: 27 mmol/L (ref 22–32)
Calcium: 9.3 mg/dL (ref 8.9–10.3)
Chloride: 103 mmol/L (ref 98–111)
Creatinine, Ser: 0.83 mg/dL (ref 0.44–1.00)
GFR, Estimated: >60 mL/min (ref >60)
Glucose, Bld: 89 mg/dL (ref 70–99)
Potassium: 4 mmol/L (ref 3.5–5.1)
Sodium: 140 mmol/L (ref 135–145)
Total Bilirubin: 0.5 mg/dL (ref 0.0–1.2)
Total Protein: 6.7 g/dL (ref 6.5–8.1)

## 2024-03-30 LAB — CBC WITH DIFFERENTIAL/PLATELET
Abs Immature Granulocytes: 0.02 10*3/uL (ref 0.00–0.07)
Basophils Absolute: 0 10*3/uL (ref 0.0–0.1)
Basophils Relative: 1 %
Eosinophils Absolute: 0.2 10*3/uL (ref 0.0–0.5)
Eosinophils Relative: 4 %
HCT: 36.6 % (ref 36.0–46.0)
Hemoglobin: 12 g/dL (ref 12.0–15.0)
Immature Granulocytes: 0 %
Lymphocytes Relative: 34 %
Lymphs Abs: 1.7 10*3/uL (ref 0.7–4.0)
MCH: 32.8 pg (ref 26.0–34.0)
MCHC: 32.8 g/dL (ref 30.0–36.0)
MCV: 100 fL (ref 80.0–100.0)
Monocytes Absolute: 0.3 10*3/uL (ref 0.1–1.0)
Monocytes Relative: 5 %
Neutro Abs: 2.9 10*3/uL (ref 1.7–7.7)
Neutrophils Relative %: 56 %
Platelets: 211 10*3/uL (ref 150–400)
RBC: 3.66 MIL/uL — ABNORMAL LOW (ref 3.87–5.11)
RDW: 13.8 % (ref 11.5–15.5)
WBC: 5.1 10*3/uL (ref 4.0–10.5)
nRBC: 0 % (ref 0.0–0.2)

## 2024-03-30 LAB — TROPONIN I (HIGH SENSITIVITY): Troponin I (High Sensitivity): 14 ng/L (ref <18)

## 2024-03-30 LAB — MAGNESIUM: Magnesium: 2.2 mg/dL (ref 1.7–2.4)

## 2024-03-30 LAB — CK: Total CK: 212 U/L (ref 38–234)

## 2024-03-30 NOTE — ED Notes (Signed)
 Patient discharged from ED by provider. Discharge instructions reviewed with daughter and all questions answered. Discharge instructions and DNR form sent with daughter back to facility. Patient wheeled from ED in NAD.

## 2024-03-30 NOTE — ED Triage Notes (Signed)
 Pt presents via [facility] from [facility] memory care for seizure. Per staff, pt had seizure lasting approx 3 minutes. Bite marks noted to pt's tongue.   Staff unable to report baseline mental status, but did state pt just started walking again.   Hx of seizures.

## 2024-03-30 NOTE — ED Provider Notes (Signed)
 [facility] Provider Note    Event Date/Time   First MD Initiated Contact with Patient 03/30/24 [phone]     (approximate)   History   Seizures   HPI  Kathryn Morales is a 78 year old female with history of dementia presenting to the ER for evaluation of seizure-like activity.  Patient resides at a memory care facility.  Per report, patient had seizure-like activity lasting for approximately 3 minutes this morning.  Was noted to bite her tongue.  EMS initially reported history of seizures, but I do not see documentation of this in patient's chart during her prior visit, and I do not see any antiepileptic medications on her med list from her facility and her as needed benzodiazepine is listed as for anxiety.  Patient unable to provide history.  Reviewed discharge summary from 02/09/24.  At that time, patient presented with encephalopathy in the setting of an aspiration pneumonia requiring bronchoscopy.       Physical Exam   Triage Vital Signs: ED Triage Vitals [03/30/24 0730]  Encounter Vitals Group     BP      Systolic BP Percentile      Diastolic BP Percentile      Pulse Rate 82     Resp 13     Temp      Temp Source Oral     SpO2      Weight 116 lb 13.5 oz (53 kg)     Height 5\' 2"  (1.575 m)     Head Circumference      Peak Flow      Pain Score      Pain Loc      Pain Education      Exclude from Growth Chart     Most recent vital signs: Vitals:   03/30/24 0730  BP: 136/75  Pulse: 82  Resp: 13  Temp: (!) 97 F (36.1 C)  SpO2: 98%     General: Awake, moaning, does not clearly interact Mouth:  Bite marks over the right side of the tongue without associated laceration CV:  Regular rate, good peripheral perfusion.  Resp:  Unlabored respirations, lung sounds diminished bilaterally with limited effort Abd:  Nondistended, soft, nontender Neuro:  No gross facial asymmetry, somewhat tremulous though no clear rhythmic movement, moves extremities  spontaneously, localizes to pain, but not clearly following commands, unable to tell me her name, do note that during most recent admission was noted to be aphasic on progress note shortly prior to discharge  ED Results / Procedures / Treatments   Labs (all labs ordered are listed, but only abnormal results are displayed) Labs Reviewed  CBC WITH DIFFERENTIAL/PLATELET - Abnormal; Notable for the following components:      Result Value   RBC 3.66 (*)    All other components within normal limits  COMPREHENSIVE METABOLIC PANEL WITH GFR  MAGNESIUM  CK  TROPONIN I (HIGH SENSITIVITY)     EKG EKG independently reviewed interpreted by myself (ER attending) demonstrates:  EKG demonstrates sinus rhythm rate of 81, PR 136, QRS 113, QTc 445, no acute ST changes  RADIOLOGY Imaging independently reviewed and interpreted by myself demonstrates:  CT head without acute bleed Chest x-Randeep Biondolillo without recurrent pneumonia  PROCEDURES:  Critical Care performed: No  Procedures   MEDICATIONS ORDERED IN ED: Medications - No data to display   IMPRESSION / MDM / ASSESSMENT AND PLAN / ED COURSE  I reviewed the triage vital signs and the nursing notes.  Differential diagnosis includes, but is not limited to, intracranial bleed, other acute intracranial process, convulsive syncope, anemia, electrolyte abnormality  Patient's presentation is most consistent with acute presentation with potential threat to life or bodily function.  78 year old female presenting following an episode of seizure-like activity.  Confused on exam, but this does seem that it may not be significantly different from her baseline.  Will obtain labs, CT head to further evaluate.  Will also obtain x-Shizue Kaseman given recent admission for complicated pneumonia.  10:00 AM Labs overall reassuring including normal white blood cell count and hemoglobin.  Reassuring electrolytes.  Normal CK.  Negative troponin.  X-Walda Hertzog much improved from recent  priors without evidence of recurrent pneumonia.  CT head without acute findings.  On reassessment, daughter is present at bedside.  She reports that she routinely sees the patient and does feel that she is currently at her neurologic baseline.  She was told from the facility that the patient was found to be stiff with bite marks over her tongue and foaming at the mouth so they were concerned that she had a seizure.  Daughter does note that the patient has intermittent tremors at baseline. Patient does seem to be comforted by daughters presence.  She has no appreciable focal deficits suggestive of acute stroke.  With patient being at her neurologic baseline, I did discuss discharge with outpatient follow-up and daughter is comfortable with this plan.  As this is the first seizure-like episode, I did discuss holding off on initiation of antiepileptic treatment and daughter is comfortable with this.  Strict return precautions provided.  Patient discharged in stable condition.     FINAL CLINICAL IMPRESSION(S) / ED DIAGNOSES   Final diagnoses:  Seizure-like activity (HCC)     Rx / DC Orders   ED Discharge Orders     None        Note:  This document was prepared using Dragon voice recognition software and may include unintentional dictation errors.   Trinna Post, MD 03/30/24 1000

## 2024-03-30 NOTE — ED Notes (Deleted)
 [facility] called @ 10:24 for transport to [facility] , spoke with Aurther Loft.

## 2024-03-30 NOTE — Discharge Instructions (Signed)
 Kathryn Morales was seen for her seizure-like episode today.  Her labs and CT scan did not show an emergency cause for this.  Please follow-up with your primary care doctor for further evaluation.  Of also included information for follow-up with neurology.  Return to the ER for new or worsening symptoms including recurrent seizures, new numbness, tingling, focal weakness, or any other new or concerning symptoms.

## 2024-04-11 ENCOUNTER — Other Ambulatory Visit: Payer: Self-pay

## 2024-04-11 ENCOUNTER — Emergency Department

## 2024-04-11 ENCOUNTER — Observation Stay
Admission: EM | Admit: 2024-04-11 | Discharge: 2024-04-11 | Disposition: A | Discharge status: Home / Self Care | Attending: Emergency Medicine | Admitting: Emergency Medicine

## 2024-04-11 DIAGNOSIS — R569 Unspecified convulsions: Secondary | ICD-10-CM | POA: Diagnosis present

## 2024-04-11 DIAGNOSIS — J189 Pneumonia, unspecified organism: Secondary | ICD-10-CM | POA: Diagnosis not present

## 2024-04-11 DIAGNOSIS — F0390 Unspecified dementia without behavioral disturbance: Secondary | ICD-10-CM | POA: Diagnosis present

## 2024-04-11 DIAGNOSIS — J181 Lobar pneumonia, unspecified organism: Secondary | ICD-10-CM | POA: Diagnosis not present

## 2024-04-11 DIAGNOSIS — W19XXXA Unspecified fall, initial encounter: Secondary | ICD-10-CM | POA: Insufficient documentation

## 2024-04-11 DIAGNOSIS — R0902 Hypoxemia: Secondary | ICD-10-CM | POA: Diagnosis not present

## 2024-04-11 DIAGNOSIS — S0181XA Laceration without foreign body of other part of head, initial encounter: Secondary | ICD-10-CM | POA: Diagnosis not present

## 2024-04-11 DIAGNOSIS — S01119A Laceration without foreign body of unspecified eyelid and periocular area, initial encounter: Secondary | ICD-10-CM | POA: Diagnosis not present

## 2024-04-11 DIAGNOSIS — X58XXXA Exposure to other specified factors, initial encounter: Secondary | ICD-10-CM | POA: Diagnosis not present

## 2024-04-11 DIAGNOSIS — Z79899 Other long term (current) drug therapy: Secondary | ICD-10-CM | POA: Diagnosis not present

## 2024-04-11 DIAGNOSIS — J9601 Acute respiratory failure with hypoxia: Secondary | ICD-10-CM | POA: Diagnosis not present

## 2024-04-11 LAB — COMPREHENSIVE METABOLIC PANEL WITH GFR
ALT: 20 U/L (ref 0–44)
AST: 26 U/L (ref 15–41)
Albumin: 4 g/dL (ref 3.5–5.0)
Alkaline Phosphatase: 114 U/L (ref 38–126)
Anion gap: 11 (ref 5–15)
BUN: 25 mg/dL — ABNORMAL HIGH (ref 8–23)
CO2: 26 mmol/L (ref 22–32)
Calcium: 9.6 mg/dL (ref 8.9–10.3)
Chloride: 103 mmol/L (ref 98–111)
Creatinine, Ser: 0.9 mg/dL (ref 0.44–1.00)
GFR, Estimated: >60 mL/min (ref >60)
Glucose, Bld: 136 mg/dL — ABNORMAL HIGH (ref 70–99)
Potassium: 3.7 mmol/L (ref 3.5–5.1)
Sodium: 140 mmol/L (ref 135–145)
Total Bilirubin: 0.6 mg/dL (ref 0.0–1.2)
Total Protein: 7.3 g/dL (ref 6.5–8.1)

## 2024-04-11 LAB — URINALYSIS, ROUTINE W REFLEX MICROSCOPIC
Bacteria, UA: NONE SEEN
Bilirubin Urine: NEGATIVE
Glucose, UA: NEGATIVE mg/dL
Hgb urine dipstick: NEGATIVE
Ketones, ur: NEGATIVE mg/dL
Leukocytes,Ua: NEGATIVE
Nitrite: NEGATIVE
Protein, ur: 30 mg/dL — AB
Specific Gravity, Urine: 1.019 (ref 1.005–1.030)
Squamous Epithelial / HPF: 0 /HPF (ref 0–5)
pH: 5 (ref 5.0–8.0)

## 2024-04-11 LAB — CBC WITH DIFFERENTIAL/PLATELET
Abs Immature Granulocytes: 0.09 10*3/uL — ABNORMAL HIGH (ref 0.00–0.07)
Basophils Absolute: 0 10*3/uL (ref 0.0–0.1)
Basophils Relative: 0 %
Eosinophils Absolute: 0.1 10*3/uL (ref 0.0–0.5)
Eosinophils Relative: 1 %
HCT: 38.9 % (ref 36.0–46.0)
Hemoglobin: 12.7 g/dL (ref 12.0–15.0)
Immature Granulocytes: 2 %
Lymphocytes Relative: 41 %
Lymphs Abs: 2.2 10*3/uL (ref 0.7–4.0)
MCH: 32.9 pg (ref 26.0–34.0)
MCHC: 32.6 g/dL (ref 30.0–36.0)
MCV: 100.8 fL — ABNORMAL HIGH (ref 80.0–100.0)
Monocytes Absolute: 0.2 10*3/uL (ref 0.1–1.0)
Monocytes Relative: 4 %
Neutro Abs: 2.7 10*3/uL (ref 1.7–7.7)
Neutrophils Relative %: 52 %
Platelets: 218 10*3/uL (ref 150–400)
RBC: 3.86 MIL/uL — ABNORMAL LOW (ref 3.87–5.11)
RDW: 13.5 % (ref 11.5–15.5)
WBC: 5.3 10*3/uL (ref 4.0–10.5)
nRBC: 0 % (ref 0.0–0.2)

## 2024-04-11 LAB — LACTIC ACID, PLASMA
Lactic Acid, Venous: 4.7 mmol/L (ref 0.5–1.9)
Lactic Acid, Venous: 1.4 mmol/L (ref 0.5–1.9)

## 2024-04-11 MED ORDER — LIDOCAINE HCL (PF) 1 % IJ SOLN
10.0000 mL | Freq: Once | INTRAMUSCULAR | Status: AC
  Administered 2024-04-11: 10 mL
  Filled 2024-04-11: qty 10

## 2024-04-11 MED ORDER — BACITRACIN ZINC 500 UNIT/GM EX OINT
TOPICAL_OINTMENT | Freq: Once | CUTANEOUS | Status: AC
  Administered 2024-04-11: 1 via TOPICAL
  Filled 2024-04-11: qty 0.9

## 2024-04-11 MED ORDER — AZITHROMYCIN 250 MG PO TABS: ORAL_TABLET | ORAL | 0 refills | Status: AC

## 2024-04-11 MED ORDER — SODIUM CHLORIDE 0.9 % IV SOLN
500.0000 mg | Freq: Once | INTRAVENOUS | Status: AC
  Administered 2024-04-11: 500 mg via INTRAVENOUS
  Filled 2024-04-11: qty 5

## 2024-04-11 MED ORDER — LACTATED RINGERS IV BOLUS
1000.0000 mL | Freq: Once | INTRAVENOUS | Status: AC
  Administered 2024-04-11: 1000 mL via INTRAVENOUS

## 2024-04-11 MED ORDER — SODIUM CHLORIDE 0.9 % IV SOLN
2.0000 g | Freq: Once | INTRAVENOUS | Status: AC
  Administered 2024-04-11: 2 g via INTRAVENOUS
  Filled 2024-04-11: qty 20

## 2024-04-11 MED ORDER — AMOXICILLIN-POT CLAVULANATE 875-125 MG PO TABS
1.0000 | ORAL_TABLET | Freq: Two times a day (BID) | ORAL | 0 refills | Status: AC
Start: 2024-04-11 — End: 2024-04-21

## 2024-04-11 MED ORDER — ACETAMINOPHEN 500 MG PO TABS
1000.0000 mg | ORAL_TABLET | Freq: Once | ORAL | Status: AC
  Administered 2024-04-11: 1000 mg via ORAL
  Filled 2024-04-11: qty 2

## 2024-04-11 NOTE — ED Notes (Signed)
 XR at bedside

## 2024-04-11 NOTE — ED Triage Notes (Signed)
 Pt BIB EMS from [facility] for witnessed seizure lasting about 5 min per facility. EMS stating facility did not describe type of seizure. Pt was at breakfast when this happened, fell out of chair and has laceration to left side eyebrow area which has slowed but still oozing. Pt has tremor and dementia at baseline. Vitals 148/94, 126 HR NSR, CBG 131

## 2024-04-11 NOTE — ED Notes (Signed)
 Received handoff from [place], [place]

## 2024-04-11 NOTE — ED Notes (Signed)
 Pt placed on [place] 4L due to O2 sats in the low 80's with Nasal oxygen sensor.

## 2024-04-11 NOTE — Assessment & Plan Note (Signed)
 Noted fall and head laceration status post repair in the ER Appears stable Monitor

## 2024-04-11 NOTE — ED Notes (Signed)
 [facility] [employer] [facility] [employer] called and notified of pt's temperature, now down to 98.6.

## 2024-04-11 NOTE — Assessment & Plan Note (Addendum)
 Acute resp failure with hypoxia  New onset hypoxia with cough and ? Retrocardiac pneumonia on imaging  Initially started on IV Rocephin and azithromycin Noted baseline DNR status on hospice  After discussion with daughter over the phone, she is agreeable for the patient to be transferred back to hospice facility with oxygen to which per the daughter, the hospice nurse can facilitate. Will otherwise transfer discharge planning to Dr. Felipe Horton in the ER.

## 2024-04-11 NOTE — ED Notes (Signed)
 Spoke with Braden, RN of Hospice about getting oxygen set up for pt at her facility. Braden confirmed oxygen being delivered today.

## 2024-04-11 NOTE — ED Notes (Signed)
 Pt taken to CT.

## 2024-04-11 NOTE — ED Notes (Signed)
 Pt returned from CT

## 2024-04-11 NOTE — ED Notes (Signed)
 Pt cleaned of stool and urine, bed linen changed, new brief placed, chuck pad placed. Pt placed in blue scrub pants for DC home. Belongings placed in belongings bag.

## 2024-04-11 NOTE — ED Notes (Signed)
 Pt head cleaned with sterile water and guaze, no other lacerations noted to head at this time.

## 2024-04-11 NOTE — Consult Note (Signed)
 Initial Consultation Note   Patient: Kathryn Morales ZOX:[id number] DOB: 07-Mar-1946 PCP: Cole Daubs, [facility] DOA: 04/11/2024 DOS: the patient was seen and examined on 04/11/2024 Primary service: Arline Bennett, MD  Referring physician: Arline Bennett MD  Reason for consult: PNA, acute resp failure with hypoxia   Assessment/Plan: Assessment and Plan: PNA (pneumonia) Acute resp failure with hypoxia  New onset hypoxia with cough and ? Retrocardiac pneumonia on imaging  Initially started on IV Rocephin and azithromycin Noted baseline DNR status on hospice  After discussion with daughter over the phone, she is agreeable for the patient to be transferred back to hospice facility with oxygen to which per the daughter, the hospice nurse can facilitate. Will otherwise transfer discharge planning to Dr. Felipe Horton in the ER.    Dementia without behavioral disturbance (HCC) DNR  Noted baseline DNR status w/ hospice  Plan of care discussed w/ family  Plan will be for patient to be discharged back to hospice facility with oxygen and antibiotic regimen Plan of care discussed with daughter at length over the phone Can return to the ER if significant decompensation versus comfort measures  Fall Noted fall and head laceration status post repair in the ER Appears stable Monitor       TRH will sign off at present, please call us  again when needed.  HPI: Kathryn Morales is a 78 y.o. female with past medical history of end-stage dementia, hypertension, hyperlipidemia presenting acute respiratory failure with hypoxia, fall, head laceration, pneumonia.  Patient noted to have been recently placed on hospice at living facility.  Per report, patient was noted fall.?  Witnessed seizure for roughly 5 minutes.  Per report, staff staff cannot fully specify event.  Positive laceration to the left eyebrow.  No reported fevers or chills.  No reported cough or shortness of breath.  Noted to have been admitted back  in January for issues including sepsis with intubation as well as pneumonia. Presented to the ER afebrile, heart rate 100s to 180s.  BP stable.  Satting in mid 80s on room air, transition to 4 L nasal cannula to keep O2 sats greater than 94%.  White count 5.3, hemoglobin 12.7, platelets 218, urinalysis within normal limits.  Lactate 4.7, creatinine 0.9.  Patient is daughter and power of attorney Kathryn Morales is currently out of town.  Initial plan was for patient to potentially go back with hospice with oxygen per report.    Review of Systems: As mentioned in the history of present illness. All other systems reviewed and are negative. Past Medical History:  Diagnosis Date   Anxiety    Hyperlipidemia    Hypertension    Past Surgical History:  Procedure Laterality Date   BREAST SURGERY     TONSILLECTOMY AND ADENOIDECTOMY     TUBAL LIGATION     Social History:  reports that she has never smoked. She has never used smokeless tobacco. She reports current alcohol use. She reports that she does not use drugs.  No Known Allergies  Family History  Problem Relation Age of Onset   Coronary artery disease Mother    Heart disease Mother    Lupus Sister    Diabetes Brother    Asthma Maternal Grandfather    Breast cancer Paternal Grandmother    Heart attack Paternal Grandfather    Hypothyroidism Daughter    Anxiety disorder Daughter    Hypothyroidism Maternal Grandmother     Prior to Admission medications   Medication Sig Start Date End  Date Taking? Authorizing Provider  amoxicillin-clavulanate (AUGMENTIN) 875-125 MG tablet Take 1 tablet by mouth 2 (two) times daily for 10 days. 04/11/24 04/21/24 Yes Arline Bennett, MD  ARIPiprazole (ABILIFY) 2 MG tablet Take 1 tablet (2 mg total) by mouth at bedtime. Patient taking differently: Take 2 mg by mouth daily. 06/26/18   Chrismon, Ortencia Blamer, PA-C  azithromycin (ZITHROMAX Z-PAK) 250 MG tablet Take 2 tablets (500 mg) on  Day 1,  followed by 1 tablet (250 mg)  once daily on Days 2 through 5. 04/11/24 04/16/24 Yes Arline Bennett, MD  cholecalciferol (VITAMIN D3) 25 MCG (1000 UNIT) tablet Take 2,000 Units by mouth daily.    [provider]  divalproex (DEPAKOTE SPRINKLE) 125 MG capsule Take 125 mg by mouth 2 (two) times daily. 01/21/24   [provider]  donepezil (ARICEPT) 10 MG tablet Take 1 tablet (10 mg total) by mouth at bedtime. Patient taking differently: Take 10 mg by mouth in the morning. 01/10/17   Chrismon, Ortencia Blamer, PA-C  feeding supplement (ENSURE ENLIVE / ENSURE PLUS) LIQD Take 237 mLs by mouth 2 (two) times daily between meals. 02/09/24   Tiajuana Fluke, MD  hydrochlorothiazide (MICROZIDE) 12.5 MG capsule Take 12.5 mg by mouth daily. 01/21/24   [provider]  melatonin 5 MG TABS Take 5 mg by mouth at bedtime.    [provider]  sertraline (ZOLOFT) 50 MG tablet Take 75 mg by mouth daily. 01/21/24   [provider]  simvastatin (ZOCOR) 40 MG tablet Take 1 tablet (40 mg total) by mouth at bedtime. 07/22/16   ChrismonOrtencia Blamer, PA-C    Physical Exam: Vitals:   04/11/24 0709 04/11/24 0744 04/11/24 0801 04/11/24 0804  BP:      Pulse: 81     Resp: 14     Temp:  (!) 97.5 F (36.4 C)    TempSrc:  Axillary    SpO2: 98%  (!) 81% 94%  Weight: 53 kg     Height: 5\' 2"  (1.575 m)      Physical Exam Constitutional:      Appearance: She is normal weight.  HENT:     Head: Normocephalic and atraumatic.     Nose: Nose normal.     Mouth/Throat:     Mouth: Mucous membranes are moist.  Eyes:     Pupils: Pupils are equal, round, and reactive to light.  Cardiovascular:     Rate and Rhythm: Normal rate and regular rhythm.  Pulmonary:     Effort: Pulmonary effort is normal.     Breath sounds: Rales present.  Abdominal:     General: Bowel sounds are normal.  Musculoskeletal:        General: Normal range of motion.  Skin:    General: Skin is warm.  Neurological:     General: No focal deficit  present.  Psychiatric:        Mood and Affect: Mood normal.     Data Reviewed:   There are no new results to review at this time.  DG Chest Portable 1 View CLINICAL DATA:  Altered mental status, recent pneumonia  EXAM: PORTABLE CHEST 1 VIEW  COMPARISON:  03/30/2024  FINDINGS: Low volume chest with retrocardiac opacification that is nonspecific. Cardiomegaly. No Kerley lines, effusion, or pneumothorax. No acute osseous finding.  IMPRESSION: Atelectasis or infiltrate in the retrocardiac lung.  Electronically Signed   By: Ronnette Coke M.D.   On: 04/11/2024 07:40 CT Cervical Spine Wo Contrast CLINICAL DATA:  Fall.  EXAM: CT HEAD WITHOUT CONTRAST  CT CERVICAL SPINE WITHOUT CONTRAST  TECHNIQUE: Multidetector CT imaging of the head and cervical spine was performed following the standard protocol without intravenous contrast. Multiplanar CT image reconstructions of the cervical spine were also generated.  RADIATION DOSE REDUCTION: This exam was performed according to the departmental dose-optimization program which includes automated exposure control, adjustment of the mA and/or kV according to patient size and/or use of iterative reconstruction technique.  COMPARISON:  03/30/2024 head CT  FINDINGS: CT HEAD FINDINGS  Brain: No evidence of acute infarction, hemorrhage, hydrocephalus, extra-axial collection or mass lesion/mass effect. Advanced brain atrophy. Mild chronic small vessel ischemia in the cerebral white matter  Vascular: No hyperdense vessel or unexpected calcification.  Skull: Large left frontal scalp and forehead hematoma with soft tissue gas tracking down towards the glabella and left nose. No underlying fracture.  Sinuses/Orbits: No evidence of postseptal injury.  No hemosinus.  CT CERVICAL SPINE FINDINGS  Alignment: No traumatic malalignment  Skull base and vertebrae: No acute fracture, but limited by pervasive motion artifact.  Soft  tissues and spinal canal: No prevertebral fluid or swelling. No visible canal hematoma.  Disc levels:  Ordinary degenerative change.  Upper chest: Negative  IMPRESSION: No evidence of acute intracranial or cervical spine injury.  Left forehead hematoma and soft tissue gas.  No underlying fracture.  Motion artifact especially affecting the cervical spine.  Electronically Signed   By: Ronnette Coke M.D.   On: 04/11/2024 07:39 CT HEAD WO CONTRAST ( ) CLINICAL DATA:  Fall.  EXAM: CT HEAD WITHOUT CONTRAST  CT CERVICAL SPINE WITHOUT CONTRAST  TECHNIQUE: Multidetector CT imaging of the head and cervical spine was performed following the standard protocol without intravenous contrast. Multiplanar CT image reconstructions of the cervical spine were also generated.  RADIATION DOSE REDUCTION: This exam was performed according to the departmental dose-optimization program which includes automated exposure control, adjustment of the mA and/or kV according to patient size and/or use of iterative reconstruction technique.  COMPARISON:  03/30/2024 head CT  FINDINGS: CT HEAD FINDINGS  Brain: No evidence of acute infarction, hemorrhage, hydrocephalus, extra-axial collection or mass lesion/mass effect. Advanced brain atrophy. Mild chronic small vessel ischemia in the cerebral white matter  Vascular: No hyperdense vessel or unexpected calcification.  Skull: Large left frontal scalp and forehead hematoma with soft tissue gas tracking down towards the glabella and left nose. No underlying fracture.  Sinuses/Orbits: No evidence of postseptal injury.  No hemosinus.  CT CERVICAL SPINE FINDINGS  Alignment: No traumatic malalignment  Skull base and vertebrae: No acute fracture, but limited by pervasive motion artifact.  Soft tissues and spinal canal: No prevertebral fluid or swelling. No visible canal hematoma.  Disc levels:  Ordinary degenerative change.  Upper chest:  Negative  IMPRESSION: No evidence of acute intracranial or cervical spine injury.  Left forehead hematoma and soft tissue gas.  No underlying fracture.  Motion artifact especially affecting the cervical spine.  Electronically Signed   By: Ronnette Coke M.D.   On: 04/11/2024 07:39  Lab Results  Component Value Date   WBC 5.3 04/11/2024   HGB 12.7 04/11/2024   HCT 38.9 04/11/2024   MCV 100.8 (H) 04/11/2024   PLT 218 04/11/2024   Last metabolic panel Lab Results  Component Value Date   GLUCOSE 136 (H) 04/11/2024   NA 140 04/11/2024   K 3.7 04/11/2024   CL 103 04/11/2024   CO2 26 04/11/2024   BUN 25 (H) 04/11/2024  CREATININE 0.90 04/11/2024   GFRNONAA >60 04/11/2024   CALCIUM 9.6 04/11/2024   PHOS 3.5 02/08/2024   PROT 7.3 04/11/2024   ALBUMIN 4.0 04/11/2024   LABGLOB 1.9 01/10/2017   AGRATIO 2.6 (H) 01/10/2017   BILITOT 0.6 04/11/2024   ALKPHOS 114 04/11/2024   AST 26 04/11/2024   ALT 20 04/11/2024   ANIONGAP 11 04/11/2024      Family Communication: Plan of care discussed with daughter over the phone  Primary team communication: Dr. Felipe Horton made aware of plan from daughter.  Thank you very much for involving us  in the care of your patient.  Author: Corrinne Din, MD 04/11/2024 10:06 AM  For on call review [email or web address].

## 2024-04-11 NOTE — ED Notes (Signed)
 This RN called facility, [facility], to inform about pt temp and meds given.

## 2024-04-11 NOTE — Discharge Instructions (Signed)
 Kathryn Morales has signs of pneumonia and is being discharged on 2 different antibiotics: Augmentin and azithromycin.  Please finish both of these prescriptions.  She is requiring about 4 L oxygen by nasal cannula.  Please continue this oxygen, keep track of her pulse oximetry to ensure adequate oxygenation.  We placed 3 sutures to her forehead, these will absorb on their own.   Gently wash the wound with soap and water.  It is okay to shower, but do not submerge in a bath or go swimming as it is healing.  Do not vigorously scrub.   Gently pat dry.   Once dry, then apply Neosporin or bacitracin or even Vaseline ointment to the area to act as a barrier to help prevent infection.

## 2024-04-11 NOTE — ED Provider Notes (Addendum)
 [facility] Provider Note    Event Date/Time   First MD Initiated Contact with Patient 04/11/24 [phone]     (approximate)   History   Seizures   HPI  Kathryn Morales is a 78 y.o. female who presents to the ED for evaluation of Seizures   I review a medical DC summary from 2/10, admitted for septic shock in setting of pneumonia, mucous plugging, bronchoscopy.  History of dementia, essential tremor resides at local SNF. Recent ED visit on 4/1 for seizure-like activity witnessed at her facility.  Benign workup and ultimately discharged.  Patient returns to the ED after another witnessed seizure-like activity at her facility.  Reportedly sitting upright during breakfast and had an approximate 5-minute long episode generalized tonic-clonic activity with poor responsiveness.  She presents to the ED without seizure activity, oozing laceration to her left forehead.  No antiepileptics or abortive medications provided prehospital.  History is limited, she has advanced dementia and unable to provide any relevant history.  Majority of history is later provided bided by her daughter, Alston Jerry, over the phone.  Patient was just established with hospice care less than 1 week ago.  Alston Jerry is out of town, spring break in [place] .  But patient's son, Jeanette Milks, is also nearby.  She reaffirms DNR status, does not want any invasive procedures such as the lumbar puncture from previous admission in February.  Physical Exam   Triage Vital Signs: ED Triage Vitals  Encounter Vitals Group     BP      Systolic BP Percentile      Diastolic BP Percentile      Pulse      Resp      Temp      Temp src      SpO2      Weight      Height      Head Circumference      Peak Flow      Pain Score      Pain Loc      Pain Education      Exclude from Growth Chart     Most recent vital signs: Vitals:   04/11/24 0801 04/11/24 0804  BP:    Pulse:    Resp:    Temp:    SpO2: (!) 81% 94%     General: no distress.  Disoriented CV:  Good peripheral perfusion.  Resp:  Normal effort.  Requiring supplemental oxygen, 4 to 5 L nasal cannula due to hypoxia on room air Abd:  No distention.  Soft MSK:  No deformity noted.  Palpation of all 4 extremities and her back without other signs of acute trauma, deformity or laceration. Neuro:  No focal deficits appreciated. Other:  Left sided forehead laceration, stellate superior aspect .  About 4 cm in total length, vertically oriented.   ED Results / Procedures / Treatments   Labs (all labs ordered are listed, but only abnormal results are displayed) Labs Reviewed  LACTIC ACID, PLASMA - Abnormal; Notable for the following components:      Result Value   Lactic Acid, Venous 4.7 (*)    All other components within normal limits  COMPREHENSIVE METABOLIC PANEL WITH GFR - Abnormal; Notable for the following components:   Glucose, Bld 136 (*)    BUN 25 (*)    All other components within normal limits  CBC WITH DIFFERENTIAL/PLATELET - Abnormal; Notable for the following components:   RBC 3.86 (*)  MCV 100.8 (*)    Abs Immature Granulocytes 0.09 (*)    All other components within normal limits  URINALYSIS, ROUTINE W REFLEX MICROSCOPIC - Abnormal; Notable for the following components:   Color, Urine YELLOW (*)    APPearance CLEAR (*)    Protein, ur 30 (*)    All other components within normal limits  CULTURE, BLOOD (ROUTINE X 2)  LACTIC ACID, PLASMA    EKG Sinus tachycardia with a rate of 129 bpm.  Tremulous baseline.  Normal axis and intervals without clear signs of acute ischemia.  RADIOLOGY 1 view CXR interpreted by me with left basilar infiltrate CT head interpreted by me without evidence of acute intracranial pathology CT cervical spine interpreted by me without evidence of fracture or dislocation  Official radiology report(s): DG Chest Portable 1 View Result Date: 04/11/2024 CLINICAL DATA:  Altered mental status, recent  pneumonia EXAM: PORTABLE CHEST 1 VIEW COMPARISON:  03/30/2024 FINDINGS: Low volume chest with retrocardiac opacification that is nonspecific. Cardiomegaly. No Kerley lines, effusion, or pneumothorax. No acute osseous finding. IMPRESSION: Atelectasis or infiltrate in the retrocardiac lung. Electronically Signed   By: Ronnette Coke M.D.   On: 04/11/2024 07:40   CT HEAD WO CONTRAST ( ) Result Date: 04/11/2024 CLINICAL DATA:  Fall. EXAM: CT HEAD WITHOUT CONTRAST CT CERVICAL SPINE WITHOUT CONTRAST TECHNIQUE: Multidetector CT imaging of the head and cervical spine was performed following the standard protocol without intravenous contrast. Multiplanar CT image reconstructions of the cervical spine were also generated. RADIATION DOSE REDUCTION: This exam was performed according to the departmental dose-optimization program which includes automated exposure control, adjustment of the mA and/or kV according to patient size and/or use of iterative reconstruction technique. COMPARISON:  03/30/2024 head CT FINDINGS: CT HEAD FINDINGS Brain: No evidence of acute infarction, hemorrhage, hydrocephalus, extra-axial collection or mass lesion/mass effect. Advanced brain atrophy. Mild chronic small vessel ischemia in the cerebral white matter Vascular: No hyperdense vessel or unexpected calcification. Skull: Large left frontal scalp and forehead hematoma with soft tissue gas tracking down towards the glabella and left nose. No underlying fracture. Sinuses/Orbits: No evidence of postseptal injury.  No hemosinus. CT CERVICAL SPINE FINDINGS Alignment: No traumatic malalignment Skull base and vertebrae: No acute fracture, but limited by pervasive motion artifact. Soft tissues and spinal canal: No prevertebral fluid or swelling. No visible canal hematoma. Disc levels:  Ordinary degenerative change. Upper chest: Negative IMPRESSION: No evidence of acute intracranial or cervical spine injury. Left forehead hematoma and soft tissue gas.   No underlying fracture. Motion artifact especially affecting the cervical spine. Electronically Signed   By: Ronnette Coke M.D.   On: 04/11/2024 07:39   CT Cervical Spine Wo Contrast Result Date: 04/11/2024 CLINICAL DATA:  Fall. EXAM: CT HEAD WITHOUT CONTRAST CT CERVICAL SPINE WITHOUT CONTRAST TECHNIQUE: Multidetector CT imaging of the head and cervical spine was performed following the standard protocol without intravenous contrast. Multiplanar CT image reconstructions of the cervical spine were also generated. RADIATION DOSE REDUCTION: This exam was performed according to the departmental dose-optimization program which includes automated exposure control, adjustment of the mA and/or kV according to patient size and/or use of iterative reconstruction technique. COMPARISON:  03/30/2024 head CT FINDINGS: CT HEAD FINDINGS Brain: No evidence of acute infarction, hemorrhage, hydrocephalus, extra-axial collection or mass lesion/mass effect. Advanced brain atrophy. Mild chronic small vessel ischemia in the cerebral white matter Vascular: No hyperdense vessel or unexpected calcification. Skull: Large left frontal scalp and forehead hematoma with soft tissue gas tracking down towards  the glabella and left nose. No underlying fracture. Sinuses/Orbits: No evidence of postseptal injury.  No hemosinus. CT CERVICAL SPINE FINDINGS Alignment: No traumatic malalignment Skull base and vertebrae: No acute fracture, but limited by pervasive motion artifact. Soft tissues and spinal canal: No prevertebral fluid or swelling. No visible canal hematoma. Disc levels:  Ordinary degenerative change. Upper chest: Negative IMPRESSION: No evidence of acute intracranial or cervical spine injury. Left forehead hematoma and soft tissue gas.  No underlying fracture. Motion artifact especially affecting the cervical spine. Electronically Signed   By: Ronnette Coke M.D.   On: 04/11/2024 07:39    PROCEDURES and INTERVENTIONS:  .Critical  Care  Performed by: Arline Bennett, MD Authorized by: Arline Bennett, MD   Critical care provider statement:    Critical care time (minutes):  30   Critical care time was exclusive of:  Separately billable procedures and treating other patients   Critical care was necessary to treat or prevent imminent or life-threatening deterioration of the following conditions:  Respiratory failure   Critical care was time spent personally by me on the following activities:  Development of treatment plan with patient or surrogate, discussions with consultants, evaluation of patient's response to treatment, examination of patient, ordering and review of laboratory studies, ordering and review of radiographic studies, ordering and performing treatments and interventions, pulse oximetry, re-evaluation of patient's condition and review of old charts .1-3 Lead EKG Interpretation  Performed by: Arline Bennett, MD Authorized by: Arline Bennett, MD     Interpretation: abnormal     ECG rate:  120   ECG rate assessment: tachycardic     Rhythm: sinus tachycardia     Ectopy: none     Conduction: normal   .Laceration Repair  Date/Time: 04/11/2024 8:55 AM  Performed by: Arline Bennett, MD Authorized by: Arline Bennett, MD   Consent:    Consent obtained:  Verbal   Consent given by:  Healthcare agent   Risks, benefits, and alternatives were discussed: yes   Anesthesia:    Anesthesia method:  Local infiltration   Local anesthetic:  Lidocaine 1% w/o epi Laceration details:    Location:  Face   Face location:  Forehead   Length (cm):  4 Exploration:    Hemostasis achieved with:  Direct pressure   Imaging outcome: foreign body not noted   Treatment:    Area cleansed with:  Povidone-iodine   Amount of cleaning:  Standard   Irrigation solution:  Sterile saline Skin repair:    Repair method:  Sutures   Suture size:  2-0   Wound skin closure material used: vicryl rapide.   Suture technique: stellate x1, horizontal  mattress x1, simple interrupted x1.   Number of sutures:  3 Approximation:    Approximation:  Close Repair type:    Repair type:  Intermediate Post-procedure details:    Dressing:  Antibiotic ointment   Procedure completion:  Tolerated well, no immediate complications   Medications  cefTRIAXone (ROCEPHIN) 2 g in sodium chloride 0.9 % 100 mL IVPB (has no administration in time range)  azithromycin (ZITHROMAX) 500 mg in sodium chloride 0.9 % 250 mL IVPB (500 mg Intravenous New Bag/Given 04/11/24 0913)  lactated ringers bolus 1,000 mL (1,000 mLs Intravenous New Bag/Given 04/11/24 0741)  lidocaine (PF) (XYLOCAINE) 1 % injection 10 mL (10 mLs Infiltration Given 04/11/24 0742)  bacitracin ointment (1 Application Topical Given 04/11/24 0913)     IMPRESSION / MDM / ASSESSMENT AND PLAN / ED COURSE  I reviewed the  triage vital signs and the nursing notes.  Differential diagnosis includes, but is not limited to, seizure, status epilepticus, cardiac dysrhythmia or other syncope, metabolic encephalopathy  {Patient presents with symptoms of an acute illness or injury that is potentially life-threatening.  Patient presents from local facility after seizure-like episode causing a fall and forehead laceration, found to be hypoxic with signs of pneumonia and ultimately requiring medical admission.  Hypoxic to the low 80s requiring 4-5 L nasal cannula.  Initially tachycardic resolving with IV fluids.  Remained stable without signs of shock.  Laceration to left-sided forehead without additional signs of trauma.  Repaired by me.  Blood work with lactic acidosis but normal CBC and metabolic panel.  Imaging with retrocardiac opacity.  As below, long conversation with patient's daughter on the phone and we ultimately decide to try to facilitate a brief medical admission and then return to facility with continued hospice care.  They would like medications provided such as IV fluids and antibiotics but no  more invasive procedures or more invasive oxygen delivery methods.  Addendum to add that I was updated by hospitalist that family changed their mind again and would like the patient discharged back to facility.  We will work on arranging oral outpatient antibiotics after she finishes her IV antibiotics.  We will arrange with nursing staff and hospice nurses at her facility for supplemental oxygen.  After these are arranged we will provide transport back to her facility.  Clinical Course as of 04/11/24 1006  Sun Apr 11, 2024  4098 Apply subcut lido to her forehead lac with RN assistance [DS]  [phone] I repaired moderately complex laceration to patient's left forehead.  Superior portion of stellate requiring single stellate suture, 1 horizontal mattress and 1 simple interrupted of 2-0 Vicryl Rapide [DS]  0837 While in the room, patient's hospice RN representative is on the bedside to provide some supplemental history.  He is not too familiar with the patient, this is his first time meeting her.  Patient was just enrolled in hospice this past 1 week.  I did talk with her daughter, Alston Jerry, over the phone who provides majority of history.  Just enrolled in hospice this week, they are uncertain what to do today.  We discussed presenting seizure-like activity, signs of hypoxia, possibly recurrence of pneumonia we discussed possibilities of plan of care including returning to facility with supplemental oxygen and oral antibiotics.  Ultimately we decided to do a medical admission here for IV antibiotics [DS]  [phone] Consult medicine who agrees to admit. [DS]  1003 Admitting hospitalist reaches back out to me and reports another long conversation with the daughter who has since changed her mind and now requesting discharge back to the facility to continue care there, they can arrange supplemental oxygen. [DS]    Clinical Course User Index [DS] Arline Bennett, MD     FINAL CLINICAL IMPRESSION(S) / ED DIAGNOSES    Final diagnoses:  Seizure-like activity (HCC)  Forehead laceration, initial encounter  Hypoxia  Community acquired pneumonia of left lower lobe of lung     Rx / DC Orders   ED Discharge Orders          Ordered    amoxicillin-clavulanate (AUGMENTIN) 875-125 MG tablet  2 times daily        04/11/24 1006    azithromycin (ZITHROMAX Z-PAK) 250 MG tablet        04/11/24 1006             Note:  This document was prepared using Dragon voice recognition software and may include unintentional dictation errors.   Arline Bennett, MD 04/11/24 4696    Arline Bennett, MD 04/11/24 1006

## 2024-04-11 NOTE — Assessment & Plan Note (Addendum)
 DNR  Noted baseline DNR status w/ hospice  Plan of care discussed w/ family  Plan will be for patient to be discharged back to hospice facility with oxygen and antibiotic regimen Plan of care discussed with daughter at length over the phone Can return to the ER if significant decompensation versus comfort measures

## 2024-04-11 NOTE — ED Notes (Signed)
 Pt answering to her name but not answering any questions at this time.

## 2024-04-16 LAB — CULTURE, BLOOD (ROUTINE X 2)
Culture: NO GROWTH
Special Requests: ADEQUATE
